# Patient Record
Sex: Female | Born: 1960
Health system: Southern US, Community
[De-identification: ages and names within clinical notes are randomized; demographics above are authoritative.]

## PROBLEM LIST (undated history)

## (undated) DIAGNOSIS — R002 Palpitations: Secondary | ICD-10-CM

## (undated) DIAGNOSIS — R001 Bradycardia, unspecified: Secondary | ICD-10-CM

## (undated) DIAGNOSIS — E785 Hyperlipidemia, unspecified: Secondary | ICD-10-CM

## (undated) DIAGNOSIS — Z9289 Personal history of other medical treatment: Secondary | ICD-10-CM

## (undated) DIAGNOSIS — I10 Essential (primary) hypertension: Secondary | ICD-10-CM

## (undated) HISTORY — DX: Bradycardia, unspecified: R00.1

## (undated) HISTORY — DX: Personal history of other medical treatment: Z92.89

## (undated) HISTORY — DX: Hyperlipidemia, unspecified: E78.5

## (undated) HISTORY — DX: Palpitations: R00.2

## (undated) HISTORY — DX: Essential (primary) hypertension: I10

---

## 1982-05-03 HISTORY — PX: WISDOM TOOTH EXTRACTION: SHX21

## 2011-10-04 ENCOUNTER — Emergency Department: Payer: Self-pay | Admitting: Unknown Physician Specialty

## 2011-10-04 LAB — COMPREHENSIVE METABOLIC PANEL
Albumin: 3.9 g/dL (ref 3.4–5.0)
Alkaline Phosphatase: 90 U/L (ref 50–136)
Anion Gap: 9 (ref 7–16)
Bilirubin,Total: 0.3 mg/dL (ref 0.2–1.0)
Calcium, Total: 9 mg/dL (ref 8.5–10.1)
Chloride: 101 mmol/L (ref 98–107)
Co2: 28 mmol/L (ref 21–32)
EGFR (African American): >60
EGFR (Non-African Amer.): >60
Glucose: 232 mg/dL — ABNORMAL HIGH (ref 65–99)
Potassium: 3.9 mmol/L (ref 3.5–5.1)
SGPT (ALT): 29 U/L
Sodium: 138 mmol/L (ref 136–145)
Total Protein: 7.5 g/dL (ref 6.4–8.2)

## 2011-10-04 LAB — CBC
HCT: 40.6 % (ref 35.0–47.0)
MCH: 30.6 pg (ref 26.0–34.0)
MCHC: 34.6 g/dL (ref 32.0–36.0)
Platelet: 264 10*3/uL (ref 150–440)
RDW: 12.7 % (ref 11.5–14.5)
WBC: 8 10*3/uL (ref 3.6–11.0)

## 2011-10-04 LAB — TROPONIN I: Troponin-I: <0.02 ng/mL

## 2011-10-05 LAB — MAGNESIUM: Magnesium: 1.8 mg/dL

## 2011-10-20 ENCOUNTER — Encounter: Payer: Self-pay | Admitting: Cardiovascular Disease

## 2011-10-20 ENCOUNTER — Encounter: Payer: Self-pay | Admitting: *Deleted

## 2011-10-27 ENCOUNTER — Encounter: Payer: Self-pay | Admitting: Cardiovascular Disease

## 2011-10-27 ENCOUNTER — Ambulatory Visit (INDEPENDENT_AMBULATORY_CARE_PROVIDER_SITE_OTHER): Payer: BC Managed Care – PPO | Admitting: Cardiovascular Disease

## 2011-10-27 VITALS — BP 140/88 | HR 74 | Ht 65.5 in | Wt 178.0 lb

## 2011-10-27 DIAGNOSIS — R002 Palpitations: Secondary | ICD-10-CM

## 2011-10-27 DIAGNOSIS — Z0000 Encounter for general adult medical examination without abnormal findings: Secondary | ICD-10-CM

## 2011-10-27 DIAGNOSIS — E119 Type 2 diabetes mellitus without complications: Secondary | ICD-10-CM | POA: Insufficient documentation

## 2011-10-27 DIAGNOSIS — R079 Chest pain, unspecified: Secondary | ICD-10-CM

## 2011-10-27 DIAGNOSIS — I10 Essential (primary) hypertension: Secondary | ICD-10-CM

## 2011-10-27 MED ORDER — LISINOPRIL 20 MG PO TABS: 20.0000 mg | ORAL_TABLET | Freq: Every day | ORAL | Status: DC

## 2011-10-27 NOTE — Assessment & Plan Note (Signed)
She has several risk factors for coronary artery disease including diabetes, hypertension. Cholesterol unknown but she does have family history. We have discussed the various treatment options with her given her episodes of chest discomfort at the beginning of the month. She has elected to proceed with a routine treadmill study. We will schedule this for her at her convenience.

## 2011-10-27 NOTE — Assessment & Plan Note (Signed)
She denies any recent palpitations. We have asked her to contact our office if she has additional symptoms. Suspect she may have had ectopy.

## 2011-10-27 NOTE — Assessment & Plan Note (Signed)
We have encouraged continued exercise, careful diet management in an effort to lose weight. We have given her a dietary guide to follow.

## 2011-10-27 NOTE — Progress Notes (Signed)
Patient ID: Melanie Navarro, female    DOB: 1960/06/11, 51 y.o.   MRN: [id number]  HPI Comments: Melanie Navarro is a very pleasant 51 year old woman with a history of diabetes that is poorly controlled, hypertension, family history of heart disease with recent symptoms of fatigue, palpitations, hand swelling, episode of chest pain 10/04/2011. She went to the emergency room for evaluation. Blood pressure at that time was in the 200 systolic range.  She has been working closely with Dr. Elease Hashimoto on her blood pressure. She was recently started on lisinopril 10 mg daily metoprolol succinate 25 mg daily. She reports having some fatigue. Blood pressure continues to be mildly elevated by her report though significantly better.  She had symptoms of chest tightness and palpitations in the emergency room at the beginning of the month. She denies any further symptoms since that time. She has been walking on a regular basis around the block several times in succession with no reproducible chest pain symptoms. She reports having trouble with her diabetes. She is trying to watch what she eats.   Details of her family history are not clear but she does report her parents were smokers and have heart problems.  Lab work in the hospital shows normal CBC, normal cardiac enzymes times one, normal renal function, glucose 232, normal LFTs were normal chest x-ray, normal CT scan done for dizziness No lipid panel is available  EKG in the hospital shows normal sinus rhythm with rate 71 beats per minute with no significant ST or T wave changes  EKG today shows normal sinus rhythm with no significant ST or T wave changes     Outpatient Encounter Prescriptions as of 10/27/2011  Medication Sig Dispense Refill  . aspirin 81 MG tablet Take 81 mg by mouth daily.      Marland Kitchen lisinopril (PRINIVIL,ZESTRIL) 10 MG tablet Take 1 tablet (10 mg total) by mouth daily.  30 tablet  11  . metFORMIN (GLUCOPHAGE) 500 MG tablet Take 500 mg by mouth 2  (two) times daily with a meal.      . metoprolol succinate (TOPROL-XL) 25 MG 24 hr tablet Take 25 mg by mouth daily.       Review of Systems  Constitutional: Negative.   HENT: Negative.   Eyes: Negative.   Respiratory: Negative.   Cardiovascular: Positive for chest pain and palpitations.  Gastrointestinal: Negative.   Musculoskeletal: Negative.   Skin: Negative.   Neurological: Negative.   Hematological: Negative.   Psychiatric/Behavioral: Negative.   All other systems reviewed and are negative.    BP 140/88  Pulse 74  Ht 5' 5.5" (1.664 m)  Wt 178 lb (80.74 kg)  BMI 29.17 kg/m2  Physical Exam  Nursing note and vitals reviewed. Constitutional: She is oriented to person, place, and time. She appears well-developed and well-nourished.  HENT:  Head: Normocephalic.  Nose: Nose normal.  Mouth/Throat: Oropharynx is clear and moist.  Eyes: Conjunctivae are normal. Pupils are equal, round, and reactive to light.  Neck: Normal range of motion. Neck supple. No JVD present.  Cardiovascular: Normal rate, regular rhythm, S1 normal, S2 normal, normal heart sounds and intact distal pulses.  Exam reveals no gallop and no friction rub.   No murmur heard. Pulmonary/Chest: Effort normal and breath sounds normal. No respiratory distress. She has no wheezes. She has no rales. She exhibits no tenderness.  Abdominal: Soft. Bowel sounds are normal. She exhibits no distension. There is no tenderness.  Musculoskeletal: Normal range of motion. She exhibits no  edema and no tenderness.  Lymphadenopathy:    She has no cervical adenopathy.  Neurological: She is alert and oriented to person, place, and time. Coordination normal.  Skin: Skin is warm and dry. No rash noted. No erythema.  Psychiatric: She has a normal mood and affect. Her behavior is normal. Judgment and thought content normal.         Assessment and Plan

## 2011-10-27 NOTE — Patient Instructions (Addendum)
You are doing well. Please take lisinopril 20 mg in the AM Take metoprolol in the PM  We will schedule you for a treadmill test for chest tightness, palpitations  Please call us if you have new issues that need to be addressed before your next appt.  Your physician wants you to follow-up in: 12 months.  You will receive a reminder letter in the mail two months in advance. If you don't receive a letter, please call our office to schedule the follow-up appointment.

## 2011-10-27 NOTE — Assessment & Plan Note (Signed)
We discussed weight loss, exercise, strict cholesterol given her diabetes. She is scheduled to see Dr. Elease Hashimoto and we have suggested she have a cholesterol panel on her routine annual visit. Goal LDL less than 70.

## 2011-10-27 NOTE — Assessment & Plan Note (Signed)
Blood pressure is mildly elevated today. We have suggested she increase her lisinopril to 20 mg in the morning, take her metoprolol in the evening given her fatigue which is likely from her metoprolol. She has followup with Dr. Elease Hashimoto in the near future.

## 2011-11-17 ENCOUNTER — Encounter: Payer: BC Managed Care – PPO | Admitting: Cardiovascular Disease

## 2014-07-08 ENCOUNTER — Ambulatory Visit: Payer: Self-pay | Admitting: Nurse Practitioner

## 2014-07-08 ENCOUNTER — Encounter (INDEPENDENT_AMBULATORY_CARE_PROVIDER_SITE_OTHER): Payer: Self-pay

## 2014-07-15 ENCOUNTER — Ambulatory Visit (INDEPENDENT_AMBULATORY_CARE_PROVIDER_SITE_OTHER): Payer: BLUE CROSS/BLUE SHIELD | Admitting: Nurse Practitioner

## 2014-07-15 ENCOUNTER — Encounter: Payer: Self-pay | Admitting: Nurse Practitioner

## 2014-07-15 VITALS — BP 180/90 | HR 83 | Temp 98.1°F | Resp 12 | Ht 65.5 in | Wt 164.8 lb

## 2014-07-15 DIAGNOSIS — I10 Essential (primary) hypertension: Secondary | ICD-10-CM

## 2014-07-15 DIAGNOSIS — Z1239 Encounter for other screening for malignant neoplasm of breast: Secondary | ICD-10-CM

## 2014-07-15 DIAGNOSIS — Z1211 Encounter for screening for malignant neoplasm of colon: Secondary | ICD-10-CM

## 2014-07-15 DIAGNOSIS — E119 Type 2 diabetes mellitus without complications: Secondary | ICD-10-CM

## 2014-07-15 DIAGNOSIS — Z7689 Persons encountering health services in other specified circumstances: Secondary | ICD-10-CM

## 2014-07-15 DIAGNOSIS — Z7189 Other specified counseling: Secondary | ICD-10-CM

## 2014-07-15 LAB — BASIC METABOLIC PANEL
BUN: 15 mg/dL (ref 6–23)
CALCIUM: 9.6 mg/dL (ref 8.4–10.5)
CO2: 30 meq/L (ref 19–32)
CREATININE: 0.55 mg/dL (ref 0.40–1.20)
Chloride: 101 mEq/L (ref 96–112)
GFR: 122.06 mL/min (ref >60.00)
GLUCOSE: 223 mg/dL — AB (ref 70–99)
Potassium: 4.2 mEq/L (ref 3.5–5.1)
Sodium: 137 mEq/L (ref 135–145)

## 2014-07-15 LAB — HEMOGLOBIN A1C: HEMOGLOBIN A1C: 9.2 % — AB (ref 4.6–6.5)

## 2014-07-15 MED ORDER — LOSARTAN POTASSIUM 50 MG PO TABS: 50.0000 mg | ORAL_TABLET | Freq: Every day | ORAL | Status: DC

## 2014-07-15 MED ORDER — CANAGLIFLOZIN 100 MG PO TABS: 100.0000 mg | ORAL_TABLET | Freq: Every day | ORAL | Status: DC

## 2014-07-15 NOTE — Progress Notes (Signed)
Subjective:    Patient ID: Melanie Navarro, female    DOB: 11-Jan-1961, 54 y.o.   MRN: [id number]  HPI  Melanie Navarro is a 54 yo female establishing care today with CC of HTN.  1) Health Maintenance-   Diet- Eats at home, cutting down on salt   Exercise- No formal   Immunizations- UTD  Mammogram- Never  Pap- Unsure, no irregulars in past   Colonoscopy- Never   Eye Exam- 2 years ago, normal   Dental Exam- UTD  2) Chronic Problems-  Chiropractor for back pain- acupuncture, improving, intermittent    2 ibuprofen as needed. Sitting up top of shin and groin area.   Diabetes Type II- Metformin daily,   3) Acute Problems-  HTN- Saw Dr. Venia Minks same medications for awhile and it is not working. Dry cough, machine is broken at home. 2 months ago went to chiropractor and had it checked.    Review of Systems  Constitutional: Negative for fever, chills, diaphoresis, fatigue and unexpected weight change.  HENT: Negative for tinnitus and trouble swallowing.   Eyes: Negative for visual disturbance.  Respiratory: Negative for cough, chest tightness, shortness of breath and wheezing.   Cardiovascular: Negative for chest pain, palpitations and leg swelling.  Gastrointestinal: Negative for nausea, vomiting, abdominal pain, diarrhea, constipation and blood in stool.  Endocrine: Negative for polydipsia, polyphagia and polyuria.  Genitourinary: Negative for dysuria, hematuria, vaginal discharge and vaginal pain.  Musculoskeletal: Positive for back pain. Negative for myalgias, arthralgias and gait problem.  Skin: Negative for color change and rash.  Neurological: Negative for dizziness, weakness, numbness and headaches.  Hematological: Does not bruise/bleed easily.  Psychiatric/Behavioral: Negative for suicidal ideas and sleep disturbance. The patient is not nervous/anxious.    Past Medical History  Diagnosis Date  . Diabetes mellitus   . Hypertension     History   Social History  . Marital  Status: Married    Spouse Name: N/A  . Number of Children: N/A  . Years of Education: N/A   Occupational History  . Not on file.   Social History Main Topics  . Smoking status: Never Smoker   . Smokeless tobacco: Not on file  . Alcohol Use: 0.5 oz/week    1 Standard drinks or equivalent per week     Comment: social  . Drug Use: No  . Sexual Activity: Not on file   Other Topics Concern  . Not on file   Social History Narrative    History reviewed. No pertinent past surgical history.  Family History  Problem Relation Age of Onset  . Heart attack Mother   . Hypertension Mother   . Heart disease Mother   . Diabetes Mother   . Diabetes Father   . Cancer Father     Lung  . Diabetes Brother     Allergies  Allergen Reactions  . Penicillins     Rash   . Sulfur     Rash     Current Outpatient Prescriptions on File Prior to Visit  Medication Sig Dispense Refill  . aspirin 81 MG tablet Take 81 mg by mouth daily.    . metFORMIN (GLUCOPHAGE) 500 MG tablet Take 500 mg by mouth 2 (two) times daily with a meal.    . metoprolol succinate (TOPROL-XL) 25 MG 24 hr tablet Take 25 mg by mouth daily.     No current facility-administered medications on file prior to visit.      Objective:   Physical  Exam  Constitutional: She is oriented to person, place, and time. She appears well-developed and well-nourished. No distress.  BP 180/90 mmHg  Pulse 83  Temp(Src) 98.1 F (36.7 C)  Resp 12  Ht 5' 5.5" (1.664 m)  Wt 164 lb 12.8 oz (74.753 kg)  BMI 27.00 kg/m2  SpO2 97%   HENT:  Head: Normocephalic and atraumatic.  Right Ear: External ear normal.  Left Ear: External ear normal.  Eyes: EOM are normal. Pupils are equal, round, and reactive to light. Right eye exhibits no discharge. Left eye exhibits no discharge. No scleral icterus.  Neck: Normal range of motion. Neck supple. No thyromegaly present.  Cardiovascular: Normal rate, regular rhythm, normal heart sounds and intact  distal pulses.  Exam reveals no gallop and no friction rub.   No murmur heard. Pulmonary/Chest: Effort normal and breath sounds normal. No respiratory distress. She has no wheezes. She has no rales. She exhibits no tenderness.  Musculoskeletal: Normal range of motion. She exhibits no edema or tenderness.  Lymphadenopathy:    She has no cervical adenopathy.  Neurological: She is alert and oriented to person, place, and time. No cranial nerve deficit. She exhibits normal muscle tone. Coordination normal.  Skin: Skin is warm and dry. No rash noted. She is not diaphoretic.  Psychiatric: She has a normal mood and affect. Her behavior is normal. Judgment and thought content normal.      Assessment & Plan:

## 2014-07-15 NOTE — Progress Notes (Signed)
Pre visit review using our clinic review tool, if applicable. No additional management support is needed unless otherwise documented below in the visit note. 

## 2014-07-15 NOTE — Patient Instructions (Addendum)
Take Invokana with first meal of the day (okay to take with Metformin).   Follow up in 3 months on BP and diabetes.  Follow up end of this week beginning of next week for BP (either call it in or come for nurse visit).   Losartan 50 mg at night instead of the lisinopril.   Please visit the lab before leaving today.

## 2014-07-16 DIAGNOSIS — Z7689 Persons encountering health services in other specified circumstances: Secondary | ICD-10-CM | POA: Insufficient documentation

## 2014-07-16 NOTE — Assessment & Plan Note (Signed)
Discussed acute and chronic issues. Reviewed health maintenance measures, PFSHx, and immunizations. Referral to GI for colonoscopy and for mammogram.

## 2014-07-16 NOTE — Assessment & Plan Note (Signed)
Take Invokana with first meal of the day (okay to take with Metformin). Will obtain A1c.

## 2014-07-16 NOTE — Assessment & Plan Note (Signed)
Worsening. Asked pt to start Losartan 50 mg at night instead of lisinopril (losartan lower risk of dry cough). Follow up in 3 months on BP and diabetes. Will obtain BMET today.

## 2014-07-17 ENCOUNTER — Encounter: Payer: Self-pay | Admitting: Nurse Practitioner

## 2014-07-18 ENCOUNTER — Other Ambulatory Visit: Payer: Self-pay | Admitting: Nurse Practitioner

## 2014-07-18 MED ORDER — CYCLOBENZAPRINE HCL 5 MG PO TABS: 5.0000 mg | ORAL_TABLET | Freq: Every day | ORAL | Status: DC

## 2014-08-05 ENCOUNTER — Ambulatory Visit (INDEPENDENT_AMBULATORY_CARE_PROVIDER_SITE_OTHER): Payer: BLUE CROSS/BLUE SHIELD | Admitting: Nurse Practitioner

## 2014-08-05 ENCOUNTER — Encounter: Payer: Self-pay | Admitting: Nurse Practitioner

## 2014-08-05 VITALS — BP 150/90 | HR 90 | Temp 98.0°F | Resp 12 | Ht 65.5 in | Wt 160.4 lb

## 2014-08-05 DIAGNOSIS — M545 Low back pain, unspecified: Secondary | ICD-10-CM | POA: Insufficient documentation

## 2014-08-05 DIAGNOSIS — M5441 Lumbago with sciatica, right side: Secondary | ICD-10-CM | POA: Diagnosis not present

## 2014-08-05 MED ORDER — MELOXICAM 15 MG PO TABS: 15.0000 mg | ORAL_TABLET | Freq: Every day | ORAL | Status: DC

## 2014-08-05 NOTE — Patient Instructions (Signed)
Try the meloxicam daily for 2 weeks. This will help decrease inflammation.   Stop the medication if it is making your stomach upset.   We will contact you with your results of your back x-ray.   [facility] Address:  Address: [address] [place], [place] [address]  [phone] X-rays until 4 pm

## 2014-08-05 NOTE — Progress Notes (Addendum)
Subjective:    Patient ID: Melanie Navarro, female    DOB: 1961-04-11, 54 y.o.   MRN: [id number]  HPI  [name] is a 54 yo female with back pain.   1) Back pain started before Christmas  Groin right side, right buttock, right lower back   Walking often- 3 x a week over a mile   Lying flat was worse, standing   Best position sitting   Dull aching pain   8/10- worst, 3/10   Constant  Flexeril- helpful  Advil- helpful   Review of Systems  Constitutional: Negative for fever, chills, diaphoresis and fatigue.  Musculoskeletal: Positive for back pain. Negative for myalgias, joint swelling, arthralgias, gait problem, neck pain and neck stiffness.   Past Medical History  Diagnosis Date  . Diabetes mellitus   . Hypertension     History   Social History  . Marital Status: Married    Spouse Name: N/A  . Number of Children: N/A  . Years of Education: N/A   Occupational History  . Not on file.   Social History Main Topics  . Smoking status: Never Smoker   . Smokeless tobacco: Not on file  . Alcohol Use: 0.5 oz/week    1 Standard drinks or equivalent per week     Comment: social  . Drug Use: No  . Sexual Activity: Not on file   Other Topics Concern  . Not on file   Social History Narrative    No past surgical history on file.  Family History  Problem Relation Age of Onset  . Heart attack Mother   . Hypertension Mother   . Heart disease Mother   . Diabetes Mother   . Diabetes Father   . Cancer Father     Lung  . Diabetes Brother     Allergies  Allergen Reactions  . Penicillins     Rash   . Sulfur     Rash     Current Outpatient Prescriptions on File Prior to Visit  Medication Sig Dispense Refill  . aspirin 81 MG tablet Take 81 mg by mouth daily.    . canagliflozin (INVOKANA) 100 MG TABS tablet Take 1 tablet (100 mg total) by mouth daily. 30 tablet 2  . cyclobenzaprine (FLEXERIL) 5 MG tablet Take 1 tablet (5 mg total) by mouth at bedtime. 30 tablet 0    . losartan (COZAAR) 50 MG tablet Take 1 tablet (50 mg total) by mouth daily. 30 tablet 2  . metFORMIN (GLUCOPHAGE) 500 MG tablet Take 500 mg by mouth 2 (two) times daily with a meal.    . metoprolol succinate (TOPROL-XL) 25 MG 24 hr tablet Take 25 mg by mouth daily.     No current facility-administered medications on file prior to visit.      Objective:   Physical Exam  Constitutional: She is oriented to person, place, and time. She appears well-developed and well-nourished. No distress.  BP 150/90 mmHg  Pulse 90  Temp(Src) 98 F (36.7 C) (Oral)  Resp 12  Ht 5' 5.5" (1.664 m)  Wt 160 lb 6.4 oz (72.757 kg)  BMI 26.28 kg/m2  SpO2 96%   HENT:  Head: Normocephalic and atraumatic.  Right Ear: External ear normal.  Left Ear: External ear normal.  Cardiovascular: Normal rate, regular rhythm, normal heart sounds and intact distal pulses.  Exam reveals no gallop and no friction rub.   No murmur heard. Pulmonary/Chest: Effort normal and breath sounds normal. No respiratory distress.  She has no wheezes. She has no rales. She exhibits no tenderness.  Musculoskeletal:       Arms: Neurological: She is alert and oriented to person, place, and time. No cranial nerve deficit. She exhibits normal muscle tone. Coordination normal.  Skin: Skin is warm and dry. No rash noted. She is not diaphoretic.  Psychiatric: She has a normal mood and affect. Her behavior is normal. Judgment and thought content normal.      Assessment & Plan:

## 2014-08-05 NOTE — Assessment & Plan Note (Signed)
Right side back pain has moved from outer thigh to groin. Will obtain x-ray. Will try Meloxicam 15 mg daily for 2 weeks. Asked her to stop it if making stomach upset and not to take with other NSAIDs (gave examples). Will follow after x-ray.

## 2014-08-05 NOTE — Progress Notes (Signed)
Pre visit review using our clinic review tool, if applicable. No additional management support is needed unless otherwise documented below in the visit note. 

## 2014-08-12 ENCOUNTER — Ambulatory Visit (INDEPENDENT_AMBULATORY_CARE_PROVIDER_SITE_OTHER): Payer: BLUE CROSS/BLUE SHIELD | Admitting: Internal Medicine

## 2014-08-12 ENCOUNTER — Encounter: Payer: Self-pay | Admitting: Internal Medicine

## 2014-08-12 VITALS — BP 116/72 | HR 100 | Temp 98.6°F | Resp 16 | Ht 65.5 in | Wt 158.0 lb

## 2014-08-12 DIAGNOSIS — J020 Streptococcal pharyngitis: Secondary | ICD-10-CM | POA: Diagnosis not present

## 2014-08-12 DIAGNOSIS — B349 Viral infection, unspecified: Secondary | ICD-10-CM | POA: Diagnosis not present

## 2014-08-12 LAB — POCT RAPID STREP A (OFFICE): RAPID STREP A SCREEN: NEGATIVE

## 2014-08-12 MED ORDER — PREDNISONE 10 MG PO TABS: ORAL_TABLET | ORAL | Status: DC

## 2014-08-12 MED ORDER — LEVOFLOXACIN 500 MG PO TABS: 500.0000 mg | ORAL_TABLET | Freq: Every day | ORAL | Status: DC

## 2014-08-12 NOTE — Progress Notes (Signed)
Patient ID: Melanie Navarro, female   DOB: Apr 24, 1961, 54 y.o.   MRN: [id number]   Patient Active Problem List   Diagnosis Date Noted  . Acute viral syndrome 08/13/2014  . Low back pain 08/05/2014  . Encounter to establish care 07/16/2014  . Chest pain 10/27/2011  . Palpitations 10/27/2011  . Hypertension 10/27/2011  . Encounter for preventive health examination 10/27/2011  . Diabetes mellitus 10/27/2011    Subjective:  CC:   Chief Complaint  Patient presents with  . Cough    X 2 days started on saturday with sorethroat, chills low grade fever patient states.  . Sore Throat  . Sinusitis    sinus drainage causes patient to cough.  . Otalgia    bilateral    HPI:   Melanie Navarro is a 54 y.o. female who presents for Woke up [place], symptoms have goten worse,   COUG H IS NONPRODUCTIVE.  NO SICK CONTACTS EXCEPT 18 month GRANDDAUGHTER HAS HAD A RUNNY NOSE and has been in DAYCARE    Past Medical History  Diagnosis Date  . Diabetes mellitus   . Hypertension     No past surgical history on file.     The following portions of the patient's history were reviewed and updated as appropriate: Allergies, current medications, and problem list.    Review of Systems:   Patient denies headache, fevers, malaise, unintentional weight loss, skin rash, eye pain, sinus congestion and sinus pain, sore throat, dysphagia,  hemoptysis , cough, dyspnea, wheezing, chest pain, palpitations, orthopnea, edema, abdominal pain, nausea, melena, diarrhea, constipation, flank pain, dysuria, hematuria, urinary  Frequency, nocturia, numbness, tingling, seizures,  Focal weakness, Loss of consciousness,  Tremor, insomnia, depression, anxiety, and suicidal ideation.     History   Social History  . Marital Status: Married    Spouse Name: N/A  . Number of Children: N/A  . Years of Education: N/A   Occupational History  . Not on file.   Social History Main Topics  .  Smoking status: Never Smoker   . Smokeless tobacco: Not on file  . Alcohol Use: 0.5 oz/week    1 Standard drinks or equivalent per week     Comment: social  . Drug Use: No  . Sexual Activity: Not on file   Other Topics Concern  . Not on file   Social History Narrative    Objective:  Filed Vitals:   08/12/14 1618  BP: 116/72  Pulse: 100  Temp: 98.6 F (37 C)  Resp: 16     General appearance: alert, cooperative and appears stated age Ears: normal TM's and external ear canals both ears Throat: lips, mucosa, and tongue normal; teeth and gums normal Neck: cervical lymphadenopathy, no carotid bruit, supple, symmetrical, trachea midline and thyroid not enlarged, symmetric, no tenderness/mass/nodules Back: symmetric, no curvature. ROM normal. No CVA tenderness. Lungs: clear to auscultation bilaterally Heart: regular rate and rhythm, S1, S2 normal, no murmur, click, rub or gallop Abdomen: soft, non-tender; bowel sounds normal; no masses,  no organomegaly Pulses: 2+ and symmetric Skin: Skin color, texture, turgor normal. No rashes or lesions Lymph nodes: Cervical lymphadenopathy.  Assessment and Plan:  Acute viral syndrome URI is most likely viral given the mild HEENT  Symptoms , negative strep test,  And normal exam.   I have explained that in viral URIS, an antibiotic will not help the symptoms and will increase the risk of developing diarrhea.,  Continue  oral and nasal decongestants, and tylenol 650 mq 8 hrs for aches and pains,  And will prednisone  taper for inflammation    Updated Medication List Outpatient Encounter Prescriptions as of 08/12/2014  Medication Sig  . aspirin 81 MG tablet Take 81 mg by mouth daily.  . canagliflozin (INVOKANA) 100 MG TABS tablet Take 1 tablet (100 mg total) by mouth daily.  . cyclobenzaprine (FLEXERIL) 5 MG tablet Take 1 tablet (5 mg total) by mouth at bedtime.  Marland Kitchen losartan (COZAAR) 50 MG tablet Take 1 tablet (50 mg total) by mouth daily.   . meloxicam (MOBIC) 15 MG tablet Take 1 tablet (15 mg total) by mouth daily.  . metFORMIN (GLUCOPHAGE) 500 MG tablet Take 500 mg by mouth 2 (two) times daily with a meal.  . metoprolol succinate (TOPROL-XL) 25 MG 24 hr tablet Take 25 mg by mouth daily.  Marland Kitchen levofloxacin (LEVAQUIN) 500 MG tablet Take 1 tablet (500 mg total) by mouth daily.  . predniSONE (DELTASONE) 10 MG tablet 6 tablets on Day 1 , then reduce by 1 tablet daily until gone     Orders Placed This Encounter  Procedures  . POCT rapid strep A    No Follow-up on file.

## 2014-08-12 NOTE — Patient Instructions (Signed)
You have a viral syndrome which is causing your sore throat and  bronchitis.    The post nasal drip may alsoe be contributing  I am prescribing a prednisone taper to manage the inflammation   I also advise use of the following OTC meds to help with your other symptoms.   Take generic OTC benadryl 25 mg  At bedtime for the drainage,,  Delsym for daytime cough.  lush your sinuses once or twice twice daily with Milta Deiters med Sinus rinse   If the coughing has not improved in  48 hours  OR  if you develop T > 100.4,  Green nasal discharge,  Or facial pain, start the antibiotic. (Levaquin)  Please take a probiotic ( Align, Floraque or Culturelle) while you are on the antibiotic to prevent a serious antibiotic associated diarrhea  Called clostridium dificile colitis and a vaginal yeast infection

## 2014-08-12 NOTE — Progress Notes (Signed)
Pre-visit discussion using our clinic review tool. No additional management support is needed unless otherwise documented below in the visit note.  

## 2014-08-13 DIAGNOSIS — B349 Viral infection, unspecified: Secondary | ICD-10-CM | POA: Insufficient documentation

## 2014-08-13 NOTE — Assessment & Plan Note (Addendum)
URI is most likely viral given the mild HEENT  Symptoms , negative strep test,  And normal exam.   I have explained that in viral URIS, an antibiotic will not help the symptoms and will increase the risk of developing diarrhea.,  Continue oral and nasal decongestants, and tylenol 650 mq 8 hrs for aches and pains,  And will prednisone  taper for inflammation

## 2014-08-19 ENCOUNTER — Encounter: Payer: Self-pay | Admitting: Nurse Practitioner

## 2014-08-19 ENCOUNTER — Ambulatory Visit (INDEPENDENT_AMBULATORY_CARE_PROVIDER_SITE_OTHER): Payer: BLUE CROSS/BLUE SHIELD | Admitting: Nurse Practitioner

## 2014-08-19 VITALS — BP 130/78 | HR 88 | Temp 97.7°F | Resp 14 | Ht 65.5 in | Wt 154.4 lb

## 2014-08-19 DIAGNOSIS — J069 Acute upper respiratory infection, unspecified: Secondary | ICD-10-CM

## 2014-08-19 MED ORDER — LEVOFLOXACIN 500 MG PO TABS: 500.0000 mg | ORAL_TABLET | Freq: Every day | ORAL | Status: DC

## 2014-08-19 NOTE — Progress Notes (Signed)
Pre visit review using our clinic review tool, if applicable. No additional management support is needed unless otherwise documented below in the visit note. 

## 2014-08-19 NOTE — Progress Notes (Signed)
   Subjective:    Patient ID: Melanie Navarro, female    DOB: 07-03-1960, 54 y.o.   MRN: [id number]  HPI  Melanie Navarro is a 54 yo female with a CC of recurring cough, SOB, and nasal drainage.   1) Clear rhinorrhea, chest tightness worse at night, prednisone finished yesterday helped with the chest tightness and ears. Improved, but still coughing. Cough started 08/09/14.   Delsym- somewhat helpful  Prednisone- Somewhat helpful   Review of Systems  Constitutional: Negative for fever, chills, diaphoresis and fatigue.  HENT: Positive for rhinorrhea. Negative for congestion, ear pain, postnasal drip and sinus pressure.   Respiratory: Positive for cough. Negative for chest tightness, shortness of breath and wheezing.   Cardiovascular: Negative for chest pain, palpitations and leg swelling.  Gastrointestinal: Negative for nausea, vomiting and diarrhea.  Skin: Negative for rash.  Neurological: Negative for dizziness, weakness, numbness and headaches.  Psychiatric/Behavioral: The patient is not nervous/anxious.       Objective:   Physical Exam  Constitutional: She is oriented to person, place, and time. She appears well-developed and well-nourished. No distress.  BP 130/78 mmHg  Pulse 88  Temp(Src) 97.7 F (36.5 C) (Oral)  Resp 14  Ht 5' 5.5" (1.664 m)  Wt 154 lb 6.4 oz (70.035 kg)  BMI 25.29 kg/m2  SpO2 95%   HENT:  Head: Normocephalic and atraumatic.  Right Ear: External ear normal.  Left Ear: External ear normal.  Eyes: Pupils are equal, round, and reactive to light. Right eye exhibits no discharge. Left eye exhibits no discharge. No scleral icterus.  Neck: Normal range of motion. Neck supple.  Cardiovascular: Normal rate and regular rhythm.   Pulmonary/Chest: Effort normal. No respiratory distress. She has wheezes. She has no rales. She exhibits no tenderness.  Lymphadenopathy:    She has no cervical adenopathy.  Neurological: She is alert and oriented to person, place, and time.  Coordination normal.  Skin: Skin is warm and dry. No rash noted. She is not diaphoretic.  Psychiatric: She has a normal mood and affect. Her behavior is normal. Judgment and thought content normal.       Assessment & Plan:

## 2014-08-19 NOTE — Patient Instructions (Addendum)
Sent in [place] to pharmacy.   Please take a probiotic ( Align, Floraque or Culturelle) while you are on the antibiotic to prevent a serious antibiotic associated diarrhea  Called clostirudium dificile colitis and a vaginal yeast infection. Generic version is fine!

## 2014-08-19 NOTE — Assessment & Plan Note (Signed)
Seen by Dr. Derrel Nip on 08/12/14. Given prednisone taper and levaquin printed script in case she saw green sputum. Pt reports prednisone was somewhat helpful, but the cough persisted. Delsym helps some, but not improving. Asked her to start Levaquin she was given since she had wheezes on exam. She asked if I could call in script and I sent one by e-script. Asked her about inhaler and she would like to try abx first. Encouraged probiotic use and gave her a few names to look for. FU prn worsening/failure to improve.

## 2014-10-11 ENCOUNTER — Other Ambulatory Visit: Payer: Self-pay | Admitting: Nurse Practitioner

## 2014-10-15 ENCOUNTER — Ambulatory Visit: Payer: BLUE CROSS/BLUE SHIELD | Admitting: Nurse Practitioner

## 2014-10-23 ENCOUNTER — Other Ambulatory Visit: Payer: Self-pay | Admitting: Nurse Practitioner

## 2014-12-21 ENCOUNTER — Other Ambulatory Visit: Payer: Self-pay | Admitting: Family Medicine

## 2014-12-21 DIAGNOSIS — E119 Type 2 diabetes mellitus without complications: Secondary | ICD-10-CM

## 2014-12-23 NOTE — Telephone Encounter (Signed)
Last OV 08/2013 

## 2015-01-02 ENCOUNTER — Other Ambulatory Visit: Payer: Self-pay | Admitting: Family Medicine

## 2015-01-02 DIAGNOSIS — E119 Type 2 diabetes mellitus without complications: Secondary | ICD-10-CM

## 2015-01-02 NOTE — Telephone Encounter (Signed)
Pt has not been here since 08/2013;  She will need to make a follow up appointment.    LMCTB 01/02/2015  Thanks,   -Mickel Baas

## 2015-01-15 ENCOUNTER — Other Ambulatory Visit: Payer: Self-pay | Admitting: *Deleted

## 2015-01-15 DIAGNOSIS — E119 Type 2 diabetes mellitus without complications: Secondary | ICD-10-CM

## 2015-01-15 MED ORDER — METFORMIN HCL 1000 MG PO TABS: ORAL_TABLET | ORAL | Status: DC

## 2015-01-23 ENCOUNTER — Ambulatory Visit: Payer: BLUE CROSS/BLUE SHIELD | Admitting: Nurse Practitioner

## 2015-01-27 ENCOUNTER — Other Ambulatory Visit: Payer: Self-pay | Admitting: Nurse Practitioner

## 2015-01-28 ENCOUNTER — Ambulatory Visit (INDEPENDENT_AMBULATORY_CARE_PROVIDER_SITE_OTHER): Payer: BLUE CROSS/BLUE SHIELD | Admitting: Nurse Practitioner

## 2015-01-28 ENCOUNTER — Encounter: Payer: Self-pay | Admitting: Nurse Practitioner

## 2015-01-28 VITALS — BP 160/80 | HR 79 | Temp 97.7°F | Resp 16 | Ht 66.0 in | Wt 164.6 lb

## 2015-01-28 DIAGNOSIS — M5441 Lumbago with sciatica, right side: Secondary | ICD-10-CM | POA: Diagnosis not present

## 2015-01-28 DIAGNOSIS — E119 Type 2 diabetes mellitus without complications: Secondary | ICD-10-CM

## 2015-01-28 DIAGNOSIS — Z23 Encounter for immunization: Secondary | ICD-10-CM | POA: Diagnosis not present

## 2015-01-28 DIAGNOSIS — I10 Essential (primary) hypertension: Secondary | ICD-10-CM | POA: Diagnosis not present

## 2015-01-28 LAB — COMPREHENSIVE METABOLIC PANEL
ALT: 17 U/L (ref 0–35)
AST: 16 U/L (ref 0–37)
Albumin: 4.2 g/dL (ref 3.5–5.2)
Alkaline Phosphatase: 67 U/L (ref 39–117)
BILIRUBIN TOTAL: 0.5 mg/dL (ref 0.2–1.2)
BUN: 17 mg/dL (ref 6–23)
CHLORIDE: 103 meq/L (ref 96–112)
CO2: 26 meq/L (ref 19–32)
Calcium: 9.3 mg/dL (ref 8.4–10.5)
Creatinine, Ser: 0.53 mg/dL (ref 0.40–1.20)
GFR: 127.14 mL/min (ref >60.00)
GLUCOSE: 175 mg/dL — AB (ref 70–99)
Potassium: 4 mEq/L (ref 3.5–5.1)
Sodium: 142 mEq/L (ref 135–145)
Total Protein: 6.6 g/dL (ref 6.0–8.3)

## 2015-01-28 LAB — MICROALBUMIN / CREATININE URINE RATIO
CREATININE, U: 34.5 mg/dL
MICROALB/CREAT RATIO: 2 mg/g (ref 0.0–30.0)
Microalb, Ur: <0.7 mg/dL (ref 0.0–1.9)

## 2015-01-28 LAB — HEMOGLOBIN A1C: Hgb A1c MFr Bld: 8.2 % — ABNORMAL HIGH (ref 4.6–6.5)

## 2015-01-28 MED ORDER — METFORMIN HCL 1000 MG PO TABS: 1000.0000 mg | ORAL_TABLET | Freq: Two times a day (BID) | ORAL | Status: DC

## 2015-01-28 MED ORDER — CYCLOBENZAPRINE HCL 5 MG PO TABS: 5.0000 mg | ORAL_TABLET | Freq: Every day | ORAL | Status: DC

## 2015-01-28 NOTE — Patient Instructions (Addendum)
Let us know if the flexeril is helpful for your back. X-ray order is standing and can be done M-F 8-5 pm at [facility].   Thanks for getting your pneumonia vaccine!

## 2015-01-28 NOTE — Progress Notes (Signed)
Patient ID: Melanie Navarro, female    DOB: 04-16-1961  Age: 54 y.o. MRN: [id number]  CC: Follow-up   HPI Melanie Navarro presents for follow up of back pain.   1) Right side in April. Seen and given meloxicam, x-ray- Not exercising in last week  Right side- without sciatica, thigh feels slightly weak and tight   2) Invokana- no sign of UTIs of yeast infection, lost approx 7 lbs then gained some of it back. Father in law passed within past month.   History Melanie Navarro has a past medical history of Diabetes mellitus and Hypertension.   She has no past surgical history on file.   Her family history includes Cancer in her father; Diabetes in her brother, father, and mother; Heart attack in her mother; Heart disease in her mother; Hypertension in her mother.She reports that she has never smoked. She does not have any smokeless tobacco history on file. She reports that she drinks about 0.5 oz of alcohol per week. She reports that she does not use illicit drugs.  Outpatient Prescriptions Prior to Visit  Medication Sig Dispense Refill  . aspirin 81 MG tablet Take 81 mg by mouth daily.    . INVOKANA 100 MG TABS tablet TAKE 1 TABLET BY MOUTH DAILY 30 tablet 5  . losartan (COZAAR) 50 MG tablet TAKE 1 TABLET BY MOUTH DAILY. 30 tablet 5  . metoprolol succinate (TOPROL-XL) 25 MG 24 hr tablet Take 25 mg by mouth daily.    . cyclobenzaprine (FLEXERIL) 5 MG tablet Take 1 tablet (5 mg total) by mouth at bedtime. 30 tablet 0  . metFORMIN (GLUCOPHAGE) 1000 MG tablet TAKE 1 TABLET BY MOUTH TWICE A DAY 30 tablet 3  . metFORMIN (GLUCOPHAGE) 500 MG tablet Take 500 mg by mouth 2 (two) times daily with a meal.    . levofloxacin (LEVAQUIN) 500 MG tablet Take 1 tablet (500 mg total) by mouth daily. (Patient not taking: Reported on 01/28/2015) 7 tablet 0  . meloxicam (MOBIC) 15 MG tablet Take 1 tablet (15 mg total) by mouth daily. (Patient not taking: Reported on 01/28/2015) 30 tablet 1   No facility-administered  medications prior to visit.    ROS Review of Systems  Constitutional: Negative for fever, chills, diaphoresis and fatigue.  Respiratory: Negative for chest tightness, shortness of breath and wheezing.   Cardiovascular: Negative for chest pain, palpitations and leg swelling.  Gastrointestinal: Negative for nausea, vomiting and diarrhea.  Endocrine: Negative for polydipsia, polyphagia and polyuria.  Genitourinary: Negative for dysuria, vaginal discharge and vaginal pain.  Musculoskeletal: Positive for myalgias and back pain.  Skin: Negative for rash.  Neurological: Negative for dizziness, weakness, numbness and headaches.  Psychiatric/Behavioral: The patient is not nervous/anxious.     Objective:  BP 160/80 mmHg  Pulse 79  Temp(Src) 97.7 F (36.5 C)  Resp 16  Ht 5\' 6"  (1.676 m)  Wt 164 lb 9.6 oz (74.662 kg)  BMI 26.58 kg/m2  SpO2 99%  Physical Exam  Constitutional: She is oriented to person, place, and time. She appears well-developed and well-nourished. No distress.  HENT:  Head: Normocephalic and atraumatic.  Right Ear: External ear normal.  Left Ear: External ear normal.  Cardiovascular: Normal rate, regular rhythm and normal heart sounds.   Pulmonary/Chest: Effort normal and breath sounds normal. No respiratory distress. She has no wheezes. She has no rales. She exhibits no tenderness.  Musculoskeletal: She exhibits tenderness. She exhibits no edema.  Right hip and buttock tenderness  Neurological: She  is alert and oriented to person, place, and time. No cranial nerve deficit. She exhibits normal muscle tone. Coordination normal.  Iliopsoas 5/5 Bilateral, Tib anterior 5/5 bilateral, EHL 5/5 bilateral, no ankle clonus, intact heel/toe/sequential walking, sensation intact upper and lower extremities. Straight leg raise negative bilaterally.   Skin: Skin is warm and dry. No rash noted. She is not diaphoretic.  Psychiatric: She has a normal mood and affect. Her behavior is  normal. Judgment and thought content normal.   Assessment & Plan:   Melanie Navarro was seen today for follow-up.  Diagnoses and all orders for this visit:  Type 2 diabetes mellitus without complication -     Comprehensive metabolic panel -     Hemoglobin A1c -     Urine Microalbumin w/creat. ratio  Essential hypertension -     Comprehensive metabolic panel  Need for [id number] pneumococcal polysaccharide vaccine  Right-sided low back pain with right-sided sciatica  Other orders -     cyclobenzaprine (FLEXERIL) 5 MG tablet; Take 1 tablet (5 mg total) by mouth at bedtime. -     metFORMIN (GLUCOPHAGE) 1000 MG tablet; Take 1 tablet (1,000 mg total) by mouth 2 (two) times daily. -     Pneumococcal polysaccharide vaccine 23-valent greater than or equal to 2yo subcutaneous/IM  I have discontinued [name]'s metFORMIN, meloxicam, and levofloxacin. I have also changed her metFORMIN. Additionally, I am having her maintain her metoprolol succinate, aspirin, losartan, INVOKANA, and cyclobenzaprine.  Meds ordered this encounter  Medications  . cyclobenzaprine (FLEXERIL) 5 MG tablet    Sig: Take 1 tablet (5 mg total) by mouth at bedtime.    Dispense:  30 tablet    Refill:  0    Order Specific Question:  Supervising Provider    Answer:  Deborra Medina L [[id number]]  . metFORMIN (GLUCOPHAGE) 1000 MG tablet    Sig: Take 1 tablet (1,000 mg total) by mouth 2 (two) times daily.    Dispense:  60 tablet    Refill:  2    Order Specific Question:  Supervising Provider    Answer:  Crecencio Mc [[id number]]     Follow-up: Return in about 6 months (around 07/28/2015) for Follow up.

## 2015-01-28 NOTE — Assessment & Plan Note (Addendum)
Pt reports that she has been feeling improved since starting the invokana. Denies urinary or vaginal symptoms. Pt reports she has not been working on diet and exercise like she usually does due to recent death of father in law and being there for her husband.  Pneumovax given today

## 2015-01-28 NOTE — Assessment & Plan Note (Signed)
Right side back pain. Denies sciatica, but there is is feeling of tightness/perceived weakness of right thigh area. Neurologically intact. Will refill Flexeril. Asked her to call if no improvement.

## 2015-02-03 ENCOUNTER — Other Ambulatory Visit: Payer: Self-pay

## 2015-02-03 MED ORDER — LOSARTAN POTASSIUM 50 MG PO TABS: 50.0000 mg | ORAL_TABLET | Freq: Every day | ORAL | Status: DC

## 2015-05-07 ENCOUNTER — Encounter (INDEPENDENT_AMBULATORY_CARE_PROVIDER_SITE_OTHER): Payer: Self-pay

## 2015-05-07 ENCOUNTER — Ambulatory Visit (INDEPENDENT_AMBULATORY_CARE_PROVIDER_SITE_OTHER): Payer: BLUE CROSS/BLUE SHIELD | Admitting: Family Medicine

## 2015-05-07 ENCOUNTER — Encounter: Payer: Self-pay | Admitting: Family Medicine

## 2015-05-07 VITALS — BP 132/80 | HR 88 | Temp 98.0°F | Ht 66.0 in | Wt 162.6 lb

## 2015-05-07 DIAGNOSIS — J0100 Acute maxillary sinusitis, unspecified: Secondary | ICD-10-CM

## 2015-05-07 MED ORDER — BENZONATATE 200 MG PO CAPS: 200.0000 mg | ORAL_CAPSULE | Freq: Two times a day (BID) | ORAL | Status: DC | PRN

## 2015-05-07 MED ORDER — DOXYCYCLINE HYCLATE 100 MG PO TABS: 100.0000 mg | ORAL_TABLET | Freq: Two times a day (BID) | ORAL | Status: DC

## 2015-05-07 NOTE — Patient Instructions (Signed)
Nice to see you.  You have a sinus infection. We will treat this with doxycycline given her penicillin allergy. If you develop fever, chest pain, shortness of breath, cough productive of blood , or any new or change in symptoms please seek medical attention.

## 2015-05-07 NOTE — Progress Notes (Signed)
Patient ID: Melanie Navarro, female   DOB: [birth year], 55 y.o.   MRN: [id number]  Tommi Rumps, MD Phone: [phone]  Melanie Navarro is a 55 y.o. female who presents today for same-day visit.  Patient notes 2 weeks of rhinorrhea, nasal congestion, sinus pressure and teeth throbbing, and chest congestion. Notes she started to cough some as well. She denies fever, shortness of breath, and chest pain. Has tried Benadryl and Zicam with no benefit. No history of sinus infection. Notes the whole house is been sick with similar symptoms, though there symptoms improved after about a week.  PMH: nonsmoker.   ROS see history of present illness  Objective  Physical Exam Filed Vitals:   05/07/15 1434  BP: 132/80  Pulse: 88  Temp: 98 F (36.7 C)    BP Readings from Last 3 Encounters:  05/07/15 132/80  01/28/15 160/80  08/19/14 130/78   Wt Readings from Last 3 Encounters:  05/07/15 162 lb 9.6 oz (73.755 kg)  01/28/15 164 lb 9.6 oz (74.662 kg)  08/19/14 154 lb 6.4 oz (70.035 kg)    Physical Exam  Constitutional: She is well-developed, well-nourished, and in no distress.  HENT:  Head: Normocephalic and atraumatic.  Right Ear: External ear normal.  Left Ear: External ear normal.  Mouth/Throat: Oropharynx is clear and moist. No oropharyngeal exudate.  Normal TMs bilaterally, bilateral maxillary sinuses tender to percussion  Eyes: Conjunctivae are normal. Pupils are equal, round, and reactive to light.  Neck: Neck supple.  Cardiovascular: Normal rate, regular rhythm and normal heart sounds.  Exam reveals no gallop and no friction rub.   No murmur heard. Pulmonary/Chest: Effort normal and breath sounds normal. No respiratory distress. She has no wheezes. She has no rales.  Lymphadenopathy:    She has no cervical adenopathy.  Neurological: She is alert. Gait normal.  Skin: Skin is warm and dry. She is not diaphoretic.     Assessment/Plan: Please see individual problem  list.  Sinusitis, acute maxillary Symptoms, history, and exam most consistent with bacterial sinusitis. Stable vital signs. Benign lung exam. We will treat her with doxycycline and Tessalon. She's given return precautions.    No orders of the defined types were placed in this encounter.    Meds ordered this encounter  Medications  . doxycycline (VIBRA-TABS) 100 MG tablet    Sig: Take 1 tablet (100 mg total) by mouth 2 (two) times daily.    Dispense:  14 tablet    Refill:  0  . benzonatate (TESSALON) 200 MG capsule    Sig: Take 1 capsule (200 mg total) by mouth 2 (two) times daily as needed for cough.    Dispense:  20 capsule    Refill:  0    Dragon voice recognition software was used during the dictation process of this note. If any phrases or words seem inappropriate it is likely secondary to the translation process being inefficient.  Tommi Rumps

## 2015-05-07 NOTE — Progress Notes (Signed)
Pre visit review using our clinic review tool, if applicable. No additional management support is needed unless otherwise documented below in the visit note. 

## 2015-05-07 NOTE — Assessment & Plan Note (Addendum)
Symptoms, history, and exam most consistent with bacterial sinusitis. Stable vital signs. Benign lung exam. We will treat her with doxycycline and Tessalon. She's given return precautions.

## 2015-05-27 ENCOUNTER — Other Ambulatory Visit: Payer: Self-pay | Admitting: Family Medicine

## 2015-05-27 DIAGNOSIS — I10 Essential (primary) hypertension: Secondary | ICD-10-CM

## 2015-05-27 MED ORDER — METOPROLOL SUCCINATE ER 25 MG PO TB24: 25.0000 mg | ORAL_TABLET | Freq: Every day | ORAL | Status: DC

## 2015-05-29 ENCOUNTER — Other Ambulatory Visit: Payer: Self-pay

## 2015-05-29 MED ORDER — CANAGLIFLOZIN 100 MG PO TABS: 100.0000 mg | ORAL_TABLET | Freq: Every day | ORAL | Status: DC

## 2015-05-29 MED ORDER — METFORMIN HCL 1000 MG PO TABS: 1000.0000 mg | ORAL_TABLET | Freq: Two times a day (BID) | ORAL | Status: DC

## 2015-05-29 MED ORDER — METOPROLOL SUCCINATE ER 25 MG PO TB24: 25.0000 mg | ORAL_TABLET | Freq: Every day | ORAL | Status: DC

## 2015-06-09 ENCOUNTER — Other Ambulatory Visit: Payer: Self-pay | Admitting: Nurse Practitioner

## 2015-06-09 ENCOUNTER — Telehealth: Payer: Self-pay

## 2015-06-09 ENCOUNTER — Ambulatory Visit (INDEPENDENT_AMBULATORY_CARE_PROVIDER_SITE_OTHER): Payer: BLUE CROSS/BLUE SHIELD | Admitting: Nurse Practitioner

## 2015-06-09 VITALS — BP 138/74 | HR 74 | Resp 14 | Ht 66.0 in | Wt 158.8 lb

## 2015-06-09 DIAGNOSIS — E119 Type 2 diabetes mellitus without complications: Secondary | ICD-10-CM

## 2015-06-09 LAB — HEMOGLOBIN A1C: Hgb A1c MFr Bld: 8.5 % — ABNORMAL HIGH (ref 4.6–6.5)

## 2015-06-09 MED ORDER — GLUCOSE BLOOD VI STRP: ORAL_STRIP | Status: DC

## 2015-06-09 MED ORDER — ONETOUCH ULTRASOFT LANCETS MISC: Status: AC

## 2015-06-09 NOTE — Progress Notes (Signed)
Patient ID: Melanie Navarro, female    DOB: May 14, 1960  Age: 55 y.o. MRN: [id number]  CC: Follow-up   HPI Melanie Navarro presents for CC of follow-up of diabetes.  1) Invokana- will fax something to contact insurance (PA?)  Has been taking 100 mg   Fatigue started going to gym 2 days a week last week and this has helped.  Not checking BS - Gave 1 touch verio flex from office, pt is aware of how to use Lancets and strips sent to pharmacy to continually check. Asked her to do so once daily  History Melanie Navarro has a past medical history of Diabetes mellitus and Hypertension.   She has no past surgical history on file.   Her family history includes Cancer in her father; Diabetes in her brother, father, and mother; Heart attack in her mother; Heart disease in her mother; Hypertension in her mother.She reports that she has never smoked. She does not have any smokeless tobacco history on file. She reports that she drinks about 0.5 oz of alcohol per week. She reports that she does not use illicit drugs.  Outpatient Prescriptions Prior to Visit  Medication Sig Dispense Refill  . aspirin 81 MG tablet Take 81 mg by mouth daily.    . cyclobenzaprine (FLEXERIL) 5 MG tablet Take 1 tablet (5 mg total) by mouth at bedtime. 30 tablet 0  . losartan (COZAAR) 50 MG tablet Take 1 tablet (50 mg total) by mouth daily. 30 tablet 5  . metFORMIN (GLUCOPHAGE) 1000 MG tablet Take 1 tablet (1,000 mg total) by mouth 2 (two) times daily. 60 tablet 0  . metoprolol succinate (TOPROL-XL) 25 MG 24 hr tablet Take 1 tablet (25 mg total) by mouth daily. 30 tablet 0  . canagliflozin (INVOKANA) 100 MG TABS tablet Take 1 tablet (100 mg total) by mouth daily. 30 tablet 0  . benzonatate (TESSALON) 200 MG capsule Take 1 capsule (200 mg total) by mouth 2 (two) times daily as needed for cough. (Patient not taking: Reported on 06/09/2015) 20 capsule 0  . doxycycline (VIBRA-TABS) 100 MG tablet Take 1 tablet (100 mg total) by mouth 2 (two)  times daily. (Patient not taking: Reported on 06/09/2015) 14 tablet 0  . INVOKANA 100 MG TABS tablet TAKE 1 TABLET BY MOUTH DAILY 30 tablet 5   No facility-administered medications prior to visit.    ROS Review of Systems  Constitutional: Negative for fever, chills, diaphoresis and fatigue.  Respiratory: Negative for chest tightness, shortness of breath and wheezing.   Cardiovascular: Negative for chest pain, palpitations and leg swelling.  Gastrointestinal: Negative for nausea, vomiting and diarrhea.  Endocrine: Negative for polydipsia, polyphagia and polyuria.  Skin: Negative for rash.  Neurological: Negative for dizziness, weakness, numbness and headaches.  Psychiatric/Behavioral: The patient is not nervous/anxious.     Objective:  BP 138/74 mmHg  Pulse 74  Resp 14  Ht 5\' 6"  (1.676 m)  Wt 158 lb 12 oz (72.009 kg)  BMI 25.64 kg/m2  SpO2 98%  Physical Exam  Constitutional: She is oriented to person, place, and time. She appears well-developed and well-nourished. No distress.  HENT:  Head: Normocephalic and atraumatic.  Right Ear: External ear normal.  Left Ear: External ear normal.  Cardiovascular: Normal rate, regular rhythm and normal heart sounds.  Exam reveals no gallop and no friction rub.   No murmur heard. Pulmonary/Chest: Effort normal and breath sounds normal. No respiratory distress. She has no wheezes. She has no rales. She exhibits no  tenderness.  Neurological: She is alert and oriented to person, place, and time. No cranial nerve deficit. She exhibits normal muscle tone. Coordination normal.  Skin: Skin is warm and dry. No rash noted. She is not diaphoretic.  Psychiatric: She has a normal mood and affect. Her behavior is normal. Judgment and thought content normal.      Assessment & Plan:   Melanie Navarro was seen today for follow-up.  Diagnoses and all orders for this visit:  Type 2 diabetes mellitus without complication, without long-term current use of insulin  (HCC) -     HgB A1c  Other orders -     glucose blood test strip; Use as instructed -     Lancets (ONETOUCH ULTRASOFT) lancets; Use as instructed   I have discontinued Melanie Navarro's doxycycline, benzonatate, canagliflozin, and INVOKANA. I am also having her start on glucose blood and onetouch ultrasoft. Additionally, I am having her maintain her aspirin, cyclobenzaprine, losartan, metFORMIN, and metoprolol succinate.  Meds ordered this encounter  Medications  . glucose blood test strip    Sig: Use as instructed    Dispense:  30 each    Refill:  1    One Touch Verio Flex    Order Specific Question:  Supervising Provider    Answer:  Deborra Medina L [[id number]]  . Lancets (ONETOUCH ULTRASOFT) lancets    Sig: Use as instructed    Dispense:  30 each    Refill:  1    One touch verio flex    Order Specific Question:  Supervising Provider    Answer:  Crecencio Mc [[id number]]     Follow-up: Return in about 3 months (around 09/06/2015) for Follow up.

## 2015-06-09 NOTE — Patient Instructions (Addendum)
We will find out what we need to do with your Invokana- may change :)   I sent in lancets and strips for when you need them. Check once daily

## 2015-06-09 NOTE — Telephone Encounter (Signed)
Submitted a PA for Invokana on Cover my meds.

## 2015-06-14 ENCOUNTER — Encounter: Payer: Self-pay | Admitting: Nurse Practitioner

## 2015-06-14 NOTE — Assessment & Plan Note (Signed)
PA denied for Invokana Need to discuss another medication to add to her Metformin  Gave One Touch Verio Flex meter with additional strips and lancets sent in.  Advised her to work on diet and exercise.

## 2015-06-16 ENCOUNTER — Telehealth: Payer: Self-pay | Admitting: Nurse Practitioner

## 2015-06-16 NOTE — Telephone Encounter (Signed)
Spoke with pt. She will contact her Insurance to see which of the SGLT2 inhibitors and or DPP-4's are covered.

## 2015-06-16 NOTE — Telephone Encounter (Signed)
Left message to call back Please give her lab results if she calls back: A1c is slightly up 8.5% this time. Invokana was denied.  We need to discuss options for her to try instead of Invokana- Januvia is a tablet that is well tolerated. We can try this if she is willing.

## 2015-06-19 NOTE — Telephone Encounter (Signed)
If you read my telephone note from 06/16/15 I have spoken to the patient about this and she is talking with her insurance company. I told her about Jardiance. Thanks!

## 2015-06-19 NOTE — Telephone Encounter (Signed)
PA for Melanie Navarro has been denied.  Please advise?  States there is no diagnosis that is provided and proof that alternatives have been tried [place] or [place])

## 2015-06-24 ENCOUNTER — Other Ambulatory Visit: Payer: Self-pay | Admitting: Nurse Practitioner

## 2015-07-01 ENCOUNTER — Other Ambulatory Visit: Payer: Self-pay | Admitting: Nurse Practitioner

## 2015-07-02 ENCOUNTER — Other Ambulatory Visit: Payer: Self-pay | Admitting: Nurse Practitioner

## 2015-09-08 ENCOUNTER — Ambulatory Visit: Payer: BLUE CROSS/BLUE SHIELD | Admitting: Nurse Practitioner

## 2015-09-24 ENCOUNTER — Telehealth: Payer: Self-pay | Admitting: *Deleted

## 2015-09-24 MED ORDER — LOSARTAN POTASSIUM 50 MG PO TABS: 50.0000 mg | ORAL_TABLET | Freq: Every day | ORAL | Status: DC

## 2015-09-24 NOTE — Telephone Encounter (Signed)
[facility] [address], has requested a medication refill for losartan

## 2015-09-25 ENCOUNTER — Ambulatory Visit (INDEPENDENT_AMBULATORY_CARE_PROVIDER_SITE_OTHER): Payer: BLUE CROSS/BLUE SHIELD | Admitting: Primary Care

## 2015-09-25 ENCOUNTER — Other Ambulatory Visit: Payer: Self-pay | Admitting: Primary Care

## 2015-09-25 ENCOUNTER — Encounter: Payer: Self-pay | Admitting: Primary Care

## 2015-09-25 VITALS — HR 86 | Temp 98.0°F | Ht 66.0 in | Wt 170.0 lb

## 2015-09-25 DIAGNOSIS — L0291 Cutaneous abscess, unspecified: Secondary | ICD-10-CM

## 2015-09-25 DIAGNOSIS — I10 Essential (primary) hypertension: Secondary | ICD-10-CM | POA: Diagnosis not present

## 2015-09-25 DIAGNOSIS — E119 Type 2 diabetes mellitus without complications: Secondary | ICD-10-CM | POA: Diagnosis not present

## 2015-09-25 DIAGNOSIS — L0390 Cellulitis, unspecified: Secondary | ICD-10-CM | POA: Diagnosis not present

## 2015-09-25 LAB — HEMOGLOBIN A1C: HEMOGLOBIN A1C: 10 % — AB (ref 4.6–6.5)

## 2015-09-25 MED ORDER — SULFAMETHOXAZOLE-TRIMETHOPRIM 800-160 MG PO TABS: 1.0000 | ORAL_TABLET | Freq: Two times a day (BID) | ORAL | Status: DC

## 2015-09-25 MED ORDER — GLIPIZIDE 10 MG PO TABS: 10.0000 mg | ORAL_TABLET | Freq: Two times a day (BID) | ORAL | Status: DC

## 2015-09-25 NOTE — Patient Instructions (Signed)
Start Bactrim DS (sulfamethoxazole/trimethoprim) tablets for urinary tract infection. Take 1 tablet by mouth twice daily for 7 days.  Complete lab work prior to leaving today. I will notify you of your results once received.   Please notify me if you develop fevers, increased pain, increased redness to your wound.  Do not get into the lake or pool as that could introduce bacteria into your wound.  It was a pleasure to meet you today! Please don't hesitate to call me with any questions. Welcome to [facility]!  Diabetes Mellitus and Food It is important for you to manage your blood sugar (glucose) level. Your blood glucose level can be greatly affected by what you eat. Eating healthier foods in the appropriate amounts throughout the day at about the same time each day will help you control your blood glucose level. It can also help slow or prevent worsening of your diabetes mellitus. Healthy eating may even help you improve the level of your blood pressure and reach or maintain a healthy weight.  General recommendations for healthful eating and cooking habits include:  Eating meals and snacks regularly. Avoid going long periods of time without eating to lose weight.  Eating a diet that consists mainly of plant-based foods, such as fruits, vegetables, nuts, legumes, and whole grains.  Using low-heat cooking methods, such as baking, instead of high-heat cooking methods, such as deep frying. Work with your dietitian to make sure you understand how to use the Nutrition Facts information on food labels. HOW CAN FOOD AFFECT ME? Carbohydrates Carbohydrates affect your blood glucose level more than any other type of food. Your dietitian will help you determine how many carbohydrates to eat at each meal and teach you how to count carbohydrates. Counting carbohydrates is important to keep your blood glucose at a healthy level, especially if you are using insulin or taking certain medicines for diabetes  mellitus. Alcohol Alcohol can cause sudden decreases in blood glucose (hypoglycemia), especially if you use insulin or take certain medicines for diabetes mellitus. Hypoglycemia can be a life-threatening condition. Symptoms of hypoglycemia (sleepiness, dizziness, and disorientation) are similar to symptoms of having too much alcohol.  If your health care provider has given you approval to drink alcohol, do so in moderation and use the following guidelines:  Women should not have more than one drink per day, and men should not have more than two drinks per day. One drink is equal to:  12 oz of beer.  5 oz of wine.  1 oz of hard liquor.  Do not drink on an empty stomach.  Keep yourself hydrated. Have water, diet soda, or unsweetened iced tea.  Regular soda, juice, and other mixers might contain a lot of carbohydrates and should be counted. WHAT FOODS ARE NOT RECOMMENDED? As you make food choices, it is important to remember that all foods are not the same. Some foods have fewer nutrients per serving than other foods, even though they might have the same number of calories or carbohydrates. It is difficult to get your body what it needs when you eat foods with fewer nutrients. Examples of foods that you should avoid that are high in calories and carbohydrates but low in nutrients include:  Trans fats (most processed foods list trans fats on the Nutrition Facts label).  Regular soda.  Juice.  Candy.  Sweets, such as cake, pie, doughnuts, and cookies.  Fried foods. WHAT FOODS CAN I EAT? Eat nutrient-rich foods, which will nourish your body and keep you  healthy. The food you should eat also will depend on several factors, including:  The calories you need.  The medicines you take.  Your weight.  Your blood glucose level.  Your blood pressure level.  Your cholesterol level. You should eat a variety of foods, including:  Protein.  Lean cuts of meat.  Proteins low in  saturated fats, such as fish, egg whites, and beans. Avoid processed meats.  Fruits and vegetables.  Fruits and vegetables that may help control blood glucose levels, such as apples, mangoes, and yams.  Dairy products.  Choose fat-free or low-fat dairy products, such as milk, yogurt, and cheese.  Grains, bread, pasta, and rice.  Choose whole grain products, such as multigrain bread, whole oats, and brown rice. These foods may help control blood pressure.  Fats.  Foods containing healthful fats, such as nuts, avocado, olive oil, canola oil, and fish. DOES EVERYONE WITH DIABETES MELLITUS HAVE THE SAME MEAL PLAN? Because every person with diabetes mellitus is different, there is not one meal plan that works for everyone. It is very important that you meet with a dietitian who will help you create a meal plan that is just right for you.   This information is not intended to replace advice given to you by your health care provider. Make sure you discuss any questions you have with your health care provider.   Document Released: 01/14/2005 Document Revised: 05/10/2014 Document Reviewed: 03/16/2013 Elsevier Interactive Patient Education [employer].

## 2015-09-25 NOTE — Progress Notes (Signed)
Subjective:    Patient ID: Melanie Navarro, female    DOB: 03/01/1961, 55 y.o.   MRN: [id number]  HPI  Melanie Navarro is a 55 year old female who presents today to transfer care from [facility] and also for a chief complaint of abscess.  1) Type 2 Diabetes: Diagnosed several years ago. Currently managed on Metformin 1000 mg BID. Her last A1C was 8.5 in February 2017. She was once managed on Invokana but her new insurance no longer covers so she's not had this medication since the beginning of the new year. She is due for a repeat A1C today. She is managed on an ARB.  2) Essential Hypertension: Diagnosed years ago. Currently managed on losartan 50 mg and Toprol XL 25 mg. She does not check her blood pressure at home. Denies chest pain, dizziness, shortness of breath. Blood pressure is above goal in the clinic today, however, she does have a rather painful abscess.  3) Abscess: Located to her right inner buttocks that had been present for the past 2-3 months. Over the past 2-3 days she's noticed an increase in size and pain. Denies fevers. She's taken ibuprofen and applied Saav for her discomfort without improvement. She sits during the day at work but cannot sit for a prolonged amount of time as this causes her pain.  Review of Systems  Constitutional: Negative for fever and chills.  Respiratory: Negative for shortness of breath.   Cardiovascular: Negative for chest pain.  Skin: Positive for wound.  Neurological: Negative for numbness.       Past Medical History  Diagnosis Date  . Diabetes mellitus   . Hypertension      Social History   Social History  . Marital Status: Married    Spouse Name: N/A  . Number of Children: N/A  . Years of Education: N/A   Occupational History  . Not on file.   Social History Main Topics  . Smoking status: Never Smoker   . Smokeless tobacco: Not on file  . Alcohol Use: 0.6 oz/week    1 Standard drinks or equivalent per week     Comment:  social  . Drug Use: No  . Sexual Activity: Not on file   Other Topics Concern  . Not on file   Social History Narrative   Married.   2 children. 1 grandchild.   Works in [employer].   Enjoys gardening, flowers, spending time with family.    No past surgical history on file.  Family History  Problem Relation Age of Onset  . Stroke Mother   . Hypertension Mother   . Heart disease Mother   . Diabetes Mother   . Diabetes Father   . Cancer Father     Lung  . Diabetes Brother     Allergies  Allergen Reactions  . Penicillins     Rash   . Sulfur     Rash     Current Outpatient Prescriptions on File Prior to Visit  Medication Sig Dispense Refill  . aspirin 81 MG tablet Take 81 mg by mouth daily.    . cyclobenzaprine (FLEXERIL) 5 MG tablet Take 1 tablet (5 mg total) by mouth at bedtime. 30 tablet 0  . glucose blood test strip Use as instructed 30 each 1  . Lancets (ONETOUCH ULTRASOFT) lancets Use as instructed 30 each 1  . losartan (COZAAR) 50 MG tablet Take 1 tablet (50 mg total) by mouth daily. 30 tablet 5  . metFORMIN (  GLUCOPHAGE) 1000 MG tablet TAKE 1 TABLET (1,000 MG TOTAL) BY MOUTH 2 (TWO) TIMES DAILY. 90 tablet 1  . metoprolol succinate (TOPROL-XL) 25 MG 24 hr tablet TAKE 1 TABLET (25 MG TOTAL) BY MOUTH DAILY. 30 tablet 11  . benzonatate (TESSALON) 200 MG capsule Take 200 mg by mouth 2 (two) times daily. Reported on 09/25/2015  0  . INVOKANA 100 MG TABS tablet TAKE 1 TABLET (100 MG TOTAL) BY MOUTH DAILY. (Patient not taking: Reported on 09/25/2015) 30 tablet 11   No current facility-administered medications on file prior to visit.    Pulse 86  Temp(Src) 98 F (36.7 C)  Ht 5\' 6"  (1.676 m)  Wt 170 lb (77.111 kg)  BMI 27.45 kg/m2  SpO2 98%    Objective:   Physical Exam  Constitutional: She appears well-nourished.  Cardiovascular: Normal rate and regular rhythm.   Pulmonary/Chest: Effort normal and breath sounds normal.  Skin: Skin is warm and dry.  3 cm  superficial abscess with 1 cm tip to right inner buttocks. Moderate erythema. Moderate reduction in size post I&D today. See procedure note.          Assessment & Plan:  Abscess:  Located to right inner buttocks for months, worse over past 2-3 days. Superficial, but also felt more deeply. Fluctuant.  Consent for procedure was signed and witnessed. Site cleaned with betadine soap. Local anesthetic injected to site (lidocaine 1%). Small incision made to center of abscess. Moderate amount of whitish puss expelled. Wound cleansed and dressing applied. Assisted by Anselm Pancoast, CMA.  Rx for Bactrim DS tablets provided today. [place]. Cleansing instructions provided. Return precautions provided.

## 2015-09-25 NOTE — Assessment & Plan Note (Signed)
Above goal today, however, is also here for a painful abscess to inner buttocks. Will re-access at upcoming physical. Continue Losartan and Toprol XL.

## 2015-09-25 NOTE — Progress Notes (Signed)
Pre visit review using our clinic review tool, if applicable. No additional management support is needed unless otherwise documented below in the visit note. 

## 2015-09-25 NOTE — Assessment & Plan Note (Signed)
A1C above goal in February 2017. Has not had invokana in months due to lack of insurance coverage. Managed on Metformin 1000 mg BID. A1C due today. Managed on ARM, urine microalbumin on file and negative. Pneumonia vaccination UTD.  If A1C above goal, will add Glipizide XL.

## 2015-09-27 LAB — CULTURE, ROUTINE-ABSCESS: GRAM STAIN: NONE SEEN

## 2015-10-06 ENCOUNTER — Other Ambulatory Visit: Payer: Self-pay

## 2015-10-06 MED ORDER — METFORMIN HCL 1000 MG PO TABS: ORAL_TABLET | ORAL | Status: DC

## 2015-10-09 ENCOUNTER — Telehealth: Payer: Self-pay | Admitting: Primary Care

## 2015-10-09 ENCOUNTER — Other Ambulatory Visit: Payer: Self-pay | Admitting: Primary Care

## 2015-10-09 DIAGNOSIS — E1165 Type 2 diabetes mellitus with hyperglycemia: Secondary | ICD-10-CM

## 2015-10-09 MED ORDER — EMPAGLIFLOZIN 10 MG PO TABS: 10.0000 mg | ORAL_TABLET | Freq: Every day | ORAL | Status: DC

## 2015-10-09 NOTE — Telephone Encounter (Signed)
Pt called back; pt does not know what blood sugar is.pt has not checked BS recently. Pt has been taking glipizide; glipizide causes dizziness.pt spoke with ins co and jardiance can be substituted for Invokana.pt wants to know if will send Jardiance to [facility]. Pt request cb.

## 2015-10-09 NOTE — Telephone Encounter (Signed)
Patient Name: Melanie Navarro DOB: Apr 12, 1961 Initial Comment Caller states c/o elevated blood sugar. She needs to know if she can take Jardiance. Nurse Assessment Guidelines Guideline Title Affirmed Question Affirmed Notes Final Disposition User FINAL ATTEMPT MADE - no message left Ronnald Ramp, Therapist, sports, [employer]

## 2015-10-09 NOTE — Telephone Encounter (Signed)
Spoken and notified patient of Kate's comments. Patient verbalized understanding. 

## 2015-10-09 NOTE — Telephone Encounter (Signed)
Noted. Will have her stop glipizide and add Jardiance as insurance will cover. Please notify patient that I sent this prescription to her pharmacy. Please also notify patient that I sent a 30 day supply for her to try. Please have her notify us if this works well for her as we will send additional refills.

## 2015-10-09 NOTE — Telephone Encounter (Signed)
Unable to reach pt by phone for more info. 

## 2015-12-09 ENCOUNTER — Telehealth: Payer: Self-pay | Admitting: Primary Care

## 2015-12-09 DIAGNOSIS — E1165 Type 2 diabetes mellitus with hyperglycemia: Secondary | ICD-10-CM

## 2015-12-09 NOTE — Telephone Encounter (Signed)
Ok to refill? Electronically refill request for empagliflozin (JARDIANCE) 10 MG TABS tablet  Last prescribed on 10/09/2015. Last seen on 09/25/2015.  FYI..On last telephone encounter patient was supposed to call to let us know if this medication works for her.

## 2015-12-10 NOTE — Telephone Encounter (Signed)
Patient returned Chan's call.  Please call patient back on her cell phone.

## 2015-12-10 NOTE — Telephone Encounter (Signed)
Follow up schedule appt on 12/31/2015.

## 2015-12-10 NOTE — Telephone Encounter (Signed)
Message left for patient to return my call.  

## 2015-12-31 ENCOUNTER — Encounter: Payer: Self-pay | Admitting: Internal Medicine

## 2015-12-31 ENCOUNTER — Ambulatory Visit (INDEPENDENT_AMBULATORY_CARE_PROVIDER_SITE_OTHER): Payer: BLUE CROSS/BLUE SHIELD | Admitting: Primary Care

## 2015-12-31 VITALS — BP 144/82 | HR 71 | Temp 97.9°F | Ht 66.0 in | Wt 169.4 lb

## 2015-12-31 DIAGNOSIS — I10 Essential (primary) hypertension: Secondary | ICD-10-CM

## 2015-12-31 DIAGNOSIS — Z1211 Encounter for screening for malignant neoplasm of colon: Secondary | ICD-10-CM

## 2015-12-31 DIAGNOSIS — G4700 Insomnia, unspecified: Secondary | ICD-10-CM | POA: Diagnosis not present

## 2015-12-31 DIAGNOSIS — E119 Type 2 diabetes mellitus without complications: Secondary | ICD-10-CM

## 2015-12-31 LAB — MICROALBUMIN / CREATININE URINE RATIO
CREATININE, U: 40.9 mg/dL
Microalb Creat Ratio: 1.7 mg/g (ref 0.0–30.0)
Microalb, Ur: <0.7 mg/dL (ref 0.0–1.9)

## 2015-12-31 LAB — LIPID PANEL
CHOLESTEROL: 233 mg/dL — AB (ref 0–200)
HDL: 64.6 mg/dL (ref >39.00)
LDL CALC: 133 mg/dL — AB (ref 0–99)
NonHDL: 168.32
TRIGLYCERIDES: 175 mg/dL — AB (ref 0.0–149.0)
Total CHOL/HDL Ratio: 4
VLDL: 35 mg/dL (ref 0.0–40.0)

## 2015-12-31 LAB — COMPREHENSIVE METABOLIC PANEL
ALT: 22 U/L (ref 0–35)
AST: 20 U/L (ref 0–37)
Albumin: 4.4 g/dL (ref 3.5–5.2)
Alkaline Phosphatase: 64 U/L (ref 39–117)
BILIRUBIN TOTAL: 0.5 mg/dL (ref 0.2–1.2)
BUN: 14 mg/dL (ref 6–23)
CALCIUM: 9.3 mg/dL (ref 8.4–10.5)
CHLORIDE: 102 meq/L (ref 96–112)
CO2: 30 meq/L (ref 19–32)
Creatinine, Ser: 0.66 mg/dL (ref 0.40–1.20)
GFR: 98.37 mL/min (ref >60.00)
Glucose, Bld: 195 mg/dL — ABNORMAL HIGH (ref 70–99)
Potassium: 4.1 mEq/L (ref 3.5–5.1)
Sodium: 139 mEq/L (ref 135–145)
Total Protein: 7.3 g/dL (ref 6.0–8.3)

## 2015-12-31 LAB — HEMOGLOBIN A1C: Hgb A1c MFr Bld: 8.6 % — ABNORMAL HIGH (ref 4.6–6.5)

## 2015-12-31 NOTE — Assessment & Plan Note (Signed)
Due for repeat A1c. Previous A1c is 10. Could not tolerate glipizide therefore Jardiance was supplemented. Continue metformin. Foot exam completed today. Pneumonia vaccination up-to-date. Managed on ARB.

## 2015-12-31 NOTE — Patient Instructions (Signed)
Complete lab work prior to leaving today. I will notify you of your results once received.   Try Melatonin tablets for difficulty sleeping. Take 3-5 mg by mouth 1 hour prior to bedtime.  You will be contacted regarding your referral to GI regarding your colonoscopy.  Please let us know if you have not heard back within one week.   We will be in contact soon regarding follow up.  It was a pleasure to see you today!  Diabetes Mellitus and Food It is important for you to manage your blood sugar (glucose) level. Your blood glucose level can be greatly affected by what you eat. Eating healthier foods in the appropriate amounts throughout the day at about the same time each day will help you control your blood glucose level. It can also help slow or prevent worsening of your diabetes mellitus. Healthy eating may even help you improve the level of your blood pressure and reach or maintain a healthy weight.  General recommendations for healthful eating and cooking habits include:  Eating meals and snacks regularly. Avoid going long periods of time without eating to lose weight.  Eating a diet that consists mainly of plant-based foods, such as fruits, vegetables, nuts, legumes, and whole grains.  Using low-heat cooking methods, such as baking, instead of high-heat cooking methods, such as deep frying. Work with your dietitian to make sure you understand how to use the Nutrition Facts information on food labels. HOW CAN FOOD AFFECT ME? Carbohydrates Carbohydrates affect your blood glucose level more than any other type of food. Your dietitian will help you determine how many carbohydrates to eat at each meal and teach you how to count carbohydrates. Counting carbohydrates is important to keep your blood glucose at a healthy level, especially if you are using insulin or taking certain medicines for diabetes mellitus. Alcohol Alcohol can cause sudden decreases in blood glucose (hypoglycemia), especially  if you use insulin or take certain medicines for diabetes mellitus. Hypoglycemia can be a life-threatening condition. Symptoms of hypoglycemia (sleepiness, dizziness, and disorientation) are similar to symptoms of having too much alcohol.  If your health care provider has given you approval to drink alcohol, do so in moderation and use the following guidelines:  Women should not have more than one drink per day, and men should not have more than two drinks per day. One drink is equal to:  12 oz of beer.  5 oz of wine.  1 oz of hard liquor.  Do not drink on an empty stomach.  Keep yourself hydrated. Have water, diet soda, or unsweetened iced tea.  Regular soda, juice, and other mixers might contain a lot of carbohydrates and should be counted. WHAT FOODS ARE NOT RECOMMENDED? As you make food choices, it is important to remember that all foods are not the same. Some foods have fewer nutrients per serving than other foods, even though they might have the same number of calories or carbohydrates. It is difficult to get your body what it needs when you eat foods with fewer nutrients. Examples of foods that you should avoid that are high in calories and carbohydrates but low in nutrients include:  Trans fats (most processed foods list trans fats on the Nutrition Facts label).  Regular soda.  Juice.  Candy.  Sweets, such as cake, pie, doughnuts, and cookies.  Fried foods. WHAT FOODS CAN I EAT? Eat nutrient-rich foods, which will nourish your body and keep you healthy. The food you should eat also will depend  on several factors, including:  The calories you need.  The medicines you take.  Your weight.  Your blood glucose level.  Your blood pressure level.  Your cholesterol level. You should eat a variety of foods, including:  Protein.  Lean cuts of meat.  Proteins low in saturated fats, such as fish, egg whites, and beans. Avoid processed meats.  Fruits and  vegetables.  Fruits and vegetables that may help control blood glucose levels, such as apples, mangoes, and yams.  Dairy products.  Choose fat-free or low-fat dairy products, such as milk, yogurt, and cheese.  Grains, bread, pasta, and rice.  Choose whole grain products, such as multigrain bread, whole oats, and brown rice. These foods may help control blood pressure.  Fats.  Foods containing healthful fats, such as nuts, avocado, olive oil, canola oil, and fish. DOES EVERYONE WITH DIABETES MELLITUS HAVE THE SAME MEAL PLAN? Because every person with diabetes mellitus is different, there is not one meal plan that works for everyone. It is very important that you meet with a dietitian who will help you create a meal plan that is just right for you.   This information is not intended to replace advice given to you by your health care provider. Make sure you discuss any questions you have with your health care provider.   Document Released: 01/14/2005 Document Revised: 05/10/2014 Document Reviewed: 03/16/2013 Elsevier Interactive Patient Education [employer].

## 2015-12-31 NOTE — Assessment & Plan Note (Signed)
Difficulty falling asleep intermittently over the past 1 year. Discussed proper bedtime routine including refraining from watching TV and no caffeine consumption after 2 PM. Discussed over-the-counter melatonin as needed. Does not seem to have daily symptoms of anxiety. We'll continue to monitor.

## 2015-12-31 NOTE — Assessment & Plan Note (Signed)
Slightly above goal in office today. We'll have her monitor blood pressure and report readings at or above 140/90. Continue losartan 50 mg and Toprol XL 25 mg.

## 2015-12-31 NOTE — Progress Notes (Signed)
Pre visit review using our clinic review tool, if applicable. No additional management support is needed unless otherwise documented below in the visit note. 

## 2015-12-31 NOTE — Progress Notes (Signed)
Subjective:    Patient ID: PETTY GRAYDON, female    DOB: March 16, 1961, 55 y.o.   MRN: [id number]  HPI  Ms. Jablonowski is a 55 year old female who presents today for follow up, and multiple complaints.  1) Type 2 Diabetes: A1C of 10.0 in May 2017. SHe is currently managed on Glipizide 10 mg, Jardiance 10 mg, and Metformin 1000 mg BID. She stopped taking Glipizide shortly after prescribed given dizziness. She is checking her blood sugars once to twice weekly fasting and are running close to 200. She is walking twice weekly. Denies numbness/tingling to her hands/feet, dizziness, palpitations.   2) Essential Hypertension: Above goal in the clinic today at 144/82. She is currently managed on Losartan 50 mg and Toprol XL 25 mg. She does not check her BP at home. Denies chest pain, lower extremity edema, shortness of breath.   BP Readings from Last 3 Encounters:  12/31/15 (!) 144/82  06/09/15 138/74  05/07/15 132/80   3) Abdominal Discomfort: Generalized discomfort and diarrhea after eating heavy meals. She's never completed a colonoscopy. Denies rectal bleeding, unexplained weight loss, constipation.   4) Insomnia: Difficulty falling asleep intermittently for the past 1 year. She does not typically wake during the night, but her mind will race visiting her from falling asleep. She's worked on curring back on caffeine consumption. She's not tried anything over-the-counter for her symptoms. Denies daily anxiety, worry, irritability.  Review of Systems  Respiratory: Negative for shortness of breath.   Cardiovascular: Negative for chest pain.  Gastrointestinal:       Abdominal discomfort after large meals.  Neurological: Negative for numbness.  Psychiatric/Behavioral: Positive for sleep disturbance. The patient is not nervous/anxious.        Past Medical History:  Diagnosis Date  . Diabetes mellitus   . Hypertension      Social History   Social History  . Marital status: Married    Spouse  name: N/A  . Number of children: N/A  . Years of education: N/A   Occupational History  . Not on file.   Social History Main Topics  . Smoking status: Never Smoker  . Smokeless tobacco: Not on file  . Alcohol use 0.6 oz/week    1 Standard drinks or equivalent per week     Comment: social  . Drug use: No  . Sexual activity: Not on file   Other Topics Concern  . Not on file   Social History Narrative   Married.   2 children. 1 grandchild.   Works in Science [employer].   Enjoys gardening, flowers, spending time with family.    No past surgical history on file.  Family History  Problem Relation Age of Onset  . Stroke Mother   . Hypertension Mother   . Heart disease Mother   . Diabetes Mother   . Diabetes Father   . Cancer Father     Lung  . Diabetes Brother     Allergies  Allergen Reactions  . Glipizide     dizziness  . Penicillins     Rash   . Sulfur     Rash     Current Outpatient Prescriptions on File Prior to Visit  Medication Sig Dispense Refill  . aspirin 81 MG tablet Take 81 mg by mouth daily.    Marland Kitchen glucose blood test strip Use as instructed 30 each 1  . JARDIANCE 10 MG TABS tablet TAKE 1 TABLET BY MOUTH EVERY DAY 30 tablet 0  .  Lancets (ONETOUCH ULTRASOFT) lancets Use as instructed 30 each 1  . losartan (COZAAR) 50 MG tablet Take 1 tablet (50 mg total) by mouth daily. 30 tablet 5  . metFORMIN (GLUCOPHAGE) 1000 MG tablet TAKE 1 TABLET (1,000 MG TOTAL) BY MOUTH 2 (TWO) TIMES DAILY. 90 tablet 1  . metoprolol succinate (TOPROL-XL) 25 MG 24 hr tablet TAKE 1 TABLET (25 MG TOTAL) BY MOUTH DAILY. 30 tablet 11  . cyclobenzaprine (FLEXERIL) 5 MG tablet Take 1 tablet (5 mg total) by mouth at bedtime. (Patient not taking: Reported on 12/31/2015) 30 tablet 0   No current facility-administered medications on file prior to visit.     BP (!) 144/82   Pulse 71   Temp 97.9 F (36.6 C) (Oral)   Ht 5\' 6"  (1.676 m)   Wt 169 lb 6.4 oz (76.8 kg)   SpO2 98%   BMI 27.34 kg/m      Objective:   Physical Exam  Constitutional: She appears well-nourished.  Neck: Neck supple.  Cardiovascular: Normal rate and regular rhythm.   Pulmonary/Chest: Effort normal and breath sounds normal.  Skin: Skin is warm and dry.  Psychiatric: She has a normal mood and affect.          Assessment & Plan:  Abdominal discomfort:  Only after large meals, located generalized abdomen. Could be related to slow gastric emptying. No alarm signs. Patient due for screening colonoscopy, ordered today.  Sheral Flow, NP

## 2016-01-01 ENCOUNTER — Other Ambulatory Visit: Payer: Self-pay | Admitting: Primary Care

## 2016-01-02 ENCOUNTER — Telehealth: Payer: Self-pay | Admitting: Primary Care

## 2016-01-02 NOTE — Telephone Encounter (Signed)
Pt returned your call.  

## 2016-01-06 NOTE — Telephone Encounter (Signed)
Patient is returning call from lab results.  Left message on patient's voicemail to return call.

## 2016-01-09 ENCOUNTER — Other Ambulatory Visit: Payer: Self-pay | Admitting: Primary Care

## 2016-01-12 ENCOUNTER — Other Ambulatory Visit: Payer: Self-pay | Admitting: Primary Care

## 2016-01-12 DIAGNOSIS — E119 Type 2 diabetes mellitus without complications: Secondary | ICD-10-CM

## 2016-01-12 MED ORDER — EMPAGLIFLOZIN 25 MG PO TABS: ORAL_TABLET | ORAL | 1 refills | Status: DC

## 2016-02-18 ENCOUNTER — Encounter (INDEPENDENT_AMBULATORY_CARE_PROVIDER_SITE_OTHER): Payer: Self-pay

## 2016-02-18 ENCOUNTER — Ambulatory Visit (AMBULATORY_SURGERY_CENTER): Payer: Self-pay | Admitting: *Deleted

## 2016-02-18 VITALS — Ht 65.5 in | Wt 170.2 lb

## 2016-02-18 DIAGNOSIS — Z1211 Encounter for screening for malignant neoplasm of colon: Secondary | ICD-10-CM

## 2016-02-18 MED ORDER — NA SULFATE-K SULFATE-MG SULF 17.5-3.13-1.6 GM/177ML PO SOLN
1.0000 | Freq: Once | ORAL | 0 refills | Status: AC
Start: 2016-02-18 — End: 2016-02-18

## 2016-02-18 NOTE — Progress Notes (Signed)
No allergies to eggs or soy. No problems with anesthesia.  Pt given Emmi instructions for colonoscopy  No oxygen use  No diet drug use  

## 2016-02-19 ENCOUNTER — Encounter: Payer: Self-pay | Admitting: Internal Medicine

## 2016-03-03 ENCOUNTER — Encounter: Payer: Self-pay | Admitting: Internal Medicine

## 2016-03-03 ENCOUNTER — Ambulatory Visit (AMBULATORY_SURGERY_CENTER): Payer: BLUE CROSS/BLUE SHIELD | Admitting: Internal Medicine

## 2016-03-03 VITALS — BP 108/54 | HR 67 | Temp 97.3°F | Resp 11 | Ht 65.0 in | Wt 170.0 lb

## 2016-03-03 DIAGNOSIS — Z1211 Encounter for screening for malignant neoplasm of colon: Secondary | ICD-10-CM | POA: Diagnosis not present

## 2016-03-03 DIAGNOSIS — D120 Benign neoplasm of cecum: Secondary | ICD-10-CM | POA: Diagnosis not present

## 2016-03-03 DIAGNOSIS — Z1212 Encounter for screening for malignant neoplasm of rectum: Secondary | ICD-10-CM

## 2016-03-03 DIAGNOSIS — D123 Benign neoplasm of transverse colon: Secondary | ICD-10-CM | POA: Diagnosis not present

## 2016-03-03 MED ORDER — SODIUM CHLORIDE 0.9 % IV SOLN: 500.0000 mL | INTRAVENOUS | Status: DC

## 2016-03-03 NOTE — Patient Instructions (Signed)
YOU HAD AN ENDOSCOPIC PROCEDURE TODAY AT [facility]:   Refer to the procedure report that was given to you for any specific questions about what was found during the examination.  If the procedure report does not answer your questions, please call your gastroenterologist to clarify.  If you requested that your care partner not be given the details of your procedure findings, then the procedure report has been included in a sealed envelope for you to review at your convenience later.  YOU SHOULD EXPECT: Some feelings of bloating in the abdomen. Passage of more gas than usual.  Walking can help get rid of the air that was put into your GI tract during the procedure and reduce the bloating. If you had a lower endoscopy (such as a colonoscopy or flexible sigmoidoscopy) you may notice spotting of blood in your stool or on the toilet paper. If you underwent a bowel prep for your procedure, you may not have a normal bowel movement for a few days.  Please Note:  You might notice some irritation and congestion in your nose or some drainage.  This is from the oxygen used during your procedure.  There is no need for concern and it should clear up in a day or so.  SYMPTOMS TO REPORT IMMEDIATELY:   Following lower endoscopy (colonoscopy or flexible sigmoidoscopy):  Excessive amounts of blood in the stool  Significant tenderness or worsening of abdominal pains  Swelling of the abdomen that is new, acute  Fever of 100F or higher  For urgent or emergent issues, a gastroenterologist can be reached at any hour by calling [phone].  DIET:  We do recommend a small meal at first, but then you may proceed to your regular diet.  Drink plenty of fluids but you should avoid alcoholic beverages for 24 hours.  ACTIVITY:  You should plan to take it easy for the rest of today and you should NOT DRIVE or use heavy machinery until tomorrow (because of the sedation medicines used during the test).     FOLLOW UP: Our staff will call the number listed on your records the next business day following your procedure to check on you and address any questions or concerns that you may have regarding the information given to you following your procedure. If we do not reach you, we will leave a message.  However, if you are feeling well and you are not experiencing any problems, there is no need to return our call.  We will assume that you have returned to your regular daily activities without incident.  If any biopsies were taken you will be contacted by phone or by letter within the next 1-3 weeks.  Please call us at [phone] if you have not heard about the biopsies in 3 weeks.    SIGNATURES/CONFIDENTIALITY: You and/or your care partner have signed paperwork which will be entered into your electronic medical record.  These signatures attest to the fact that that the information above on your After Visit Summary has been reviewed and is understood.  Full responsibility of the confidentiality of this discharge information lies with you and/or your care-partner.  Please read over handout about polyps  Please continue your normal medications

## 2016-03-03 NOTE — Op Note (Signed)
[facility] Patient Name: Melanie Navarro Procedure Date: 03/03/2016 9:39 AM MRN: [id number] Endoscopist: Jerene Bears , MD Age: 55 Referring MD:  Date of Birth: 1960/12/06 Gender: Female Account #: [id number] Procedure:                Colonoscopy Indications:              Screening for colorectal malignant neoplasm, This                            is the patient's first colonoscopy Medicines:                Monitored Anesthesia Care Procedure:                Pre-Anesthesia Assessment:                           - Prior to the procedure, a History and Physical                            was performed, and patient medications and                            allergies were reviewed. The patient's tolerance of                            previous anesthesia was also reviewed. The risks                            and benefits of the procedure and the sedation                            options and risks were discussed with the patient.                            All questions were answered, and informed consent                            was obtained. Prior Anticoagulants: The patient has                            taken no previous anticoagulant or antiplatelet                            agents. ASA Grade Assessment: II - A patient with                            mild systemic disease. After reviewing the risks                            and benefits, the patient was deemed in                            satisfactory condition to undergo the procedure.  After obtaining informed consent, the colonoscope                            was passed under direct vision. Throughout the                            procedure, the patient's blood pressure, pulse, and                            oxygen saturations were monitored continuously. The                            Model PCF-[id number]) scope was introduced                            through the anus and  advanced to the the cecum,                            identified by appendiceal orifice and ileocecal                            valve. The colonoscopy was performed without                            difficulty. The patient tolerated the procedure                            well. The quality of the bowel preparation was                            good. The ileocecal valve, appendiceal orifice, and                            rectum were photographed. Scope In: 9:52:34 AM Scope Out: 10:12:22 AM Scope Withdrawal Time: 0 hours 13 minutes 38 seconds  Total Procedure Duration: 0 hours 19 minutes 48 seconds  Findings:                 The perianal and digital rectal examinations were                            normal.                           A 2 mm polyp was found in the cecum. The polyp was                            sessile. The polyp was removed with a cold biopsy                            forceps. Resection and retrieval were complete.                           Two sessile polyps were found in the transverse  colon and hepatic flexure. The polyps were 4 to 5                            mm in size. These polyps were removed with a cold                            snare. Resection and retrieval were complete.                           The exam was otherwise without abnormality on                            direct and retroflexion views. Complications:            No immediate complications. Estimated Blood Loss:     Estimated blood loss was minimal. Impression:               - One 2 mm polyp in the cecum, removed with a cold                            biopsy forceps. Resected and retrieved.                           - Two 4 to 5 mm polyps in the transverse colon and                            at the hepatic flexure, removed with a cold snare.                            Resected and retrieved.                           - The examination was otherwise normal on direct                             and retroflexion views. Recommendation:           - Patient has a contact number available for                            emergencies. The signs and symptoms of potential                            delayed complications were discussed with the                            patient. Return to normal activities tomorrow.                            Written discharge instructions were provided to the                            patient.                           -  Resume previous diet.                           - Continue present medications.                           - Await pathology results.                           - Repeat colonoscopy is recommended. The                            colonoscopy date will be determined after pathology                            results from today's exam become available for                            review. Jerene Bears, MD 03/03/2016 10:15:12 AM This report has been signed electronically.

## 2016-03-03 NOTE — Progress Notes (Signed)
Called to room to assist during endoscopic procedure.  Patient ID and intended procedure confirmed with present staff. Received instructions for my participation in the procedure from the performing physician.  

## 2016-03-03 NOTE — Progress Notes (Signed)
To PACU Pt awake and alert. Report to RN 

## 2016-03-04 ENCOUNTER — Telehealth: Payer: Self-pay

## 2016-03-04 NOTE — Telephone Encounter (Signed)
Attempted to reach pt. Following procedure yesterday.   LM with our phone no., to call if they have any questions or concerns.   Will try to reach pt. Again later today.

## 2016-03-08 ENCOUNTER — Encounter: Payer: Self-pay | Admitting: Internal Medicine

## 2016-03-27 ENCOUNTER — Other Ambulatory Visit: Payer: Self-pay | Admitting: Internal Medicine

## 2016-04-13 ENCOUNTER — Other Ambulatory Visit: Payer: Self-pay | Admitting: Internal Medicine

## 2016-04-13 DIAGNOSIS — I10 Essential (primary) hypertension: Secondary | ICD-10-CM

## 2016-04-17 ENCOUNTER — Other Ambulatory Visit: Payer: Self-pay | Admitting: Primary Care

## 2016-06-29 ENCOUNTER — Encounter: Payer: Self-pay | Admitting: Primary Care

## 2016-06-30 ENCOUNTER — Other Ambulatory Visit: Payer: Self-pay | Admitting: Nurse Practitioner

## 2016-06-30 DIAGNOSIS — I10 Essential (primary) hypertension: Secondary | ICD-10-CM

## 2016-07-31 ENCOUNTER — Other Ambulatory Visit: Payer: Self-pay | Admitting: Primary Care

## 2016-07-31 DIAGNOSIS — E119 Type 2 diabetes mellitus without complications: Secondary | ICD-10-CM

## 2016-08-02 NOTE — Telephone Encounter (Signed)
She is overdue for follow up. Please schedule an office follow up visit. Have her come fasting for labs.

## 2016-08-02 NOTE — Telephone Encounter (Signed)
Ok to refill? Electronically refill request for empagliflozin (JARDIANCE) 25 MG TABS tablet. Last prescribed on 01/12/2016. Last seen on 12/31/2015

## 2016-08-04 NOTE — Telephone Encounter (Signed)
Lm on pts vm requesting a call back to schedule f/u. visit

## 2016-08-09 ENCOUNTER — Encounter: Payer: Self-pay | Admitting: *Deleted

## 2016-08-09 NOTE — Telephone Encounter (Signed)
Lm on pts vm requesting a call back to schedule appt. Letter mailed.

## 2016-09-30 ENCOUNTER — Other Ambulatory Visit: Payer: Self-pay | Admitting: Primary Care

## 2016-09-30 DIAGNOSIS — E119 Type 2 diabetes mellitus without complications: Secondary | ICD-10-CM

## 2016-11-19 ENCOUNTER — Other Ambulatory Visit: Payer: Self-pay

## 2016-11-19 DIAGNOSIS — E119 Type 2 diabetes mellitus without complications: Secondary | ICD-10-CM

## 2016-11-19 MED ORDER — EMPAGLIFLOZIN 25 MG PO TABS: 25.0000 mg | ORAL_TABLET | Freq: Every day | ORAL | 0 refills | Status: DC

## 2016-11-19 NOTE — Telephone Encounter (Signed)
Pt left v/m requesting jardiance to [facility] [address]. Pt has appt to see Allie Bossier NP on 11/26/16. Refilled per protocol. Left v/m for pt to ck with pharmacy.

## 2016-11-26 ENCOUNTER — Ambulatory Visit: Payer: BLUE CROSS/BLUE SHIELD | Admitting: Primary Care

## 2016-12-09 ENCOUNTER — Encounter: Payer: Self-pay | Admitting: Primary Care

## 2016-12-09 ENCOUNTER — Ambulatory Visit (INDEPENDENT_AMBULATORY_CARE_PROVIDER_SITE_OTHER): Payer: BLUE CROSS/BLUE SHIELD | Admitting: Primary Care

## 2016-12-09 VITALS — BP 140/78 | HR 74 | Temp 98.1°F | Ht 65.0 in | Wt 169.4 lb

## 2016-12-09 DIAGNOSIS — E119 Type 2 diabetes mellitus without complications: Secondary | ICD-10-CM

## 2016-12-09 DIAGNOSIS — E785 Hyperlipidemia, unspecified: Secondary | ICD-10-CM | POA: Diagnosis not present

## 2016-12-09 DIAGNOSIS — I10 Essential (primary) hypertension: Secondary | ICD-10-CM | POA: Diagnosis not present

## 2016-12-09 LAB — COMPREHENSIVE METABOLIC PANEL
ALBUMIN: 4.4 g/dL (ref 3.5–5.2)
ALT: 19 U/L (ref 0–35)
AST: 15 U/L (ref 0–37)
Alkaline Phosphatase: 87 U/L (ref 39–117)
BUN: 17 mg/dL (ref 6–23)
CALCIUM: 9.4 mg/dL (ref 8.4–10.5)
CHLORIDE: 102 meq/L (ref 96–112)
CO2: 26 mEq/L (ref 19–32)
Creatinine, Ser: 0.7 mg/dL (ref 0.40–1.20)
GFR: 91.61 mL/min (ref >60.00)
GLUCOSE: 248 mg/dL — AB (ref 70–99)
POTASSIUM: 4.1 meq/L (ref 3.5–5.1)
SODIUM: 138 meq/L (ref 135–145)
Total Bilirubin: 0.5 mg/dL (ref 0.2–1.2)
Total Protein: 7.2 g/dL (ref 6.0–8.3)

## 2016-12-09 LAB — LIPID PANEL
Cholesterol: 234 mg/dL — ABNORMAL HIGH (ref 0–200)
HDL: 55.7 mg/dL (ref >39.00)
LDL CALC: 154 mg/dL — AB (ref 0–99)
NONHDL: 178.58
Total CHOL/HDL Ratio: 4
Triglycerides: 121 mg/dL (ref 0.0–149.0)
VLDL: 24.2 mg/dL (ref 0.0–40.0)

## 2016-12-09 LAB — HEMOGLOBIN A1C: Hgb A1c MFr Bld: 12.8 % — ABNORMAL HIGH (ref 4.6–6.5)

## 2016-12-09 MED ORDER — METFORMIN HCL ER 750 MG PO TB24: ORAL_TABLET | ORAL | 1 refills | Status: DC

## 2016-12-09 NOTE — Progress Notes (Signed)
Subjective:    Patient ID: Melanie Navarro, female    DOB: 10-17-1960, 55 y.o.   MRN: [id number]  HPI  Melanie Navarro is a 56 year old female who presents today for follow up.  1) Type 2 Diabetes: Currently managed on Metformin 1000 mg BID and empagliflozine 25 mg once daily. Her last A1C was 8.6 in August 2017. She's not followed up in our clinic since. Metformin causes diarrhea and cramping everyday.   Diet currently consists of:  Breakfast: Boiled egg, fast food, toast, bacon/sausage Lunch: Tomato sandwiches, potato salad, left overs Dinner: Breakfast food sometimes.Grilled burgers, chicken. Chicken pie. Potatoes, bread. Snacks: Sweets Desserts: Candy three times weekly Beverages: Water, diet soda, decaf un-sweet tea  Exercise: Walking three days weekly for 20 minutes.   2) Essential Hypertension: Currently managed on losartan 50 mg and metoprolol succinate 25 mg. Her BP in the office today is 140/78. She's not checking her BP at home, but [facility]'s-[facility]'s.   3) Hyperlipidemia: Lipid panel in August 2017 above goal. TC of 233, LDL of 133. It was recommended she start atorvastatin given history of diabetes and hypertension, she kindly declined.   The 10-year ASCVD risk score Mikey Bussing DC Brooke Bonito., et al., 2013) is: 7.5%   Values used to calculate the score:     Age: 54 years     Sex: Female     Is Non-Hispanic African American: No     Diabetic: Yes     Tobacco smoker: No     Systolic Blood Pressure: 809 mmHg     Is BP treated: Yes     HDL Cholesterol: 64.6 mg/dL     Total Cholesterol: 233 mg/dL   Review of Systems  Constitutional: Negative for fatigue.  Eyes: Negative for visual disturbance.  Respiratory: Negative for shortness of breath.   Cardiovascular: Negative for chest pain.  Neurological: Negative for dizziness and numbness.       Past Medical History:  Diagnosis Date  . Diabetes mellitus   . Hyperlipidemia   . Hypertension      Social History    Social History  . Marital status: Married    Spouse name: N/A  . Number of children: N/A  . Years of education: N/A   Occupational History  . Not on file.   Social History Main Topics  . Smoking status: Never Smoker  . Smokeless tobacco: Never Used  . Alcohol use 0.6 oz/week    1 Standard drinks or equivalent per week  . Drug use: No  . Sexual activity: Not on file   Other Topics Concern  . Not on file   Social History Narrative   Married.   2 children. 1 grandchild.   Works in Science [employer].   Enjoys gardening, flowers, spending time with family.    Past Surgical History:  Procedure Laterality Date  . WISDOM TOOTH EXTRACTION  1984    Family History  Problem Relation Age of Onset  . Stroke Mother   . Hypertension Mother   . Heart disease Mother   . Diabetes Mother   . Diabetes Father   . Cancer Father        Lung  . Diabetes Brother   . Colon cancer Neg Hx     Allergies  Allergen Reactions  . Glipizide     dizziness  . Penicillins     Rash   . Sulfur     Rash   . Codeine Swelling and Rash  Current Outpatient Prescriptions on File Prior to Visit  Medication Sig Dispense Refill  . aspirin 81 MG tablet Take 81 mg by mouth daily.    . empagliflozin (JARDIANCE) 25 MG TABS tablet Take 25 mg by mouth daily. NEED OFFICE VISIT FOR FURTHER REFILLS 30 tablet 0  . glucose blood test strip Use as instructed 30 each 1  . Lancets (ONETOUCH ULTRASOFT) lancets Use as instructed 30 each 1  . losartan (COZAAR) 50 MG tablet TAKE 1 TABLET BY MOUTH EVERY DAY 90 tablet 2  . metFORMIN (GLUCOPHAGE) 1000 MG tablet TAKE 1 TABLET (1,000 MG TOTAL) BY MOUTH 2 (TWO) TIMES DAILY. 60 tablet 2  . metoprolol succinate (TOPROL-XL) 25 MG 24 hr tablet TAKE 1 TABLET (25 MG TOTAL) BY MOUTH DAILY. 90 tablet 2  . cyclobenzaprine (FLEXERIL) 5 MG tablet Take 1 tablet (5 mg total) by mouth at bedtime. (Patient not taking: Reported on 03/03/2016) 30 tablet 0   Current Facility-Administered  Medications on File Prior to Visit  Medication Dose Route Frequency Provider Last Rate Last Dose  . 0.9 %  sodium chloride infusion  500 mL Intravenous Continuous Pyrtle, Lajuan Lines, MD        BP 140/78   Pulse 74   Temp 98.1 F (36.7 C) (Oral)   Ht 5\' 5"  (1.651 m)   Wt 169 lb 6.4 oz (76.8 kg)   SpO2 99%   BMI 28.19 kg/m    Objective:   Physical Exam  Constitutional: She appears well-nourished.  Neck: Neck supple.  Cardiovascular: Normal rate and regular rhythm.   Pulmonary/Chest: Effort normal and breath sounds normal.  Skin: Skin is warm and dry.          Assessment & Plan:

## 2016-12-09 NOTE — Assessment & Plan Note (Signed)
Above goal today, will have her start monitoring home readings and report them in 2 weeks. Consider increasing losartan to 100 mg if needed. Long discussion regarding importance of weight loss through diet and exercise.

## 2016-12-09 NOTE — Assessment & Plan Note (Addendum)
Above goal 1 year ago, never came in for follow up as recommended. Check A1C today. Change Metformin to XR version given side effects of diarrhea. Continue Jardiance. Check BMP today. If A1C above goal, will add in another medication.

## 2016-12-09 NOTE — Patient Instructions (Addendum)
Complete lab work prior to leaving today. I will notify you of your results once received.   We've switched Metformin to the extended release form to limit side effects of diarrhea. Start metformin 750 mg. Take 2 tablets once daily with food.   Continue Jardiance for now.  Check your blood pressure daily, around the same time of day, for the next 2 weeks.  Ensure that you have rested for 30 minutes prior to checking your blood pressure. Record your readings and I'll call you for those readings.  It is important that you improve your diet. Please limit carbohydrates in the form of white bread, rice, pasta, sweets, fast food, fried food, sugary drinks, etc. Increase your consumption of fresh fruits and vegetables, whole grains, lean protein.  Ensure you are consuming 64 ounces of water daily.  Continue exercising. You should be getting 150 minutes of moderate intensity exercise weekly.  Follow up in 6 months for follow up of diabetes.   It was a pleasure to see you today!  Diabetes Mellitus and Food It is important for you to manage your blood sugar (glucose) level. Your blood glucose level can be greatly affected by what you eat. Eating healthier foods in the appropriate amounts throughout the day at about the same time each day will help you control your blood glucose level. It can also help slow or prevent worsening of your diabetes mellitus. Healthy eating may even help you improve the level of your blood pressure and reach or maintain a healthy weight. General recommendations for healthful eating and cooking habits include:  Eating meals and snacks regularly. Avoid going long periods of time without eating to lose weight.  Eating a diet that consists mainly of plant-based foods, such as fruits, vegetables, nuts, legumes, and whole grains.  Using low-heat cooking methods, such as baking, instead of high-heat cooking methods, such as deep frying.  Work with your dietitian to make sure  you understand how to use the Nutrition Facts information on food labels. How can food affect me? Carbohydrates Carbohydrates affect your blood glucose level more than any other type of food. Your dietitian will help you determine how many carbohydrates to eat at each meal and teach you how to count carbohydrates. Counting carbohydrates is important to keep your blood glucose at a healthy level, especially if you are using insulin or taking certain medicines for diabetes mellitus. Alcohol Alcohol can cause sudden decreases in blood glucose (hypoglycemia), especially if you use insulin or take certain medicines for diabetes mellitus. Hypoglycemia can be a life-threatening condition. Symptoms of hypoglycemia (sleepiness, dizziness, and disorientation) are similar to symptoms of having too much alcohol. If your health care provider has given you approval to drink alcohol, do so in moderation and use the following guidelines:  Women should not have more than one drink per day, and men should not have more than two drinks per day. One drink is equal to: ? 12 oz of beer. ? 5 oz of wine. ? 1 oz of hard liquor.  Do not drink on an empty stomach.  Keep yourself hydrated. Have water, diet soda, or unsweetened iced tea.  Regular soda, juice, and other mixers might contain a lot of carbohydrates and should be counted.  What foods are not recommended? As you make food choices, it is important to remember that all foods are not the same. Some foods have fewer nutrients per serving than other foods, even though they might have the same number of calories or  carbohydrates. It is difficult to get your body what it needs when you eat foods with fewer nutrients. Examples of foods that you should avoid that are high in calories and carbohydrates but low in nutrients include:  Trans fats (most processed foods list trans fats on the Nutrition Facts label).  Regular soda.  Juice.  Candy.  Sweets, such as  cake, pie, doughnuts, and cookies.  Fried foods.  What foods can I eat? Eat nutrient-rich foods, which will nourish your body and keep you healthy. The food you should eat also will depend on several factors, including:  The calories you need.  The medicines you take.  Your weight.  Your blood glucose level.  Your blood pressure level.  Your cholesterol level.  You should eat a variety of foods, including:  Protein. ? Lean cuts of meat. ? Proteins low in saturated fats, such as fish, egg whites, and beans. Avoid processed meats.  Fruits and vegetables. ? Fruits and vegetables that may help control blood glucose levels, such as apples, mangoes, and yams.  Dairy products. ? Choose fat-free or low-fat dairy products, such as milk, yogurt, and cheese.  Grains, bread, pasta, and rice. ? Choose whole grain products, such as multigrain bread, whole oats, and brown rice. These foods may help control blood pressure.  Fats. ? Foods containing healthful fats, such as nuts, avocado, olive oil, canola oil, and fish.  Does everyone with diabetes mellitus have the same meal plan? Because every person with diabetes mellitus is different, there is not one meal plan that works for everyone. It is very important that you meet with a dietitian who will help you create a meal plan that is just right for you. This information is not intended to replace advice given to you by your health care provider. Make sure you discuss any questions you have with your health care provider. Document Released: 01/14/2005 Document Revised: 09/25/2015 Document Reviewed: 03/16/2013 Elsevier Interactive Patient Education  2017 [employer].

## 2016-12-09 NOTE — Assessment & Plan Note (Addendum)
Above goal last visit. Current ASCVD risk score of 7.5, before lipid draw. Discussed the importance of a healthy diet and regular exercise in order for weight loss, and to reduce the risk of other medical problems. Lipid panel pending today. Consider low dose statin.

## 2016-12-10 ENCOUNTER — Telehealth: Payer: Self-pay | Admitting: Primary Care

## 2016-12-10 NOTE — Telephone Encounter (Signed)
Pt returned your call. She said it is OK to leave msg on vm, she will be in meetings on and off today.

## 2016-12-13 ENCOUNTER — Other Ambulatory Visit: Payer: Self-pay | Admitting: Primary Care

## 2016-12-13 DIAGNOSIS — E119 Type 2 diabetes mellitus without complications: Secondary | ICD-10-CM

## 2016-12-13 DIAGNOSIS — Z794 Long term (current) use of insulin: Principal | ICD-10-CM

## 2016-12-13 MED ORDER — INSULIN GLARGINE 100 UNIT/ML SOLOSTAR PEN: 10.0000 [IU] | PEN_INJECTOR | Freq: Every day | SUBCUTANEOUS | 3 refills | Status: DC

## 2016-12-13 MED ORDER — INSULIN PEN NEEDLE 31G X 6 MM MISC: 3 refills | Status: DC

## 2016-12-13 NOTE — Telephone Encounter (Signed)
Spoken and notified patient of Kate's comments in result note. Patient verbalized understanding.

## 2016-12-13 NOTE — Telephone Encounter (Signed)
Pt returned your call - please call work number [phone] Thanks

## 2016-12-14 ENCOUNTER — Other Ambulatory Visit: Payer: Self-pay | Admitting: *Deleted

## 2016-12-14 DIAGNOSIS — E119 Type 2 diabetes mellitus without complications: Secondary | ICD-10-CM

## 2016-12-14 MED ORDER — EMPAGLIFLOZIN 25 MG PO TABS: 25.0000 mg | ORAL_TABLET | Freq: Every day | ORAL | 2 refills | Status: DC

## 2017-01-27 ENCOUNTER — Other Ambulatory Visit: Payer: Self-pay | Admitting: Primary Care

## 2017-01-27 DIAGNOSIS — I10 Essential (primary) hypertension: Secondary | ICD-10-CM

## 2017-02-03 ENCOUNTER — Encounter: Payer: Self-pay | Admitting: Primary Care

## 2017-02-04 ENCOUNTER — Ambulatory Visit (INDEPENDENT_AMBULATORY_CARE_PROVIDER_SITE_OTHER): Payer: BLUE CROSS/BLUE SHIELD | Admitting: Primary Care

## 2017-02-04 ENCOUNTER — Encounter: Payer: Self-pay | Admitting: Primary Care

## 2017-02-04 VITALS — BP 136/74 | HR 73 | Temp 97.8°F | Wt 169.0 lb

## 2017-02-04 DIAGNOSIS — W57XXXA Bitten or stung by nonvenomous insect and other nonvenomous arthropods, initial encounter: Secondary | ICD-10-CM | POA: Diagnosis not present

## 2017-02-04 DIAGNOSIS — S80862A Insect bite (nonvenomous), left lower leg, initial encounter: Secondary | ICD-10-CM

## 2017-02-04 MED ORDER — SULFAMETHOXAZOLE-TRIMETHOPRIM 800-160 MG PO TABS: 1.0000 | ORAL_TABLET | Freq: Two times a day (BID) | ORAL | 0 refills | Status: DC

## 2017-02-04 NOTE — Progress Notes (Signed)
Subjective:    Patient ID: Melanie Navarro, female    DOB: October 17, 1960, 56 y.o.   MRN: [id number]  HPI  Melanie Navarro is a 56 year old female who presents today with a chief complaint of insect bite. She was bitten by fire ants to the left lower extremity 5 days ago. Since then she's noticed erythema and pustules to her left foot and left lateral calf. Overall she's not noticed any improvement. She's applied neosporin without improvement. She denies fevers, swelling, pain.   Review of Systems  Constitutional: Negative for fever.  Skin: Positive for color change and wound.       Past Medical History:  Diagnosis Date  . Diabetes mellitus   . Hyperlipidemia   . Hypertension      Social History   Social History  . Marital status: Married    Spouse name: N/A  . Number of children: N/A  . Years of education: N/A   Occupational History  . Not on file.   Social History Main Topics  . Smoking status: Never Smoker  . Smokeless tobacco: Never Used  . Alcohol use 0.6 oz/week    1 Standard drinks or equivalent per week  . Drug use: No  . Sexual activity: Not on file   Other Topics Concern  . Not on file   Social History Narrative   Married.   2 children. 1 grandchild.   Works in Science writer.   Enjoys gardening, flowers, spending time with family.    Past Surgical History:  Procedure Laterality Date  . WISDOM TOOTH EXTRACTION  1984    Family History  Problem Relation Age of Onset  . Stroke Mother   . Hypertension Mother   . Heart disease Mother   . Diabetes Mother   . Diabetes Father   . Cancer Father        Lung  . Diabetes Brother   . Colon cancer Neg Hx     Allergies  Allergen Reactions  . Glipizide     dizziness  . Penicillins     Rash   . Sulfur     Rash   . Codeine Swelling and Rash    Current Outpatient Prescriptions on File Prior to Visit  Medication Sig Dispense Refill  . aspirin 81 MG tablet Take 81 mg by mouth daily.    . empagliflozin  (JARDIANCE) 25 MG TABS tablet Take 25 mg by mouth daily. 30 tablet 2  . glucose blood test strip Use as instructed 30 each 1  . Insulin Glargine (LANTUS SOLOSTAR) 100 UNIT/ML Solostar Pen Inject 10 Units into the skin at bedtime. 15 mL 3  . Insulin Pen Needle (COMFORT EZ PEN NEEDLES) 31G X 6 MM MISC Use with insulin every night at bedtime as directed. 100 each 3  . Lancets (ONETOUCH ULTRASOFT) lancets Use as instructed 30 each 1  . losartan (COZAAR) 50 MG tablet TAKE 1 TABLET BY MOUTH EVERY DAY 90 tablet 2  . metFORMIN (GLUCOPHAGE-XR) 750 MG 24 hr tablet Take 2 tablets by mouth daily with breakfast. 180 tablet 1  . metoprolol succinate (TOPROL-XL) 25 MG 24 hr tablet TAKE 1 TABLET (25 MG TOTAL) BY MOUTH DAILY. 90 tablet 2   Current Facility-Administered Medications on File Prior to Visit  Medication Dose Route Frequency Provider Last Rate Last Dose  . 0.9 %  sodium chloride infusion  500 mL Intravenous Continuous Pyrtle, Lajuan Lines, MD        BP 136/74  Pulse 73   Temp 97.8 F (36.6 C) (Oral)   Wt 169 lb (76.7 kg)   SpO2 99%   BMI 28.12 kg/m    Objective:   Physical Exam  Constitutional: She appears well-nourished.  Skin: Skin is warm and dry.  0.5 cm raised, puss filled mass to left dorsal foot. Flatter erythematous bite to left lateral calf at about 0.5 cm. Non tender. Well healing bites to hands.          Assessment & Plan:  Insect Bites:  Bitten by fire ants 5 days ago. Nearly healed to her hands, no improvement to feet and left lower extremity. Exam today suspicious for early cellulitis/infection. Rx for bactrim course x 7 days provided. Allergy to PCN. Discussed warm compresses. Follow up PRN.  Sheral Flow, NP

## 2017-02-04 NOTE — Patient Instructions (Signed)
Start Bactrim DS (sulfamethoxazole/trimethoprim) tablets for skin infection. Take 1 tablet by mouth twice daily for 7 days.  It was a pleasure to see you today!

## 2017-03-22 ENCOUNTER — Other Ambulatory Visit: Payer: Self-pay | Admitting: Primary Care

## 2017-03-22 DIAGNOSIS — E119 Type 2 diabetes mellitus without complications: Secondary | ICD-10-CM

## 2017-04-11 ENCOUNTER — Other Ambulatory Visit: Payer: Self-pay | Admitting: Primary Care

## 2017-04-11 DIAGNOSIS — I10 Essential (primary) hypertension: Secondary | ICD-10-CM

## 2017-06-14 ENCOUNTER — Ambulatory Visit: Payer: BLUE CROSS/BLUE SHIELD | Admitting: Primary Care

## 2017-06-17 ENCOUNTER — Ambulatory Visit (INDEPENDENT_AMBULATORY_CARE_PROVIDER_SITE_OTHER): Payer: BLUE CROSS/BLUE SHIELD | Admitting: Primary Care

## 2017-06-17 ENCOUNTER — Encounter: Payer: Self-pay | Admitting: Primary Care

## 2017-06-17 VITALS — BP 150/82 | HR 78 | Temp 98.4°F | Ht 65.0 in | Wt 173.5 lb

## 2017-06-17 DIAGNOSIS — E119 Type 2 diabetes mellitus without complications: Secondary | ICD-10-CM

## 2017-06-17 DIAGNOSIS — Z23 Encounter for immunization: Secondary | ICD-10-CM | POA: Diagnosis not present

## 2017-06-17 DIAGNOSIS — E785 Hyperlipidemia, unspecified: Secondary | ICD-10-CM | POA: Diagnosis not present

## 2017-06-17 DIAGNOSIS — I10 Essential (primary) hypertension: Secondary | ICD-10-CM | POA: Diagnosis not present

## 2017-06-17 LAB — LIPID PANEL
CHOL/HDL RATIO: 3
Cholesterol: 201 mg/dL — ABNORMAL HIGH (ref 0–200)
HDL: 64.7 mg/dL (ref >39.00)
LDL Cholesterol: 120 mg/dL — ABNORMAL HIGH (ref 0–99)
NONHDL: 136.7
Triglycerides: 82 mg/dL (ref 0.0–149.0)
VLDL: 16.4 mg/dL (ref 0.0–40.0)

## 2017-06-17 LAB — COMPREHENSIVE METABOLIC PANEL
ALT: 19 U/L (ref 0–35)
AST: 18 U/L (ref 0–37)
Albumin: 4.2 g/dL (ref 3.5–5.2)
Alkaline Phosphatase: 67 U/L (ref 39–117)
BILIRUBIN TOTAL: 0.6 mg/dL (ref 0.2–1.2)
BUN: 17 mg/dL (ref 6–23)
CO2: 29 meq/L (ref 19–32)
CREATININE: 0.71 mg/dL (ref 0.40–1.20)
Calcium: 9.3 mg/dL (ref 8.4–10.5)
Chloride: 105 mEq/L (ref 96–112)
GFR: 89.95 mL/min (ref >60.00)
GLUCOSE: 151 mg/dL — AB (ref 70–99)
Potassium: 4.2 mEq/L (ref 3.5–5.1)
Sodium: 141 mEq/L (ref 135–145)
Total Protein: 7 g/dL (ref 6.0–8.3)

## 2017-06-17 LAB — HEMOGLOBIN A1C: Hgb A1c MFr Bld: 10.4 % — ABNORMAL HIGH (ref 4.6–6.5)

## 2017-06-17 MED ORDER — LOSARTAN POTASSIUM 100 MG PO TABS: 100.0000 mg | ORAL_TABLET | Freq: Every day | ORAL | 0 refills | Status: DC

## 2017-06-17 NOTE — Assessment & Plan Note (Addendum)
Overdue for diabetes follow up. Long discussion today about checking glucose levels BID. Continue Lantus 10 units, metformin once daily, and jardiance.   Referral placed to diabetes nutrition. Repeat A1C pending.  Managed on ARB, will likely introduce statin. Follow up in 3 months.

## 2017-06-17 NOTE — Patient Instructions (Addendum)
We've increased your losartan from 50 mg to 100 mg. You may take two of the 50 mg tablets until your current bottle is empty.  Start checking your blood sugars twice daily, in the morning before breakfast and at bedtime. Continue your diabetes medications for now.  Stop by the lab prior to leaving today. I will notify you of your results once received.   Elevate your legs when sitting for prolonged periods of time. Wear the compression hose.  Start exercising. You should be getting 150 minutes of moderate intensity exercise weekly.  You will be contacted regarding your referral to diabetes nutrition.  Please let us know if you have not been contacted within one week.   Please schedule a follow up appointment in 2 weeks for blood pressure check and 3 months for diabetes check.  It was a pleasure to see you today!   Diabetes Mellitus and Nutrition When you have diabetes (diabetes mellitus), it is very important to have healthy eating habits because your blood sugar (glucose) levels are greatly affected by what you eat and drink. Eating healthy foods in the appropriate amounts, at about the same times every day, can help you:  Control your blood glucose.  Lower your risk of heart disease.  Improve your blood pressure.  Reach or maintain a healthy weight.  Every person with diabetes is different, and each person has different needs for a meal plan. Your health care provider may recommend that you work with a diet and nutrition specialist (dietitian) to make a meal plan that is best for you. Your meal plan may vary depending on factors such as:  The calories you need.  The medicines you take.  Your weight.  Your blood glucose, blood pressure, and cholesterol levels.  Your activity level.  Other health conditions you have, such as heart or kidney disease.  How do carbohydrates affect me? Carbohydrates affect your blood glucose level more than any other type of food. Eating  carbohydrates naturally increases the amount of glucose in your blood. Carbohydrate counting is a method for keeping track of how many carbohydrates you eat. Counting carbohydrates is important to keep your blood glucose at a healthy level, especially if you use insulin or take certain oral diabetes medicines. It is important to know how many carbohydrates you can safely have in each meal. This is different for every person. Your dietitian can help you calculate how many carbohydrates you should have at each meal and for snack. Foods that contain carbohydrates include:  Bread, cereal, rice, pasta, and crackers.  Potatoes and corn.  Peas, beans, and lentils.  Milk and yogurt.  Fruit and juice.  Desserts, such as cakes, cookies, ice cream, and candy.  How does alcohol affect me? Alcohol can cause a sudden decrease in blood glucose (hypoglycemia), especially if you use insulin or take certain oral diabetes medicines. Hypoglycemia can be a life-threatening condition. Symptoms of hypoglycemia (sleepiness, dizziness, and confusion) are similar to symptoms of having too much alcohol. If your health care provider says that alcohol is safe for you, follow these guidelines:  Limit alcohol intake to no more than 1 drink per day for nonpregnant women and 2 drinks per day for men. One drink equals 12 oz of beer, 5 oz of wine, or 1 oz of hard liquor.  Do not drink on an empty stomach.  Keep yourself hydrated with water, diet soda, or unsweetened iced tea.  Keep in mind that regular soda, juice, and other mixers may  contain a lot of sugar and must be counted as carbohydrates.  What are tips for following this plan? Reading food labels  Start by checking the serving size on the label. The amount of calories, carbohydrates, fats, and other nutrients listed on the label are based on one serving of the food. Many foods contain more than one serving per package.  Check the total grams (g) of  carbohydrates in one serving. You can calculate the number of servings of carbohydrates in one serving by dividing the total carbohydrates by 15. For example, if a food has 30 g of total carbohydrates, it would be equal to 2 servings of carbohydrates.  Check the number of grams (g) of saturated and trans fats in one serving. Choose foods that have low or no amount of these fats.  Check the number of milligrams (mg) of sodium in one serving. Most people should limit total sodium intake to less than 2,300 mg per day.  Always check the nutrition information of foods labeled as "low-fat" or "nonfat". These foods may be higher in added sugar or refined carbohydrates and should be avoided.  Talk to your dietitian to identify your daily goals for nutrients listed on the label. Shopping  Avoid buying canned, premade, or processed foods. These foods tend to be high in fat, sodium, and added sugar.  Shop around the outside edge of the grocery store. This includes fresh fruits and vegetables, bulk grains, fresh meats, and fresh dairy. Cooking  Use low-heat cooking methods, such as baking, instead of high-heat cooking methods like deep frying.  Cook using healthy oils, such as olive, canola, or sunflower oil.  Avoid cooking with butter, cream, or high-fat meats. Meal planning  Eat meals and snacks regularly, preferably at the same times every day. Avoid going long periods of time without eating.  Eat foods high in fiber, such as fresh fruits, vegetables, beans, and whole grains. Talk to your dietitian about how many servings of carbohydrates you can eat at each meal.  Eat 4-6 ounces of lean protein each day, such as lean meat, chicken, fish, eggs, or tofu. 1 ounce is equal to 1 ounce of meat, chicken, or fish, 1 egg, or 1/4 cup of tofu.  Eat some foods each day that contain healthy fats, such as avocado, nuts, seeds, and fish. Lifestyle   Check your blood glucose regularly.  Exercise at least  30 minutes 5 or more days each week, or as told by your health care provider.  Take medicines as told by your health care provider.  Do not use any products that contain nicotine or tobacco, such as cigarettes and e-cigarettes. If you need help quitting, ask your health care provider.  Work with a Social worker or diabetes educator to identify strategies to manage stress and any emotional and social challenges. What are some questions to ask my health care provider?  Do I need to meet with a diabetes educator?  Do I need to meet with a dietitian?  What number can I call if I have questions?  When are the best times to check my blood glucose? Where to find more information:  American Diabetes Association: diabetes.org/food-and-fitness/food  Academy of Nutrition and Dietetics: PokerClues.dk  [employer] of Diabetes and Digestive and Kidney Diseases (NIH): ContactWire.be Summary  A healthy meal plan will help you control your blood glucose and maintain a healthy lifestyle.  Working with a diet and nutrition specialist (dietitian) can help you make a meal plan that is best  for you.  Keep in mind that carbohydrates and alcohol have immediate effects on your blood glucose levels. It is important to count carbohydrates and to use alcohol carefully. This information is not intended to replace advice given to you by your health care provider. Make sure you discuss any questions you have with your health care provider. Document Released: 01/14/2005 Document Revised: 05/24/2016 Document Reviewed: 05/24/2016 Elsevier Interactive Patient Education  [employer].

## 2017-06-17 NOTE — Progress Notes (Signed)
Subjective:    Patient ID: Melanie Navarro, female    DOB: [birth year], 57 y.o.   MRN: [id number]  HPI  Melanie Navarro is a 57 year old female with a history of hypertension, type 2 diabetes who presents today with a chief complaint of foot swelling, also overdue for follow up.   Her swelling is located to her bilateral feet which is intermittently chronic but worse over the past one week. Her swelling is improved in the morning when she wakes. 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  She's not been checking her blood pressure regularly, but she did check her BP at [facility] earlier this week which was 160/84. She is compliant to her losartan 50 mg and metoprolol succinate 25 mg.  BP Readings from Last 3 Encounters:  06/17/17 (!) 150/82  02/04/17 136/74  12/09/16 140/78     2) Type 2 Diabetes:  Current medications include: Metformin XR 750 mg once daily, Jardiance 25 mg. Lantus 10 units HS   She is checking her blood glucose 1-2 times weekly  and is getting readings of: AM fasting: 140  Last A1C: 12.8 Last Eye Exam: Overdue, she will schedule Last Foot Exam: Completed in August 2018 Pneumonia Vaccination: Completed in 2016 ACE/ARB: Losartan Statin: None, LDL of 152 in August 2018, she declined   Diet currently consists of:  Breakfast: Toast, grilled cheese, boiled egg/bacon Lunch: Vegetables, chicken salad, sandwich Dinner: Pasta, chicken, hamburger, potatoes, toast/bread Snacks: Nuts, cheese, popcorn Desserts: 3-4 times weekly (cookies, fruit, snack cakes) Beverages: Water, un-sweet tea  Exercise: She is not currently exercising.        Review of Systems  Eyes: Negative for visual disturbance.  Respiratory: Negative for shortness of breath.   Cardiovascular: Negative for chest pain.       Bilateral foot swelling  Neurological: Negative for dizziness.       Numbness to left great toe, intermittent       Past Medical History:    Diagnosis Date  . Diabetes mellitus   . Hyperlipidemia   . Hypertension      Social History   Socioeconomic History  . Marital status: Married    Spouse name: Not on file  . Number of children: Not on file  . Years of education: Not on file  . Highest education level: Not on file  Social Needs  . Financial resource strain: Not on file  . Food insecurity - worry: Not on file  . Food insecurity - inability: Not on file  . Transportation needs - medical: Not on file  . Transportation needs - non-medical: Not on file  Occupational History  . Not on file  Tobacco Use  . Smoking status: Never Smoker  . Smokeless tobacco: Never Used  Substance and Sexual Activity  . Alcohol use: Yes    Alcohol/week: 0.6 oz    Types: 1 Standard drinks or equivalent per week  . Drug use: No  . Sexual activity: Not on file  Other Topics Concern  . Not on file  Social History Narrative   Married.   2 children. 1 grandchild.   Works in Science writer.   Enjoys gardening, flowers, spending time with family.    Past Surgical History:  Procedure Laterality Date  . WISDOM TOOTH EXTRACTION  1984    Family History  Problem Relation Age of Onset  . Stroke Mother   . Hypertension Mother   .  Heart disease Mother   . Diabetes Mother   . Diabetes Father   . Cancer Father        Lung  . Diabetes Brother   . Colon cancer Neg Hx     Allergies  Allergen Reactions  . Glipizide     dizziness  . Penicillins     Rash   . Sulfur     Rash   . Codeine Swelling and Rash    Current Outpatient Medications on File Prior to Visit  Medication Sig Dispense Refill  . aspirin 81 MG tablet Take 81 mg by mouth daily.    Marland Kitchen glucose blood test strip Use as instructed 30 each 1  . Insulin Glargine (LANTUS SOLOSTAR) 100 UNIT/ML Solostar Pen Inject 10 Units into the skin at bedtime. 15 mL 3  . Insulin Pen Needle (COMFORT EZ PEN NEEDLES) 31G X 6 MM MISC Use with insulin every night at bedtime as directed. 100 each  3  . JARDIANCE 25 MG TABS tablet TAKE 1 TABLET BY MOUTH EVERY DAY 90 tablet 1  . Lancets (ONETOUCH ULTRASOFT) lancets Use as instructed 30 each 1  . metFORMIN (GLUCOPHAGE-XR) 750 MG 24 hr tablet Take 2 tablets by mouth daily with breakfast. 180 tablet 1  . metoprolol succinate (TOPROL-XL) 25 MG 24 hr tablet TAKE 1 TABLET (25 MG TOTAL) BY MOUTH DAILY. 90 tablet 1   Current Facility-Administered Medications on File Prior to Visit  Medication Dose Route Frequency Provider Last Rate Last Dose  . 0.9 %  sodium chloride infusion  500 mL Intravenous Continuous Pyrtle, Lajuan Lines, MD        BP (!) 150/82   Pulse 78   Temp 98.4 F (36.9 C) (Oral)   Ht 5\' 5"  (1.651 m)   Wt 173 lb 8 oz (78.7 kg)   SpO2 97%   BMI 28.87 kg/m    Objective:   Physical Exam  Constitutional: She appears well-nourished.  Neck: Neck supple.  Cardiovascular: Normal rate and regular rhythm.  Pulses:      Dorsalis pedis pulses are 2+ on the right side, and 2+ on the left side.       Posterior tibial pulses are 2+ on the right side, and 2+ on the left side.  Pulmonary/Chest: Effort normal and breath sounds normal.  Skin: Skin is warm and dry. No erythema.          Assessment & Plan:

## 2017-06-17 NOTE — Assessment & Plan Note (Signed)
Last LDL above goal, repeat lipids today. Will likely need to introduce statin if necessary. Discussed the importance of a healthy diet and regular exercise in order for weight loss, and to reduce the risk of any potential medical problems.

## 2017-06-17 NOTE — Assessment & Plan Note (Signed)
Above goal today, also on prior visits. Increase losartan to 100 mg, continue Toprol XL. Suspect uncontrolled hypertension as cause of foot swelling, good pedal pulses today. Follow up in 2 weeks for BP check.

## 2017-06-17 NOTE — Addendum Note (Signed)
Addended by: Jacqualin Combes on: 06/17/2017 04:06 PM   Modules accepted: Orders

## 2017-06-21 ENCOUNTER — Encounter: Payer: Self-pay | Admitting: Primary Care

## 2017-06-21 DIAGNOSIS — E785 Hyperlipidemia, unspecified: Secondary | ICD-10-CM

## 2017-06-21 DIAGNOSIS — E119 Type 2 diabetes mellitus without complications: Secondary | ICD-10-CM

## 2017-06-21 DIAGNOSIS — Z794 Long term (current) use of insulin: Secondary | ICD-10-CM

## 2017-06-21 MED ORDER — INSULIN GLARGINE 100 UNIT/ML SOLOSTAR PEN: 15.0000 [IU] | PEN_INJECTOR | Freq: Every day | SUBCUTANEOUS | 3 refills | Status: DC

## 2017-06-21 MED ORDER — ATORVASTATIN CALCIUM 20 MG PO TABS: 20.0000 mg | ORAL_TABLET | Freq: Every evening | ORAL | 1 refills | Status: DC

## 2017-06-29 ENCOUNTER — Encounter: Payer: Self-pay | Admitting: Primary Care

## 2017-07-01 ENCOUNTER — Ambulatory Visit (INDEPENDENT_AMBULATORY_CARE_PROVIDER_SITE_OTHER): Payer: BLUE CROSS/BLUE SHIELD | Admitting: Primary Care

## 2017-07-01 ENCOUNTER — Encounter: Payer: Self-pay | Admitting: Primary Care

## 2017-07-01 ENCOUNTER — Other Ambulatory Visit (INDEPENDENT_AMBULATORY_CARE_PROVIDER_SITE_OTHER): Payer: BLUE CROSS/BLUE SHIELD

## 2017-07-01 VITALS — BP 146/82 | HR 73 | Temp 98.1°F | Ht 65.0 in | Wt 171.5 lb

## 2017-07-01 DIAGNOSIS — E119 Type 2 diabetes mellitus without complications: Secondary | ICD-10-CM | POA: Diagnosis not present

## 2017-07-01 DIAGNOSIS — R6889 Other general symptoms and signs: Secondary | ICD-10-CM

## 2017-07-01 DIAGNOSIS — I10 Essential (primary) hypertension: Secondary | ICD-10-CM | POA: Diagnosis not present

## 2017-07-01 LAB — BASIC METABOLIC PANEL
BUN: 22 mg/dL (ref 6–23)
CHLORIDE: 102 meq/L (ref 96–112)
CO2: 28 meq/L (ref 19–32)
CREATININE: 0.76 mg/dL (ref 0.40–1.20)
Calcium: 9.9 mg/dL (ref 8.4–10.5)
GFR: 83.15 mL/min (ref >60.00)
Glucose, Bld: 166 mg/dL — ABNORMAL HIGH (ref 70–99)
Potassium: 4.5 mEq/L (ref 3.5–5.1)
Sodium: 138 mEq/L (ref 135–145)

## 2017-07-01 LAB — TSH: TSH: 4.94 u[IU]/mL — ABNORMAL HIGH (ref 0.35–4.50)

## 2017-07-01 MED ORDER — LOSARTAN POTASSIUM-HCTZ 100-12.5 MG PO TABS: 1.0000 | ORAL_TABLET | Freq: Every day | ORAL | 0 refills | Status: DC

## 2017-07-01 MED ORDER — METFORMIN HCL ER 750 MG PO TB24: ORAL_TABLET | ORAL | 1 refills | Status: DC

## 2017-07-01 NOTE — Patient Instructions (Addendum)
Start losartan-hydrochlorothiazide 100-12.5 mg tablets for high blood pressure. Continue metoprolol succinate 25 mg.  Start monitoring your blood pressure daily, around the same time of day, for the next 2 weeks.  Ensure that you have rested for 30 minutes prior to checking your blood pressure. Record your readings and send them to me on My Chart.  Stop by the lab prior to leaving today. I will notify you of your results once received.   We will see you in May as scheduled. It was a pleasure to see you today!

## 2017-07-01 NOTE — Assessment & Plan Note (Addendum)
Above goal with increase in Losartan to 100 mg. Will change to losartan-HCTZ 100/12.5 mg tablets, continue Toprol XL 25 mg.   BMP pending today. She'll update in 2-3 weeks with BP readings. Check TSH for fatigue and cold intolerance.

## 2017-07-01 NOTE — Progress Notes (Signed)
Subjective:    Patient ID: Melanie Navarro, female    DOB: December 22, 1960, 57 y.o.   MRN: [id number]  HPI  Melanie Navarro is a 57 year old female who presents today for follow up and a chief complaint of cold intolerance.   She last presented on 06/17/17 for follow up with noted BP readings above goal. Her Losartan was increased to 100 mg and her Toprol XL was continued.   BP Readings from Last 3 Encounters:  07/01/17 (!) 146/82  06/17/17 (!) 150/82  02/04/17 136/74    Since her last visit she's compliant to her losartan 100 mg and Toprol XL and has noticed an overall decrease in foot swelling. She's not "felt well", thinks this is secondary to being sedentary as she is not exercising. She's not been checking her blood pressure at home as she does not have a machine. She denies calf/lower extremity edema. She experiences "waves" of chills that occur several times weekly, mostly during the cold weather.   She denies chest pain, dizziness, shortness of breath.   Review of Systems  Constitutional: Positive for fatigue.  Respiratory: Negative for shortness of breath.   Cardiovascular: Negative for chest pain.  Endocrine: Positive for cold intolerance.  Neurological: Negative for dizziness and headaches.       Past Medical History:  Diagnosis Date  . Diabetes mellitus   . Hyperlipidemia   . Hypertension      Social History   Socioeconomic History  . Marital status: Married    Spouse name: Not on file  . Number of children: Not on file  . Years of education: Not on file  . Highest education level: Not on file  Social Needs  . Financial resource strain: Not on file  . Food insecurity - worry: Not on file  . Food insecurity - inability: Not on file  . Transportation needs - medical: Not on file  . Transportation needs - non-medical: Not on file  Occupational History  . Not on file  Tobacco Use  . Smoking status: Never Smoker  . Smokeless tobacco: Never Used  Substance and Sexual  Activity  . Alcohol use: Yes    Alcohol/week: 0.6 oz    Types: 1 Standard drinks or equivalent per week  . Drug use: No  . Sexual activity: Not on file  Other Topics Concern  . Not on file  Social History Narrative   Married.   2 children. 1 grandchild.   Works in Science writer.   Enjoys gardening, flowers, spending time with family.    Past Surgical History:  Procedure Laterality Date  . WISDOM TOOTH EXTRACTION  1984    Family History  Problem Relation Age of Onset  . Stroke Mother   . Hypertension Mother   . Heart disease Mother   . Diabetes Mother   . Diabetes Father   . Cancer Father        Lung  . Diabetes Brother   . Colon cancer Neg Hx     Allergies  Allergen Reactions  . Glipizide     dizziness  . Penicillins     Rash   . Sulfur     Rash   . Codeine Swelling and Rash    Current Outpatient Medications on File Prior to Visit  Medication Sig Dispense Refill  . aspirin 81 MG tablet Take 81 mg by mouth daily.    Marland Kitchen atorvastatin (LIPITOR) 20 MG tablet Take 1 tablet (20 mg total) by mouth every  evening. 90 tablet 1  . glucose blood test strip Use as instructed 30 each 1  . Insulin Glargine (LANTUS SOLOSTAR) 100 UNIT/ML Solostar Pen Inject 15 Units into the skin at bedtime. 15 mL 3  . Insulin Pen Needle (COMFORT EZ PEN NEEDLES) 31G X 6 MM MISC Use with insulin every night at bedtime as directed. 100 each 3  . JARDIANCE 25 MG TABS tablet TAKE 1 TABLET BY MOUTH EVERY DAY 90 tablet 1  . Lancets (ONETOUCH ULTRASOFT) lancets Use as instructed 30 each 1  . metoprolol succinate (TOPROL-XL) 25 MG 24 hr tablet TAKE 1 TABLET (25 MG TOTAL) BY MOUTH DAILY. 90 tablet 1   Current Facility-Administered Medications on File Prior to Visit  Medication Dose Route Frequency Provider Last Rate Last Dose  . 0.9 %  sodium chloride infusion  500 mL Intravenous Continuous Pyrtle, Lajuan Lines, MD        BP (!) 146/82   Pulse 73   Temp 98.1 F (36.7 C) (Oral)   Ht 5\' 5"  (1.651 m)   Wt 171  lb 8 oz (77.8 kg)   SpO2 98%   BMI 28.54 kg/m    Objective:   Physical Exam  Constitutional: She appears well-nourished.  Neck: Neck supple.  Cardiovascular: Normal rate and regular rhythm.  Improved bilateral foot edema. No calf swelling or tenderness.  Pulmonary/Chest: Effort normal and breath sounds normal.  Skin: Skin is warm and dry.  Psychiatric: She has a normal mood and affect.          Assessment & Plan:

## 2017-07-04 LAB — T4, FREE: FREE T4: 0.67 ng/dL (ref 0.60–1.60)

## 2017-07-12 ENCOUNTER — Encounter: Payer: Self-pay | Admitting: Primary Care

## 2017-07-17 ENCOUNTER — Encounter: Payer: Self-pay | Admitting: Primary Care

## 2017-08-01 ENCOUNTER — Encounter: Payer: Self-pay | Admitting: Primary Care

## 2017-08-01 DIAGNOSIS — I10 Essential (primary) hypertension: Secondary | ICD-10-CM

## 2017-08-01 MED ORDER — LOSARTAN POTASSIUM-HCTZ 100-12.5 MG PO TABS: 1.0000 | ORAL_TABLET | Freq: Every day | ORAL | 2 refills | Status: DC

## 2017-09-13 ENCOUNTER — Telehealth: Payer: Self-pay

## 2017-09-13 NOTE — Telephone Encounter (Signed)
appt was diabetic ck; appt cancelled as requested for 09/14/17.

## 2017-09-13 NOTE — Telephone Encounter (Signed)
PLEASE NOTE: All timestamps contained within this report are represented as [place] Standard Time. CONFIDENTIALTY NOTICE: This fax transmission is intended only for the addressee. It contains information that is legally privileged, confidential or otherwise protected from use or disclosure. If you are not the intended recipient, you are strictly prohibited from reviewing, disclosing, copying using or disseminating any of this information or taking any action in reliance on or regarding this information. If you have received this fax in error, please notify us immediately by telephone so that we can arrange for its return to [place]. Phone: [phone], Toll-Free: [phone], Fax: [phone] Page: 1 of 1 Call Id: [id number] [place] [facility] - Client Nonclinical Telephone Record [place] [facility] - Client Client Site [place] Physician Alma Friendly - NP Contact Type Call Who Is Calling Patient / Member / Family / Caregiver Caller Name Sofi Bryars Caller Phone Number [phone] Patient Name Lynnleigh Soden Patient DOB 12-17-1960 Call Type Message Only Information Provided Reason for Call Request to [facility] Appointment Initial Comment Caller states they are needing to cancel an appointment for 09/14/17. Additional Comment Call Closed By: Fanny Bien Transaction Date/Time: 09/12/2017 9:45:49 PM (ET)

## 2017-09-14 ENCOUNTER — Ambulatory Visit: Payer: BLUE CROSS/BLUE SHIELD | Admitting: Primary Care

## 2017-10-04 ENCOUNTER — Other Ambulatory Visit: Payer: Self-pay | Admitting: Primary Care

## 2017-10-04 DIAGNOSIS — I10 Essential (primary) hypertension: Secondary | ICD-10-CM

## 2017-10-26 ENCOUNTER — Other Ambulatory Visit: Payer: Self-pay | Admitting: Primary Care

## 2017-10-26 DIAGNOSIS — E119 Type 2 diabetes mellitus without complications: Secondary | ICD-10-CM

## 2017-12-21 ENCOUNTER — Other Ambulatory Visit: Payer: Self-pay | Admitting: Primary Care

## 2017-12-21 DIAGNOSIS — E119 Type 2 diabetes mellitus without complications: Secondary | ICD-10-CM

## 2017-12-21 DIAGNOSIS — E785 Hyperlipidemia, unspecified: Secondary | ICD-10-CM

## 2018-01-26 LAB — HM DIABETES EYE EXAM

## 2018-01-27 ENCOUNTER — Encounter: Payer: Self-pay | Admitting: Primary Care

## 2018-02-06 ENCOUNTER — Other Ambulatory Visit: Payer: Self-pay | Admitting: Primary Care

## 2018-02-06 DIAGNOSIS — E119 Type 2 diabetes mellitus without complications: Secondary | ICD-10-CM

## 2018-02-06 NOTE — Telephone Encounter (Signed)
She needs a follow up, recheck A1C. Her last A1C was 10

## 2018-02-06 NOTE — Telephone Encounter (Signed)
Please call and schedule appointment as instructed. 

## 2018-02-06 NOTE — Telephone Encounter (Signed)
Last prescribed on 10/27/2017 Last office visit on 07/01/2017

## 2018-02-07 NOTE — Telephone Encounter (Signed)
Pt is scheduled for 02/21/18 with Alma Friendly and would like to see if her Vania Rea can be sent in to last her until that appt.

## 2018-02-08 MED ORDER — EMPAGLIFLOZIN 25 MG PO TABS: ORAL_TABLET | ORAL | 0 refills | Status: DC

## 2018-02-08 NOTE — Telephone Encounter (Signed)
Noted, refill sent to pharmacy for 30 days supply until seen.

## 2018-02-08 NOTE — Addendum Note (Signed)
Addended by: Jacqualin Combes on: 02/08/2018 04:37 PM   Modules accepted: Orders

## 2018-02-08 NOTE — Addendum Note (Signed)
Addended by: Pleas Koch on: 02/08/2018 04:51 PM   Modules accepted: Orders

## 2018-02-08 NOTE — Telephone Encounter (Signed)
Spoken and notified patient of Melanie Navarro's comments. Patient verbalized understanding.  

## 2018-02-21 ENCOUNTER — Ambulatory Visit: Payer: BLUE CROSS/BLUE SHIELD | Admitting: Primary Care

## 2018-03-26 ENCOUNTER — Other Ambulatory Visit: Payer: Self-pay | Admitting: Primary Care

## 2018-03-26 DIAGNOSIS — E119 Type 2 diabetes mellitus without complications: Secondary | ICD-10-CM

## 2018-03-28 NOTE — Telephone Encounter (Signed)
Patient needs office visit for diabetes follow-up.  Please schedule.

## 2018-03-28 NOTE — Telephone Encounter (Signed)
Last prescribed on 02/08/2018 Last office visit on 07/01/2017. Patient cancel the appointment on follow up 02/21/2018

## 2018-03-29 NOTE — Telephone Encounter (Signed)
Lvm asking pt to call office 

## 2018-04-06 ENCOUNTER — Encounter: Payer: Self-pay | Admitting: Primary Care

## 2018-04-06 NOTE — Telephone Encounter (Signed)
Unable to reach pt. Mailed letter. 

## 2018-04-11 ENCOUNTER — Other Ambulatory Visit: Payer: Self-pay | Admitting: Primary Care

## 2018-05-15 ENCOUNTER — Telehealth: Payer: Self-pay | Admitting: Primary Care

## 2018-05-15 ENCOUNTER — Other Ambulatory Visit: Payer: Self-pay | Admitting: Primary Care

## 2018-05-15 DIAGNOSIS — E119 Type 2 diabetes mellitus without complications: Secondary | ICD-10-CM

## 2018-05-15 DIAGNOSIS — I10 Essential (primary) hypertension: Secondary | ICD-10-CM

## 2018-05-15 MED ORDER — LOSARTAN POTASSIUM-HCTZ 100-12.5 MG PO TABS: 1.0000 | ORAL_TABLET | Freq: Every day | ORAL | 0 refills | Status: DC

## 2018-05-15 MED ORDER — EMPAGLIFLOZIN 25 MG PO TABS: ORAL_TABLET | ORAL | 0 refills | Status: DC

## 2018-05-15 NOTE — Telephone Encounter (Signed)
meds refilled once

## 2018-05-15 NOTE — Telephone Encounter (Signed)
Pt scheduled an appointment for 1/16 to refill meds. She states she is out of her Jardiance and Losartan. She's wondering if we can go ahead and refill these. Please advise.

## 2018-05-18 ENCOUNTER — Encounter: Payer: Self-pay | Admitting: Primary Care

## 2018-05-18 ENCOUNTER — Ambulatory Visit: Payer: BLUE CROSS/BLUE SHIELD | Admitting: Primary Care

## 2018-05-18 ENCOUNTER — Telehealth: Payer: Self-pay | Admitting: *Deleted

## 2018-05-18 ENCOUNTER — Ambulatory Visit (INDEPENDENT_AMBULATORY_CARE_PROVIDER_SITE_OTHER): Payer: BLUE CROSS/BLUE SHIELD | Admitting: Primary Care

## 2018-05-18 VITALS — BP 142/80 | HR 70 | Temp 97.6°F | Ht 65.0 in | Wt 178.0 lb

## 2018-05-18 DIAGNOSIS — E119 Type 2 diabetes mellitus without complications: Secondary | ICD-10-CM | POA: Diagnosis not present

## 2018-05-18 DIAGNOSIS — I10 Essential (primary) hypertension: Secondary | ICD-10-CM | POA: Diagnosis not present

## 2018-05-18 DIAGNOSIS — R7989 Other specified abnormal findings of blood chemistry: Secondary | ICD-10-CM | POA: Diagnosis not present

## 2018-05-18 DIAGNOSIS — E785 Hyperlipidemia, unspecified: Secondary | ICD-10-CM

## 2018-05-18 DIAGNOSIS — Z794 Long term (current) use of insulin: Secondary | ICD-10-CM

## 2018-05-18 DIAGNOSIS — Z23 Encounter for immunization: Secondary | ICD-10-CM | POA: Diagnosis not present

## 2018-05-18 DIAGNOSIS — E039 Hypothyroidism, unspecified: Secondary | ICD-10-CM | POA: Insufficient documentation

## 2018-05-18 LAB — COMPREHENSIVE METABOLIC PANEL
ALT: 23 U/L (ref 0–35)
AST: 21 U/L (ref 0–37)
Albumin: 4.3 g/dL (ref 3.5–5.2)
Alkaline Phosphatase: 75 U/L (ref 39–117)
BILIRUBIN TOTAL: 0.6 mg/dL (ref 0.2–1.2)
BUN: 19 mg/dL (ref 6–23)
CO2: 29 mEq/L (ref 19–32)
Calcium: 9.5 mg/dL (ref 8.4–10.5)
Chloride: 102 mEq/L (ref 96–112)
Creatinine, Ser: 0.78 mg/dL (ref 0.40–1.20)
GFR: 80.45 mL/min (ref >60.00)
Glucose, Bld: 197 mg/dL — ABNORMAL HIGH (ref 70–99)
Potassium: 4.3 mEq/L (ref 3.5–5.1)
Sodium: 138 mEq/L (ref 135–145)
Total Protein: 7.3 g/dL (ref 6.0–8.3)

## 2018-05-18 LAB — POCT GLYCOSYLATED HEMOGLOBIN (HGB A1C): HEMOGLOBIN A1C: 11.2 % — AB (ref 4.0–5.6)

## 2018-05-18 LAB — TSH: TSH: 6.92 u[IU]/mL — AB (ref 0.35–4.50)

## 2018-05-18 LAB — LIPID PANEL
Cholesterol: 232 mg/dL — ABNORMAL HIGH (ref 0–200)
HDL: 72.1 mg/dL (ref >39.00)
LDL Cholesterol: 138 mg/dL — ABNORMAL HIGH (ref 0–99)
NonHDL: 159.95
Total CHOL/HDL Ratio: 3
Triglycerides: 110 mg/dL (ref 0.0–149.0)
VLDL: 22 mg/dL (ref 0.0–40.0)

## 2018-05-18 LAB — T4, FREE: Free T4: 0.77 ng/dL (ref 0.60–1.60)

## 2018-05-18 MED ORDER — LOSARTAN POTASSIUM-HCTZ 100-25 MG PO TABS: 1.0000 | ORAL_TABLET | Freq: Every day | ORAL | 3 refills | Status: DC

## 2018-05-18 MED ORDER — METFORMIN HCL ER 750 MG PO TB24: 1500.0000 mg | ORAL_TABLET | Freq: Every day | ORAL | 3 refills | Status: DC

## 2018-05-18 MED ORDER — INSULIN GLARGINE 100 UNIT/ML SOLOSTAR PEN: 20.0000 [IU] | PEN_INJECTOR | Freq: Every day | SUBCUTANEOUS | 3 refills | Status: DC

## 2018-05-18 NOTE — Assessment & Plan Note (Signed)
Above goal in the office today, also on prior visits. Did resume losartan-HCTZ two days ago. Given history of uncontrolled diabetes and hyperlipidemia, will get more aggressive with BP control.  Increase losartan-HCTZ to 100-25 mg daily.  Follow up in 2 weeks for re-evaluation.  BMP pending.

## 2018-05-18 NOTE — Assessment & Plan Note (Signed)
Uncontrolled with A1C today of 11.2. Long discussion today regarding the importance of following up as recommended. She has not been seen in nearly one year which is unacceptable for uncontrolled diabetes.   Increase Metformin to 1500 mg daily. Melanie Navarro is currently under PA. Increase Lantus to 20 units. Stressed the importance of checking glucose 2 times daily, rotating checks. Pneumonia vaccination UTD. Managed on ARB. Cannot tolerate Lipitor, will trial Crestor but will need to wait for lipids to return.  Follow up in 2-3 weeks with glucose logs.

## 2018-05-18 NOTE — Addendum Note (Signed)
Addended by: Tammi Sou on: 05/18/2018 09:42 AM   Modules accepted: Orders

## 2018-05-18 NOTE — Assessment & Plan Note (Signed)
Noted on prior labs, repeat TSH and Free T4 pending.

## 2018-05-18 NOTE — Patient Instructions (Signed)
Stop by the lab prior to leaving today. I will notify you of your results once received.   You will be contacted regarding your referral to the nutritionist.  Please let us know if you have not been contacted within one week.   We've increased the dose of your Lantus to 20 units.   We've increased the dose of your metformin to 1500 mg daily. Take two of the 750 mg tablets.  We've increased the dose of your losartan-HCTZ to 100-25 mg. Take once daily. Continue your metoprolol succinate.  You must start checking your glucose levels. Rotate times of checks. Check before any meal, 2 hours after any meal, bedtime.  Schedule a follow up visit for 2-3 weeks for blood pressure check and blood sugar check.  It was a pleasure to see you today!   Diabetes Mellitus and Nutrition, Adult When you have diabetes (diabetes mellitus), it is very important to have healthy eating habits because your blood sugar (glucose) levels are greatly affected by what you eat and drink. Eating healthy foods in the appropriate amounts, at about the same times every day, can help you:  Control your blood glucose.  Lower your risk of heart disease.  Improve your blood pressure.  Reach or maintain a healthy weight. Every person with diabetes is different, and each person has different needs for a meal plan. Your health care provider may recommend that you work with a diet and nutrition specialist (dietitian) to make a meal plan that is best for you. Your meal plan may vary depending on factors such as:  The calories you need.  The medicines you take.  Your weight.  Your blood glucose, blood pressure, and cholesterol levels.  Your activity level.  Other health conditions you have, such as heart or kidney disease. How do carbohydrates affect me? Carbohydrates, also called carbs, affect your blood glucose level more than any other type of food. Eating carbs naturally raises the amount of glucose in your blood.  Carb counting is a method for keeping track of how many carbs you eat. Counting carbs is important to keep your blood glucose at a healthy level, especially if you use insulin or take certain oral diabetes medicines. It is important to know how many carbs you can safely have in each meal. This is different for every person. Your dietitian can help you calculate how many carbs you should have at each meal and for each snack. Foods that contain carbs include:  Bread, cereal, rice, pasta, and crackers.  Potatoes and corn.  Peas, beans, and lentils.  Milk and yogurt.  Fruit and juice.  Desserts, such as cakes, cookies, ice cream, and candy. How does alcohol affect me? Alcohol can cause a sudden decrease in blood glucose (hypoglycemia), especially if you use insulin or take certain oral diabetes medicines. Hypoglycemia can be a life-threatening condition. Symptoms of hypoglycemia (sleepiness, dizziness, and confusion) are similar to symptoms of having too much alcohol. If your health care provider says that alcohol is safe for you, follow these guidelines:  Limit alcohol intake to no more than 1 drink per day for nonpregnant women and 2 drinks per day for men. One drink equals 12 oz of beer, 5 oz of wine, or 1 oz of hard liquor.  Do not drink on an empty stomach.  Keep yourself hydrated with water, diet soda, or unsweetened iced tea.  Keep in mind that regular soda, juice, and other mixers may contain a lot of sugar and must  be counted as carbs. What are tips for following this plan?  Reading food labels  Start by checking the serving size on the "Nutrition Facts" label of packaged foods and drinks. The amount of calories, carbs, fats, and other nutrients listed on the label is based on one serving of the item. Many items contain more than one serving per package.  Check the total grams (g) of carbs in one serving. You can calculate the number of servings of carbs in one serving by  dividing the total carbs by 15. For example, if a food has 30 g of total carbs, it would be equal to 2 servings of carbs.  Check the number of grams (g) of saturated and trans fats in one serving. Choose foods that have low or no amount of these fats.  Check the number of milligrams (mg) of salt (sodium) in one serving. Most people should limit total sodium intake to less than 2,300 mg per day.  Always check the nutrition information of foods labeled as "low-fat" or "nonfat". These foods may be higher in added sugar or refined carbs and should be avoided.  Talk to your dietitian to identify your daily goals for nutrients listed on the label. Shopping  Avoid buying canned, premade, or processed foods. These foods tend to be high in fat, sodium, and added sugar.  Shop around the outside edge of the grocery store. This includes fresh fruits and vegetables, bulk grains, fresh meats, and fresh dairy. Cooking  Use low-heat cooking methods, such as baking, instead of high-heat cooking methods like deep frying.  Cook using healthy oils, such as olive, canola, or sunflower oil.  Avoid cooking with butter, cream, or high-fat meats. Meal planning  Eat meals and snacks regularly, preferably at the same times every day. Avoid going long periods of time without eating.  Eat foods high in fiber, such as fresh fruits, vegetables, beans, and whole grains. Talk to your dietitian about how many servings of carbs you can eat at each meal.  Eat 4-6 ounces (oz) of lean protein each day, such as lean meat, chicken, fish, eggs, or tofu. One oz of lean protein is equal to: ? 1 oz of meat, chicken, or fish. ? 1 egg. ?  cup of tofu.  Eat some foods each day that contain healthy fats, such as avocado, nuts, seeds, and fish. Lifestyle  Check your blood glucose regularly.  Exercise regularly as told by your health care provider. This may include: ? 150 minutes of moderate-intensity or vigorous-intensity  exercise each week. This could be brisk walking, biking, or water aerobics. ? Stretching and doing strength exercises, such as yoga or weightlifting, at least 2 times a week.  Take medicines as told by your health care provider.  Do not use any products that contain nicotine or tobacco, such as cigarettes and e-cigarettes. If you need help quitting, ask your health care provider.  Work with a Social worker or diabetes educator to identify strategies to manage stress and any emotional and social challenges. Questions to ask a health care provider  Do I need to meet with a diabetes educator?  Do I need to meet with a dietitian?  What number can I call if I have questions?  When are the best times to check my blood glucose? Where to find more information:  American Diabetes Association: diabetes.org  Academy of Nutrition and Dietetics: www.eatright.[employer] of Diabetes and Digestive and Kidney Diseases (NIH): [employer] Summary  A healthy meal  plan will help you control your blood glucose and maintain a healthy lifestyle.  Working with a diet and nutrition specialist (dietitian) can help you make a meal plan that is best for you.  Keep in mind that carbohydrates (carbs) and alcohol have immediate effects on your blood glucose levels. It is important to count carbs and to use alcohol carefully. This information is not intended to replace advice given to you by your health care provider. Make sure you discuss any questions you have with your health care provider. Document Released: 01/14/2005 Document Revised: 11/17/2016 Document Reviewed: 05/24/2016 Elsevier Interactive Patient Education  2019 [employer].

## 2018-05-18 NOTE — Progress Notes (Signed)
Subjective:    Patient ID: Melanie Navarro, female    DOB: [birth year], 58 y.o.   MRN: [id number]  HPI  [name] is a 58 year old female who presents today for follow up.  1) Type 2 Diabetes:   Current prescribed medications include: Metformin ER 750 mg daily, Jardiance 25 mg daly, Lantus 15 units HS.  She ran out of her Vania Rea four days ago.   She is checking her blood glucose 0 times daily.   Last A1C: 10.4 in February 2019 Last Eye Exam: Due in September 2020 Last Foot Exam: Due today Pneumonia Vaccination: Due in 2021 ACE/ARB: Losartan Statin: atorvastatin   Diet currently consists of:  Breakfast: Boiled egg, toast, biscuit Lunch: [place] sandwich, salad, left overs Dinner: Meat, some vegetable, starch  Snacks: Nuts, chips Desserts: 3-4 days weekly  Beverages: Diet soda, unsweet tea, coffee, little water  Exercise: She is not exercising, was previously walking at the gym three days weekly   2) Essential Hypertension: Currently managed on losartan-HCTZ 100-12.5 mg, metoprolol succinate 25 mg. She does not check her BP at home but has felt that her BP has been higher. She resumed her losartan-HCTZ two days ago as she ran out of refills.   BP Readings from Last 3 Encounters:  07/01/17 (!) 146/82  06/17/17 (!) 150/82  02/04/17 136/74     3) Hyperlipidemia: Currently managed on atorvastatin 20 mg. Her last lipid panel was in February 2019 with LDL of 120. She stopped taking her atorvastatin several months ago. She cannot tolerate the atorvastatin due to body aches/myalgias.   4) Abnormal TSH: Noted in March 2019 with TSH of 4.94, free T4 of 0.67. It was recommended to her that we repeat thyroid levels in May 2019 but she did not return.   Review of Systems  Constitutional: Positive for fatigue.  Respiratory: Negative for shortness of breath.   Cardiovascular: Positive for palpitations. Negative for chest pain.  Neurological: Negative for dizziness, numbness and  headaches.       Past Medical History:  Diagnosis Date  . Diabetes mellitus   . Hyperlipidemia   . Hypertension      Social History   Socioeconomic History  . Marital status: Married    Spouse name: Not on file  . Number of children: Not on file  . Years of education: Not on file  . Highest education level: Not on file  Occupational History  . Not on file  Social Needs  . Financial resource strain: Not on file  . Food insecurity:    Worry: Not on file    Inability: Not on file  . Transportation needs:    Medical: Not on file    Non-medical: Not on file  Tobacco Use  . Smoking status: Never Smoker  . Smokeless tobacco: Never Used  Substance and Sexual Activity  . Alcohol use: Yes    Alcohol/week: 1.0 standard drinks    Types: 1 Standard drinks or equivalent per week  . Drug use: No  . Sexual activity: Not on file  Lifestyle  . Physical activity:    Days per week: Not on file    Minutes per session: Not on file  . Stress: Not on file  Relationships  . Social connections:    Talks on phone: Not on file    Gets together: Not on file    Attends religious service: Not on file    Active member of club or organization: Not on file  Attends meetings of clubs or organizations: Not on file    Relationship status: Not on file  . Intimate partner violence:    Fear of current or ex partner: Not on file    Emotionally abused: Not on file    Physically abused: Not on file    Forced sexual activity: Not on file  Other Topics Concern  . Not on file  Social History Narrative   Married.   2 children. 1 grandchild.   Works in Science [employer].   Enjoys gardening, flowers, spending time with family.    Past Surgical History:  Procedure Laterality Date  . WISDOM TOOTH EXTRACTION  1984    Family History  Problem Relation Age of Onset  . Stroke Mother   . Hypertension Mother   . Heart disease Mother   . Diabetes Mother   . Diabetes Father   . Cancer Father        Lung    . Diabetes Brother   . Colon cancer Neg Hx     Allergies  Allergen Reactions  . Glipizide     dizziness  . Penicillins     Rash   . Sulfur     Rash   . Codeine Swelling and Rash    Current Outpatient Medications on File Prior to Visit  Medication Sig Dispense Refill  . aspirin 81 MG tablet Take 81 mg by mouth daily.    Marland Kitchen atorvastatin (LIPITOR) 20 MG tablet TAKE 1 TABLET BY MOUTH EVERY DAY IN THE EVENING 90 tablet 1  . BD PEN NEEDLE MICRO U/F 32G X 6 MM MISC USE WITH INSULIN EVERY NIGHT AT BEDTIME AS DIRECTED 100 each 2  . glucose blood test strip Use as instructed 30 each 1  . Insulin Glargine (LANTUS SOLOSTAR) 100 UNIT/ML Solostar Pen Inject 15 Units into the skin at bedtime. 15 mL 3  . Lancets (ONETOUCH ULTRASOFT) lancets Use as instructed 30 each 1  . metoprolol succinate (TOPROL-XL) 25 MG 24 hr tablet TAKE 1 TABLET (25 MG TOTAL) BY MOUTH DAILY. 90 tablet 1  . JARDIANCE 25 MG TABS tablet TAKE 1 TABLET BY MOUTH ONCE DAILY FOR DIABETES. (Patient not taking: Reported on 05/18/2018) 30 tablet 1   No current facility-administered medications on file prior to visit.     BP (!) 142/80 (BP Location: Left Arm, Patient Position: Sitting, Cuff Size: Normal)   Pulse 70   Temp 97.6 F (36.4 C) (Oral)   Ht 5\' 5"  (1.651 m)   Wt 178 lb (80.7 kg)   SpO2 98%   BMI 29.62 kg/m    Objective:   Physical Exam  Constitutional: She appears well-nourished.  Neck: Neck supple.  Cardiovascular: Normal rate and regular rhythm.  Respiratory: Effort normal and breath sounds normal.  Skin: Skin is warm and dry.  Psychiatric: She has a normal mood and affect.           Assessment & Plan:

## 2018-05-18 NOTE — Telephone Encounter (Signed)
Received fax from [facility] requesting PA for Jardiance 25 mg.  Tried completing PA on CoverMyMeds.  Per [facility] drug is covered by current benefit plan.  No further PA activity needed.  [facility] notified of this via fax.

## 2018-05-18 NOTE — Assessment & Plan Note (Signed)
Uncontrolled on prior labs, cannot tolerate atorvastatin. Repeat lipids pending. Will trial Crestor and dose based off of labs.  The 10-year ASCVD risk score Mikey Bussing DC Brooke Bonito., et al., 2013) is: 7.5%   Values used to calculate the score:     Age: 58 years     Sex: Female     Is Non-Hispanic African American: No     Diabetic: Yes     Tobacco smoker: No     Systolic Blood Pressure: 158 mmHg     Is BP treated: Yes     HDL Cholesterol: 64.7 mg/dL     Total Cholesterol: 201 mg/dL

## 2018-05-21 ENCOUNTER — Other Ambulatory Visit: Payer: Self-pay | Admitting: Primary Care

## 2018-05-21 DIAGNOSIS — E785 Hyperlipidemia, unspecified: Secondary | ICD-10-CM

## 2018-05-21 MED ORDER — ROSUVASTATIN CALCIUM 5 MG PO TABS: 5.0000 mg | ORAL_TABLET | Freq: Every day | ORAL | 3 refills | Status: DC

## 2018-05-23 DIAGNOSIS — E039 Hypothyroidism, unspecified: Secondary | ICD-10-CM

## 2018-05-23 MED ORDER — LEVOTHYROXINE SODIUM 25 MCG PO TABS: ORAL_TABLET | ORAL | 1 refills | Status: DC

## 2018-06-06 ENCOUNTER — Other Ambulatory Visit: Payer: Self-pay | Admitting: Primary Care

## 2018-06-06 DIAGNOSIS — I10 Essential (primary) hypertension: Secondary | ICD-10-CM

## 2018-06-08 ENCOUNTER — Ambulatory Visit (INDEPENDENT_AMBULATORY_CARE_PROVIDER_SITE_OTHER): Payer: BLUE CROSS/BLUE SHIELD | Admitting: Primary Care

## 2018-06-08 ENCOUNTER — Encounter: Payer: Self-pay | Admitting: Primary Care

## 2018-06-08 VITALS — BP 140/82 | HR 71 | Temp 97.7°F | Ht 65.0 in | Wt 173.5 lb

## 2018-06-08 DIAGNOSIS — I10 Essential (primary) hypertension: Secondary | ICD-10-CM | POA: Diagnosis not present

## 2018-06-08 DIAGNOSIS — E785 Hyperlipidemia, unspecified: Secondary | ICD-10-CM

## 2018-06-08 DIAGNOSIS — E039 Hypothyroidism, unspecified: Secondary | ICD-10-CM

## 2018-06-08 DIAGNOSIS — E119 Type 2 diabetes mellitus without complications: Secondary | ICD-10-CM | POA: Diagnosis not present

## 2018-06-08 MED ORDER — METOPROLOL SUCCINATE ER 50 MG PO TB24: 50.0000 mg | ORAL_TABLET | Freq: Every day | ORAL | 0 refills | Status: DC

## 2018-06-08 NOTE — Progress Notes (Signed)
Subjective:    Patient ID: Melanie Navarro, female    DOB: [birth year], 58 y.o.   MRN: [id number]  HPI  [name] is a 58 year old female who presents today for follow up of diabetes, hypothyrodism, and hypertension.  She was last evaluated on 05/18/18. Was found to be hypertensive and to have uncontrolled diabetes.  1) Essential Hypertension: Noted from last visit. She endorsed resuming losartan-HCTZ two days prior. Given BP readings and medical history we increased her dose of Losartan-HCTZ to 100-25 mg and her metoprolol succinate 25 mg.   Since her last visit she's not checked her BP at home. She has noticed some palpitations at night, also with some esophageal reflux that occurs at the same time.  BP Readings from Last 3 Encounters:  06/08/18 140/82  05/18/18 (!) 142/80  07/01/17 (!) 146/82   2) Type 2 Diabetes:  Current medications include: Metformin ER 1500 mg daily, Lantus 20 units HS, Jardiance 25 mg.  She is checking her blood glucose 2-3 times daily and is getting readings of:  AM fasting: 130's-150's Before Dinner: 150's-160's  Last A1C: 11.2 in January 2020 Last Eye Exam: Completed in September 2019 Last Foot Exam: Due today Pneumonia Vaccination: UTD ACE/ARB: Losartan Statin: Crestor  3) Hypothyroidism: Prior labs with elevation in TSH. Last visit with an increase in TSH to 6.92 from 4.94 over the last one year. She endorsed chronic fatigue. Given these readings she was initiated on levothyroxine 25 mcg daily.  Since her last visit she is taking her levothyroxine every morning with water only. She waiting to take other medications or eating for one hour. She is not taking vitamins or PPI's     Review of Systems  Constitutional: Negative for fatigue.  Respiratory: Negative for shortness of breath.   Cardiovascular: Negative for chest pain and leg swelling.  Gastrointestinal:       GERD  Neurological: Negative for dizziness and headaches.       Past  Medical History:  Diagnosis Date  . Diabetes mellitus   . Hyperlipidemia   . Hypertension      Social History   Socioeconomic History  . Marital status: Married    Spouse name: Not on file  . Number of children: Not on file  . Years of education: Not on file  . Highest education level: Not on file  Occupational History  . Not on file  Social Needs  . Financial resource strain: Not on file  . Food insecurity:    Worry: Not on file    Inability: Not on file  . Transportation needs:    Medical: Not on file    Non-medical: Not on file  Tobacco Use  . Smoking status: Never Smoker  . Smokeless tobacco: Never Used  Substance and Sexual Activity  . Alcohol use: Yes    Alcohol/week: 1.0 standard drinks    Types: 1 Standard drinks or equivalent per week  . Drug use: No  . Sexual activity: Not on file  Lifestyle  . Physical activity:    Days per week: Not on file    Minutes per session: Not on file  . Stress: Not on file  Relationships  . Social connections:    Talks on phone: Not on file    Gets together: Not on file    Attends religious service: Not on file    Active member of club or organization: Not on file    Attends meetings of clubs or organizations:  Not on file    Relationship status: Not on file  . Intimate partner violence:    Fear of current or ex partner: Not on file    Emotionally abused: Not on file    Physically abused: Not on file    Forced sexual activity: Not on file  Other Topics Concern  . Not on file  Social History Narrative   Married.   2 children. 1 grandchild.   Works in Science writer.   Enjoys gardening, flowers, spending time with family.    Past Surgical History:  Procedure Laterality Date  . WISDOM TOOTH EXTRACTION  1984    Family History  Problem Relation Age of Onset  . Stroke Mother   . Hypertension Mother   . Heart disease Mother   . Diabetes Mother   . Diabetes Father   . Cancer Father        Lung  . Diabetes Brother   .  Colon cancer Neg Hx     Allergies  Allergen Reactions  . Glipizide     dizziness  . Penicillins     Rash   . Sulfur     Rash   . Codeine Swelling and Rash    Current Outpatient Medications on File Prior to Visit  Medication Sig Dispense Refill  . aspirin 81 MG tablet Take 81 mg by mouth daily.    Marland Kitchen atorvastatin (LIPITOR) 20 MG tablet TAKE 1 TABLET BY MOUTH EVERY DAY IN THE EVENING 90 tablet 1  . BD PEN NEEDLE MICRO U/F 32G X 6 MM MISC USE WITH INSULIN EVERY NIGHT AT BEDTIME AS DIRECTED 100 each 2  . glucose blood test strip Use as instructed 30 each 1  . Insulin Glargine (LANTUS SOLOSTAR) 100 UNIT/ML Solostar Pen Inject 20 Units into the skin at bedtime. 15 mL 3  . JARDIANCE 25 MG TABS tablet TAKE 1 TABLET BY MOUTH ONCE DAILY FOR DIABETES. 30 tablet 1  . Lancets (ONETOUCH ULTRASOFT) lancets Use as instructed 30 each 1  . levothyroxine (SYNTHROID, LEVOTHROID) 25 MCG tablet Take 1 tablet by mouth every morning on empty stomach with water only.  No food or other medications for 30 minutes. 30 tablet 1  . losartan-hydrochlorothiazide (HYZAAR) 100-25 MG tablet Take 1 tablet by mouth daily. For blood pressure. 90 tablet 3  . metFORMIN (GLUCOPHAGE-XR) 750 MG 24 hr tablet Take 2 tablets (1,500 mg total) by mouth daily with breakfast. For diabetes. 180 tablet 3  . metoprolol succinate (TOPROL-XL) 25 MG 24 hr tablet TAKE 1 TABLET (25 MG TOTAL) BY MOUTH DAILY. 90 tablet 1  . rosuvastatin (CRESTOR) 5 MG tablet Take 1 tablet (5 mg total) by mouth daily. For cholesterol. 90 tablet 3   No current facility-administered medications on file prior to visit.     BP 140/82   Pulse 71   Temp 97.7 F (36.5 C) (Oral)   Ht 5\' 5"  (1.651 m)   Wt 173 lb 8 oz (78.7 kg)   SpO2 99%   BMI 28.87 kg/m    Objective:   Physical Exam  Constitutional: She appears well-nourished.  Neck: Neck supple.  Cardiovascular: Normal rate and regular rhythm.  Respiratory: Effort normal and breath sounds normal.    Skin: Skin is warm and dry.           Assessment & Plan:

## 2018-06-08 NOTE — Patient Instructions (Signed)
We've increased your metoprolol succinate to 50 mg. I sent a new prescription to your pharmacy.  Continue Jardiance and Metformin as prescribed. Continue Lantus 20 units at bedtime.  Continue to check your blood sugars as discussed. Notify me if you see readings at or above 200 consistently.  Schedule a lab only appointment for 4 weeks to recheck thyroid function and cholesterol. Make sure to fast for 4 hours prior.  Schedule a follow up visit with me for on or after April 16th.  It was a pleasure to see you today!

## 2018-06-08 NOTE — Assessment & Plan Note (Signed)
Compliant to Crestor 5 mg, no side effects. Repeat lipids in 4 weeks.

## 2018-06-08 NOTE — Assessment & Plan Note (Signed)
Compliant to prescribed regimen. Glucose readings improved. Continue current regimen, no changes made today.  Foot exam today. Compliant to statin. Managed on ARB.  Follow up in 3 months.

## 2018-06-08 NOTE — Assessment & Plan Note (Signed)
Improved but above goal. Continue losartan-HCTZ 100-25, increase metoprolol succinate to 50 mg.  She will update with BP and HR readings.

## 2018-06-08 NOTE — Assessment & Plan Note (Signed)
Feeling improved overall, less fatigue. Compliant to levothyroxine and is taking appropriately. Continue same. Repeat TSH in 4 weeks.

## 2018-06-14 ENCOUNTER — Other Ambulatory Visit: Payer: Self-pay | Admitting: Primary Care

## 2018-06-14 DIAGNOSIS — E039 Hypothyroidism, unspecified: Secondary | ICD-10-CM

## 2018-06-14 NOTE — Telephone Encounter (Signed)
Last prescribed on 05/23/2018. Last office visit on 06/08/2018. Next future appointment on 08/17/2018

## 2018-06-14 NOTE — Telephone Encounter (Signed)
I sent a 60 day supply on 05/23/18, she shouldn't need a refill yet.

## 2018-06-19 ENCOUNTER — Other Ambulatory Visit: Payer: Self-pay | Admitting: Primary Care

## 2018-06-19 DIAGNOSIS — I10 Essential (primary) hypertension: Secondary | ICD-10-CM

## 2018-06-26 ENCOUNTER — Other Ambulatory Visit: Payer: Self-pay | Admitting: Primary Care

## 2018-06-26 DIAGNOSIS — E039 Hypothyroidism, unspecified: Secondary | ICD-10-CM

## 2018-06-26 DIAGNOSIS — E785 Hyperlipidemia, unspecified: Secondary | ICD-10-CM

## 2018-07-06 ENCOUNTER — Other Ambulatory Visit: Payer: BLUE CROSS/BLUE SHIELD

## 2018-07-11 ENCOUNTER — Other Ambulatory Visit (INDEPENDENT_AMBULATORY_CARE_PROVIDER_SITE_OTHER): Payer: BLUE CROSS/BLUE SHIELD

## 2018-07-11 ENCOUNTER — Other Ambulatory Visit: Payer: Self-pay | Admitting: Primary Care

## 2018-07-11 DIAGNOSIS — E039 Hypothyroidism, unspecified: Secondary | ICD-10-CM | POA: Diagnosis not present

## 2018-07-11 DIAGNOSIS — E785 Hyperlipidemia, unspecified: Secondary | ICD-10-CM | POA: Diagnosis not present

## 2018-07-11 LAB — LIPID PANEL
Cholesterol: 146 mg/dL (ref 0–200)
HDL: 58.2 mg/dL (ref >39.00)
LDL Cholesterol: 64 mg/dL (ref 0–99)
NonHDL: 87.48
Total CHOL/HDL Ratio: 3
Triglycerides: 115 mg/dL (ref 0.0–149.0)
VLDL: 23 mg/dL (ref 0.0–40.0)

## 2018-07-11 LAB — HEPATIC FUNCTION PANEL
ALT: 16 U/L (ref 0–35)
AST: 17 U/L (ref 0–37)
Albumin: 4.4 g/dL (ref 3.5–5.2)
Alkaline Phosphatase: 58 U/L (ref 39–117)
Bilirubin, Direct: 0.1 mg/dL (ref 0.0–0.3)
Total Bilirubin: 0.5 mg/dL (ref 0.2–1.2)
Total Protein: 7.1 g/dL (ref 6.0–8.3)

## 2018-07-11 LAB — TSH: TSH: 5.87 u[IU]/mL — ABNORMAL HIGH (ref 0.35–4.50)

## 2018-07-11 MED ORDER — LEVOTHYROXINE SODIUM 50 MCG PO TABS: ORAL_TABLET | ORAL | 1 refills | Status: DC

## 2018-07-14 ENCOUNTER — Other Ambulatory Visit: Payer: Self-pay | Admitting: Primary Care

## 2018-07-14 DIAGNOSIS — E039 Hypothyroidism, unspecified: Secondary | ICD-10-CM

## 2018-08-09 ENCOUNTER — Other Ambulatory Visit: Payer: Self-pay | Admitting: Primary Care

## 2018-08-09 DIAGNOSIS — E119 Type 2 diabetes mellitus without complications: Secondary | ICD-10-CM

## 2018-08-17 ENCOUNTER — Ambulatory Visit: Payer: BLUE CROSS/BLUE SHIELD | Admitting: Primary Care

## 2018-09-07 ENCOUNTER — Other Ambulatory Visit: Payer: Self-pay | Admitting: Primary Care

## 2018-09-07 DIAGNOSIS — E039 Hypothyroidism, unspecified: Secondary | ICD-10-CM

## 2018-09-08 ENCOUNTER — Other Ambulatory Visit: Payer: Self-pay | Admitting: Primary Care

## 2018-09-08 DIAGNOSIS — I10 Essential (primary) hypertension: Secondary | ICD-10-CM

## 2018-09-30 ENCOUNTER — Other Ambulatory Visit: Payer: Self-pay | Admitting: Primary Care

## 2018-09-30 DIAGNOSIS — E119 Type 2 diabetes mellitus without complications: Secondary | ICD-10-CM

## 2018-10-06 ENCOUNTER — Other Ambulatory Visit: Payer: Self-pay | Admitting: Primary Care

## 2018-10-06 DIAGNOSIS — E039 Hypothyroidism, unspecified: Secondary | ICD-10-CM

## 2018-10-19 NOTE — Telephone Encounter (Signed)
Left message asking pt to call office 6/18/rbh

## 2018-11-04 ENCOUNTER — Other Ambulatory Visit: Payer: Self-pay | Admitting: Primary Care

## 2018-11-04 DIAGNOSIS — E119 Type 2 diabetes mellitus without complications: Secondary | ICD-10-CM

## 2018-11-17 ENCOUNTER — Other Ambulatory Visit: Payer: Self-pay | Admitting: Primary Care

## 2018-11-17 DIAGNOSIS — E119 Type 2 diabetes mellitus without complications: Secondary | ICD-10-CM

## 2018-11-18 ENCOUNTER — Other Ambulatory Visit: Payer: Self-pay | Admitting: Primary Care

## 2018-11-18 DIAGNOSIS — Z794 Long term (current) use of insulin: Secondary | ICD-10-CM

## 2018-11-18 DIAGNOSIS — E119 Type 2 diabetes mellitus without complications: Secondary | ICD-10-CM

## 2018-11-22 ENCOUNTER — Encounter: Payer: Self-pay | Admitting: Primary Care

## 2018-11-22 ENCOUNTER — Other Ambulatory Visit: Payer: Self-pay

## 2018-11-22 ENCOUNTER — Ambulatory Visit (INDEPENDENT_AMBULATORY_CARE_PROVIDER_SITE_OTHER): Payer: BC Managed Care – PPO | Admitting: Primary Care

## 2018-11-22 VITALS — BP 128/82 | HR 76 | Temp 98.8°F | Ht 65.0 in | Wt 175.5 lb

## 2018-11-22 DIAGNOSIS — G8929 Other chronic pain: Secondary | ICD-10-CM

## 2018-11-22 DIAGNOSIS — E785 Hyperlipidemia, unspecified: Secondary | ICD-10-CM

## 2018-11-22 DIAGNOSIS — I10 Essential (primary) hypertension: Secondary | ICD-10-CM | POA: Diagnosis not present

## 2018-11-22 DIAGNOSIS — M545 Low back pain, unspecified: Secondary | ICD-10-CM

## 2018-11-22 DIAGNOSIS — E039 Hypothyroidism, unspecified: Secondary | ICD-10-CM | POA: Diagnosis not present

## 2018-11-22 DIAGNOSIS — E119 Type 2 diabetes mellitus without complications: Secondary | ICD-10-CM

## 2018-11-22 DIAGNOSIS — M549 Dorsalgia, unspecified: Secondary | ICD-10-CM | POA: Insufficient documentation

## 2018-11-22 DIAGNOSIS — Z794 Long term (current) use of insulin: Secondary | ICD-10-CM

## 2018-11-22 LAB — POCT GLYCOSYLATED HEMOGLOBIN (HGB A1C): Hemoglobin A1C: 9.7 % — AB (ref 4.0–5.6)

## 2018-11-22 LAB — TSH: TSH: 5.71 u[IU]/mL — ABNORMAL HIGH (ref 0.35–4.50)

## 2018-11-22 MED ORDER — METFORMIN HCL ER 750 MG PO TB24: 750.0000 mg | ORAL_TABLET | Freq: Every day | ORAL | 3 refills | Status: DC

## 2018-11-22 MED ORDER — LANTUS SOLOSTAR 100 UNIT/ML ~~LOC~~ SOPN: 25.0000 [IU] | PEN_INJECTOR | Freq: Every day | SUBCUTANEOUS | 3 refills | Status: DC

## 2018-11-22 MED ORDER — CYCLOBENZAPRINE HCL 5 MG PO TABS: ORAL_TABLET | ORAL | 0 refills | Status: DC

## 2018-11-22 NOTE — Progress Notes (Signed)
Subjective:    Patient ID: Melanie Navarro, female    DOB: 1960-10-28, 58 y.o.   MRN: [id number]  HPI  Ms. Trenkamp is a 58 year old female who presents today for follow up.  1) Type 2 Diabetes: Current medications include: Metformin ER 750 mg daily, Jardiance 25 mg daily, Lantus 20 units HS.  She is checking her blood glucose 0 times daily as her glucometer is broken. She has not been exercising, thinks that she's been eating healthier.   Last A1C: 11.2 in January 2020 Last Eye Exam: UTD Last Foot Exam:  UTD Pneumonia Vaccination: Completed in 2016 ACE/ARB: Losartan  Statin: Crestor  2) Hypothyroidism: Currently managed on levothyroxine Last TSH of 5.87 in March 2020, levothyroxine increased to 50 mcg at that time. She did not return as requested for repeat TSH. She is taking her levothyroxine every morning with water, no food or other medications for at least 30 minutes.   BP Readings from Last 3 Encounters:  11/22/18 128/82  06/08/18 140/82  05/18/18 (!) 142/80   3) Chronic Back Pain: Intermittent for years, worse with sitting. She was once managed on cyclobenzaprine for which she took very sparingly for increased symptoms. She would like a refill of this today.   Review of Systems  Respiratory: Negative for shortness of breath.   Cardiovascular: Negative for chest pain.  Gastrointestinal: Negative for abdominal pain and diarrhea.  Neurological: Negative for dizziness.       Past Medical History:  Diagnosis Date  . Diabetes mellitus   . Hyperlipidemia   . Hypertension      Social History   Socioeconomic History  . Marital status: Married    Spouse name: Not on file  . Number of children: Not on file  . Years of education: Not on file  . Highest education level: Not on file  Occupational History  . Not on file  Social Needs  . Financial resource strain: Not on file  . Food insecurity    Worry: Not on file    Inability: Not on file  . Transportation needs   Medical: Not on file    Non-medical: Not on file  Tobacco Use  . Smoking status: Never Smoker  . Smokeless tobacco: Never Used  Substance and Sexual Activity  . Alcohol use: Yes    Alcohol/week: 1.0 standard drinks    Types: 1 Standard drinks or equivalent per week  . Drug use: No  . Sexual activity: Not on file  Lifestyle  . Physical activity    Days per week: Not on file    Minutes per session: Not on file  . Stress: Not on file  Relationships  . Social Herbalist on phone: Not on file    Gets together: Not on file    Attends religious service: Not on file    Active member of club or organization: Not on file    Attends meetings of clubs or organizations: Not on file    Relationship status: Not on file  . Intimate partner violence    Fear of current or ex partner: Not on file    Emotionally abused: Not on file    Physically abused: Not on file    Forced sexual activity: Not on file  Other Topics Concern  . Not on file  Social History Narrative   Married.   2 children. 1 grandchild.   Works in [employer].   Enjoys gardening, flowers, spending time with  family.    Past Surgical History:  Procedure Laterality Date  . WISDOM TOOTH EXTRACTION  1984    Family History  Problem Relation Age of Onset  . Stroke Mother   . Hypertension Mother   . Heart disease Mother   . Diabetes Mother   . Diabetes Father   . Cancer Father        Lung  . Diabetes Brother   . Colon cancer Neg Hx     Allergies  Allergen Reactions  . Glipizide     dizziness  . Penicillins     Rash   . Sulfur     Rash   . Codeine Swelling and Rash    Current Outpatient Medications on File Prior to Visit  Medication Sig Dispense Refill  . aspirin 81 MG tablet Take 81 mg by mouth daily.    . BD PEN NEEDLE MICRO U/F 32G X 6 MM MISC USE WITH INSULIN EVERY NIGHT AT BEDTIME AS DIRECTED 100 each 2  . glucose blood test strip Use as instructed 30 each 1  . Insulin Glargine (LANTUS  SOLOSTAR) 100 UNIT/ML Solostar Pen Inject 20 Units into the skin at bedtime. NEED FOLLOW UP APPOINTMENT 15 mL 0  . JARDIANCE 25 MG TABS tablet TAKE 1 TABLET BY MOUTH ONCE DAILY FOR DIABETES. 30 tablet 0  . Lancets (ONETOUCH ULTRASOFT) lancets Use as instructed 30 each 1  . levothyroxine (SYNTHROID) 50 MCG tablet TAKE 1TAB EVERY MORNING ON EMPTY STOMACH WITH WATER ONLY. NO FOOD OR OTHER MEDICINES FOR 30 MINUTES. 30 tablet 0  . losartan-hydrochlorothiazide (HYZAAR) 100-25 MG tablet Take 1 tablet by mouth daily. For blood pressure. 90 tablet 3  . metFORMIN (GLUCOPHAGE-XR) 750 MG 24 hr tablet Take 2 tablets (1,500 mg total) by mouth daily with breakfast. For diabetes. (Patient taking differently: Take 750 mg by mouth daily with breakfast. For diabetes.) 180 tablet 3  . metoprolol succinate (TOPROL-XL) 50 MG 24 hr tablet For blood pressure. Due for a follow up appointment 90 tablet 0  . rosuvastatin (CRESTOR) 5 MG tablet Take 1 tablet (5 mg total) by mouth daily. For cholesterol. 90 tablet 3   No current facility-administered medications on file prior to visit.     BP 128/82   Pulse 76   Temp 98.8 F (37.1 C) (Temporal)   Ht 5\' 5"  (1.651 m)   Wt 175 lb 8 oz (79.6 kg)   SpO2 100%   BMI 29.20 kg/m    Objective:   Physical Exam  Constitutional: She appears well-nourished.  Neck: Neck supple.  Cardiovascular: Normal rate and regular rhythm.  Respiratory: Effort normal and breath sounds normal.  Skin: Skin is warm and dry.  Psychiatric: She has a normal mood and affect.           Assessment & Plan:

## 2018-11-22 NOTE — Assessment & Plan Note (Signed)
Chronic to bilateral lower back for years, does well on infrequent doses of cyclobenzaprine, continue same.

## 2018-11-22 NOTE — Assessment & Plan Note (Signed)
Stable in the office today, continue current regimen. 

## 2018-11-22 NOTE — Assessment & Plan Note (Signed)
Slight improvement in A1C at 9.7 but still above goal. Discussed that it is imperative that she start checking glucose readings when injecting insulin, she will get a new machine.  Increase Lantus to 25 units for now. I am reticent to get too aggressive given that we have no information on glucose readings. Continue other oral medications.  Will have her send me glucose readings in 2-3 weeks.  Repeat A1C in 3 months.

## 2018-11-22 NOTE — Patient Instructions (Signed)
We've increased your Lantus to 25 units once nightly.   Continue taking Jardiance and Metformin as prescribed.  It's very important to check your blood sugars at least twice daily as discussed. Please purchase a new meter.  Please send me your blood sugar readings in 2-3 weeks.  Please schedule a follow up appointment in 3 months for diabetes check.  It was a pleasure to see you today!

## 2018-11-22 NOTE — Assessment & Plan Note (Signed)
Taking levothyroxine 50 mcg appropriately.  Repeat TSH pending. She will need refills once we receive lab results.

## 2018-11-23 ENCOUNTER — Other Ambulatory Visit: Payer: Self-pay | Admitting: Primary Care

## 2018-11-23 DIAGNOSIS — E039 Hypothyroidism, unspecified: Secondary | ICD-10-CM

## 2018-11-23 MED ORDER — LEVOTHYROXINE SODIUM 75 MCG PO TABS: ORAL_TABLET | ORAL | 1 refills | Status: DC

## 2018-12-05 ENCOUNTER — Other Ambulatory Visit: Payer: Self-pay | Admitting: Primary Care

## 2018-12-05 DIAGNOSIS — I10 Essential (primary) hypertension: Secondary | ICD-10-CM

## 2018-12-13 ENCOUNTER — Other Ambulatory Visit: Payer: Self-pay | Admitting: Primary Care

## 2018-12-13 DIAGNOSIS — E119 Type 2 diabetes mellitus without complications: Secondary | ICD-10-CM

## 2018-12-23 ENCOUNTER — Other Ambulatory Visit: Payer: Self-pay | Admitting: Primary Care

## 2018-12-23 DIAGNOSIS — I10 Essential (primary) hypertension: Secondary | ICD-10-CM

## 2018-12-26 ENCOUNTER — Other Ambulatory Visit: Payer: Self-pay | Admitting: Primary Care

## 2018-12-26 DIAGNOSIS — I10 Essential (primary) hypertension: Secondary | ICD-10-CM

## 2019-01-10 ENCOUNTER — Other Ambulatory Visit: Payer: Self-pay | Admitting: Family Medicine

## 2019-01-10 ENCOUNTER — Other Ambulatory Visit (INDEPENDENT_AMBULATORY_CARE_PROVIDER_SITE_OTHER): Payer: BC Managed Care – PPO

## 2019-01-10 DIAGNOSIS — E039 Hypothyroidism, unspecified: Secondary | ICD-10-CM

## 2019-01-10 LAB — TSH: TSH: 1.72 u[IU]/mL (ref 0.35–4.50)

## 2019-01-14 ENCOUNTER — Other Ambulatory Visit: Payer: Self-pay | Admitting: Primary Care

## 2019-01-14 DIAGNOSIS — E039 Hypothyroidism, unspecified: Secondary | ICD-10-CM

## 2019-02-23 ENCOUNTER — Telehealth: Payer: Self-pay

## 2019-02-23 NOTE — Telephone Encounter (Signed)
[place] Night - Client Nonclinical Telephone Record AccessNurse Client [place] Night - Client Client Site [place] Physician Alma Friendly - NP Contact Type Call Who Is Calling Patient / Member / Family / Caregiver Caller Name Briyona Coyt Caller Phone Number [phone] Patient Name Lital Zapp Patient DOB 05/15/1960 Call Type Message Only Information Provided Reason for Call Request to [facility] Appointment Initial Comment Caller needs to cancel an appointment. Additional Comment Appointment: Monday 02/26/2019, 8:40 AM. Will call tomorrow to reschedule. Call Closed By: Marinus Maw Transaction Date/Time: 02/22/2019 11:04:54 PM (ET)

## 2019-02-26 ENCOUNTER — Ambulatory Visit: Payer: BC Managed Care – PPO | Admitting: Primary Care

## 2019-03-06 ENCOUNTER — Other Ambulatory Visit: Payer: Self-pay | Admitting: Primary Care

## 2019-03-06 MED ORDER — BD PEN NEEDLE MICRO U/F 32G X 6 MM MISC: 2 refills | Status: DC

## 2019-04-14 ENCOUNTER — Other Ambulatory Visit: Payer: Self-pay | Admitting: Primary Care

## 2019-04-14 DIAGNOSIS — E119 Type 2 diabetes mellitus without complications: Secondary | ICD-10-CM

## 2019-04-17 ENCOUNTER — Other Ambulatory Visit: Payer: Self-pay | Admitting: Primary Care

## 2019-04-17 DIAGNOSIS — E119 Type 2 diabetes mellitus without complications: Secondary | ICD-10-CM

## 2019-04-19 ENCOUNTER — Other Ambulatory Visit: Payer: Self-pay | Admitting: Primary Care

## 2019-04-19 ENCOUNTER — Encounter: Payer: Self-pay | Admitting: Internal Medicine

## 2019-04-19 DIAGNOSIS — E039 Hypothyroidism, unspecified: Secondary | ICD-10-CM

## 2019-04-23 ENCOUNTER — Other Ambulatory Visit: Payer: Self-pay | Admitting: Primary Care

## 2019-04-23 DIAGNOSIS — I10 Essential (primary) hypertension: Secondary | ICD-10-CM

## 2019-04-29 DIAGNOSIS — Z20828 Contact with and (suspected) exposure to other viral communicable diseases: Secondary | ICD-10-CM | POA: Diagnosis not present

## 2019-04-29 DIAGNOSIS — R509 Fever, unspecified: Secondary | ICD-10-CM | POA: Diagnosis not present

## 2019-05-02 ENCOUNTER — Other Ambulatory Visit: Payer: Self-pay

## 2019-05-02 ENCOUNTER — Emergency Department: Payer: BC Managed Care – PPO

## 2019-05-02 ENCOUNTER — Emergency Department
Admission: EM | Admit: 2019-05-02 | Discharge: 2019-05-02 | Disposition: A | Discharge status: Home / Self Care | Payer: BC Managed Care – PPO | Attending: Emergency Medicine | Admitting: Emergency Medicine

## 2019-05-02 DIAGNOSIS — Z20828 Contact with and (suspected) exposure to other viral communicable diseases: Secondary | ICD-10-CM | POA: Diagnosis not present

## 2019-05-02 DIAGNOSIS — J069 Acute upper respiratory infection, unspecified: Secondary | ICD-10-CM | POA: Diagnosis not present

## 2019-05-02 DIAGNOSIS — Z79899 Other long term (current) drug therapy: Secondary | ICD-10-CM | POA: Insufficient documentation

## 2019-05-02 DIAGNOSIS — E039 Hypothyroidism, unspecified: Secondary | ICD-10-CM | POA: Insufficient documentation

## 2019-05-02 DIAGNOSIS — Z7982 Long term (current) use of aspirin: Secondary | ICD-10-CM | POA: Insufficient documentation

## 2019-05-02 DIAGNOSIS — R05 Cough: Secondary | ICD-10-CM | POA: Diagnosis not present

## 2019-05-02 DIAGNOSIS — E119 Type 2 diabetes mellitus without complications: Secondary | ICD-10-CM | POA: Diagnosis not present

## 2019-05-02 DIAGNOSIS — B9789 Other viral agents as the cause of diseases classified elsewhere: Secondary | ICD-10-CM | POA: Diagnosis not present

## 2019-05-02 DIAGNOSIS — Z794 Long term (current) use of insulin: Secondary | ICD-10-CM | POA: Diagnosis not present

## 2019-05-02 DIAGNOSIS — R509 Fever, unspecified: Secondary | ICD-10-CM | POA: Diagnosis not present

## 2019-05-02 DIAGNOSIS — I10 Essential (primary) hypertension: Secondary | ICD-10-CM | POA: Diagnosis not present

## 2019-05-02 LAB — SARS CORONAVIRUS 2 (TAT 6-24 HRS): SARS Coronavirus 2: NEGATIVE

## 2019-05-02 LAB — COMPREHENSIVE METABOLIC PANEL
ALT: 18 U/L (ref 0–44)
AST: 14 U/L — ABNORMAL LOW (ref 15–41)
Albumin: 4.2 g/dL (ref 3.5–5.0)
Alkaline Phosphatase: 65 U/L (ref 38–126)
Anion gap: 13 (ref 5–15)
BUN: 19 mg/dL (ref 6–20)
CO2: 24 mmol/L (ref 22–32)
Calcium: 8.9 mg/dL (ref 8.9–10.3)
Chloride: 97 mmol/L — ABNORMAL LOW (ref 98–111)
Creatinine, Ser: 0.63 mg/dL (ref 0.44–1.00)
GFR calc Af Amer: >60 mL/min (ref >60)
GFR calc non Af Amer: >60 mL/min (ref >60)
Glucose, Bld: 238 mg/dL — ABNORMAL HIGH (ref 70–99)
Potassium: 3.6 mmol/L (ref 3.5–5.1)
Sodium: 134 mmol/L — ABNORMAL LOW (ref 135–145)
Total Bilirubin: 0.8 mg/dL (ref 0.3–1.2)
Total Protein: 7.6 g/dL (ref 6.5–8.1)

## 2019-05-02 LAB — CBC WITH DIFFERENTIAL/PLATELET
Abs Immature Granulocytes: 0.02 10*3/uL (ref 0.00–0.07)
Basophils Absolute: 0 10*3/uL (ref 0.0–0.1)
Basophils Relative: 0 %
Eosinophils Absolute: 0 10*3/uL (ref 0.0–0.5)
Eosinophils Relative: 0 %
HCT: 41.7 % (ref 36.0–46.0)
Hemoglobin: 14.6 g/dL (ref 12.0–15.0)
Immature Granulocytes: 0 %
Lymphocytes Relative: 19 %
Lymphs Abs: 1.3 10*3/uL (ref 0.7–4.0)
MCH: 31.3 pg (ref 26.0–34.0)
MCHC: 35 g/dL (ref 30.0–36.0)
MCV: 89.3 fL (ref 80.0–100.0)
Monocytes Absolute: 0.4 10*3/uL (ref 0.1–1.0)
Monocytes Relative: 5 %
Neutro Abs: 5.1 10*3/uL (ref 1.7–7.7)
Neutrophils Relative %: 76 %
Platelets: 247 10*3/uL (ref 150–400)
RBC: 4.67 MIL/uL (ref 3.87–5.11)
RDW: 11.3 % — ABNORMAL LOW (ref 11.5–15.5)
WBC: 6.8 10*3/uL (ref 4.0–10.5)
nRBC: 0 % (ref 0.0–0.2)

## 2019-05-02 LAB — TROPONIN I (HIGH SENSITIVITY): Troponin I (High Sensitivity): <2 ng/L (ref <18)

## 2019-05-02 NOTE — ED Triage Notes (Signed)
Pt c/o cough fever bodyaches, chest discomfort since Saturday was tested negative on Sunday , sx started on 12/25

## 2019-05-02 NOTE — ED Notes (Signed)
Says she has been sick since christmas day.  Says bad headache for last 5 days with chest pain and short of breath.  Says she came today because she woke up with the chest pain last night and she feels like she is more short of breath.

## 2019-05-02 NOTE — ED Provider Notes (Signed)
[facility] Emergency Department Provider Note  ____________________________________________   First MD Initiated Contact with Patient 05/02/19 0809     (approximate)  I have reviewed the triage vital signs and the nursing notes.   HISTORY  Chief Complaint URI   HPI Melanie Navarro is a 58 y.o. female resents to the ED with complaint of symptoms for 4 days of cough and chills.  Patient states she is unaware of any fever.  She denies any known Covid exposure.  She denies nausea, vomiting or diarrhea.  There is been no change in taste or smell.  Patient complains of some anterior chest wall pain with mild reflux.  She states that her chest feels tight.  She denies any previous cardiac history.  Patient is a non-smoker.  She states that she was seen at an urgent care and tested Sunday for Covid which was negative.  This is the fifth day of her symptoms and does not seem to be improving.  She rates her pain as an 8 out of 10.      Past Medical History:  Diagnosis Date  . Diabetes mellitus   . Hyperlipidemia   . Hypertension     Patient Active Problem List   Diagnosis Date Noted  . Chronic back pain 11/22/2018  . Hypothyroidism 05/18/2018  . Hyperlipidemia 12/09/2016  . Insomnia 12/31/2015  . Hypertension 10/27/2011  . Encounter for preventive health examination 10/27/2011  . Diabetes mellitus ([place]) 10/27/2011    Past Surgical History:  Procedure Laterality Date  . [place]    Prior to Admission medications   Medication Sig Start Date End Date Taking? Authorizing Provider  aspirin 81 MG tablet Take 81 mg by mouth daily.   Yes [provider]  Insulin Pen Needle (BD PEN NEEDLE MICRO U/F) 32G X 6 MM MISC USE WITH INSULIN EVERY NIGHT AT BEDTIME AS DIRECTED 03/06/19  Yes Pleas Koch, NP  JARDIANCE 25 MG TABS tablet TAKE 1 TABLET BY MOUTH ONCE DAILY FOR DIABETES. 04/18/19  Yes Pleas Koch, NP  LANTUS SOLOSTAR  100 UNIT/ML Solostar Pen INJECT 25 UNITS INTO THE SKIN AT BEDTIME. (NEED APPT**) 04/18/19  Yes Pleas Koch, NP  levothyroxine (SYNTHROID) 75 MCG tablet TAKE 1 TABLET BY MOUTH EVERY MORNING ON AN EMPTY STOMACH. NO FOOD OR OTHER MEDICATIONS FOR 30 MINUTES. 04/19/19  Yes Pleas Koch, NP  losartan-hydrochlorothiazide (HYZAAR) 100-25 MG tablet Take 1 tablet by mouth daily. For blood pressure. 05/18/18  Yes Pleas Koch, NP  metFORMIN (GLUCOPHAGE-XR) 750 MG 24 hr tablet Take 1 tablet (750 mg total) by mouth daily with breakfast. For diabetes. 11/22/18  Yes Pleas Koch, NP  metoprolol succinate (TOPROL-XL) 50 MG 24 hr tablet TAKE 1 TABLET (50 MG TOTAL) BY MOUTH DAILY. FOR BLOOD PRESSURE. 04/23/19  Yes Pleas Koch, NP  rosuvastatin (CRESTOR) 5 MG tablet Take 1 tablet (5 mg total) by mouth daily. For cholesterol. 05/21/18  Yes Pleas Koch, NP  cyclobenzaprine (FLEXERIL) 5 MG tablet Take 1 tablet by mouth once daily as needed for muscle spasms. 11/22/18   Pleas Koch, NP  glucose blood test strip Use as instructed 06/09/15   Rubbie Battiest, RN  Lancets [facility] ULTRASOFT) lancets Use as instructed 06/09/15   Rubbie Battiest, RN    Allergies Glipizide, Penicillins, Sulfur, and Codeine  Family History  Problem Relation Age of Onset  . Stroke Mother   . Hypertension Mother   .  Heart disease Mother   . Diabetes Mother   . Diabetes Father   . Cancer Father        Lung  . Diabetes Brother   . Colon cancer Neg Hx     Social History Social History   Tobacco Use  . Smoking status: Never Smoker  . Smokeless tobacco: Never Used  Substance Use Topics  . Alcohol use: Yes    Alcohol/week: 1.0 standard drinks    Types: 1 Standard drinks or equivalent per week  . Drug use: No    Review of Systems Constitutional: No fever/positive chills Eyes: No visual changes. ENT: Positive for "dry throat". Cardiovascular: Positive for chest pain. Respiratory: Denies  shortness of breath.  Positive for cough. Gastrointestinal: No abdominal pain.  No nausea, no vomiting.  No diarrhea.  Positive for reflux.  Musculoskeletal: Positive for muscle aches. Skin: Negative for rash. Neurological: Negative for headaches, focal weakness or numbness. ___________________________________________   PHYSICAL EXAM:  VITAL SIGNS: ED Triage Vitals  Enc Vitals Group     BP 05/02/19 0751 (!) 146/87     Pulse Rate 05/02/19 0751 87     Resp 05/02/19 0751 17     Temp 05/02/19 0835 98.5 F (36.9 C)     Temp Source 05/02/19 0751 Oral     SpO2 05/02/19 0751 99 %     Weight 05/02/19 0752 180 lb (81.6 kg)     Height 05/02/19 0752 5\' 5"  (1.651 m)     Head Circumference --      Peak Flow --      Pain Score 05/02/19 0752 8     Pain Loc --      Pain Edu? --      Excl. in [place]? --    Constitutional: Alert and oriented. Well appearing and in no acute distress. Eyes: Conjunctivae are normal.  Head: Atraumatic. Nose: No congestion/rhinnorhea. Neck: No stridor.   Cardiovascular: Normal rate, regular rhythm. Grossly normal heart sounds.  Good peripheral circulation. Respiratory: Normal respiratory effort.  No retractions. Lungs CTAB. Gastrointestinal: Soft and nontender. No distention.  Musculoskeletal: Moves upper and lower extremities that any difficulty.  Normal gait was noted. Neurologic:  Normal speech and language. No gross focal neurologic deficits are appreciated. No gait instability. Skin:  Skin is warm, dry and intact. No rash noted. Psychiatric: Mood and affect are normal. Speech and behavior are normal.  ____________________________________________   LABS (all labs ordered are listed, but only abnormal results are displayed)  Labs Reviewed  CBC WITH DIFFERENTIAL/PLATELET - Abnormal; Notable for the following components:      Result Value   RDW 11.3 (*)    All other components within normal limits  COMPREHENSIVE METABOLIC PANEL - Abnormal; Notable for the  following components:   Sodium 134 (*)    Chloride 97 (*)    Glucose, Bld 238 (*)    AST 14 (*)    All other components within normal limits  SARS CORONAVIRUS 2 (TAT 6-24 HRS)  TROPONIN I (HIGH SENSITIVITY)  TROPONIN I (HIGH SENSITIVITY)   ____________________________________________  EKG  EKG was reviewed by Dr. Jimmye Norman. ____________________________________________  RADIOLOGY  Official radiology report(s): DG Chest Portable 1 View  Result Date: 05/02/2019 CLINICAL DATA:  Cough, fever EXAM: PORTABLE CHEST 1 VIEW COMPARISON:  10/05/2011 FINDINGS: Heart and mediastinal contours are within normal limits. No focal opacities or effusions. No acute bony abnormality. IMPRESSION: No active disease. Electronically Signed   By: Rolm Baptise M.D.   On:  05/02/2019 08:36    ____________________________________________   PROCEDURES  Procedure(s) performed (including Critical Care):  Procedures   ____________________________________________   INITIAL IMPRESSION / ASSESSMENT AND PLAN / ED COURSE  As part of my medical decision making, I reviewed the following data within the electronic MEDICAL RECORD NUMBER Notes from prior ED visits and [place] Controlled Substance Database Melanie Navarro was evaluated in Emergency Department on 05/02/2019 for the symptoms described in the history of present illness. She was evaluated in the context of the global COVID-19 pandemic, which necessitated consideration that the patient might be at risk for infection with the SARS-CoV-2 virus that causes COVID-19. Institutional protocols and algorithms that pertain to the evaluation of patients at risk for COVID-19 are in a state of rapid change based on information released by regulatory bodies including the CDC and federal and state organizations. These policies and algorithms were followed during the patient's care in the ED.  Patient presents to the ED with complaint of cough, fever, body aches for last  5 days.   Patient states that she was tested Saturday at an urgent care and told it was negative however she is worried that this was not an accurate test.  She states that symptoms started on 04/27/2019.  She is unaware of any exposure to Covid.  Lab work was unremarkable with the exception of her glucose which was elevated to 38.  Patient is diabetic and states that she has not taken her medication today.  She also "ate things over Christmas".  Chest x-ray was unremarkable and reassuring.  Patient was told that she should quarantine until the results of her Covid test has been received.  She is aware that if her test is positive she will need to quarantine an additional 10 days.  She was told to return to the emergency department if any shortness of breath or difficulty breathing.  ____________________________________________   FINAL CLINICAL IMPRESSION(S) / ED DIAGNOSES  Final diagnoses:  Viral URI with cough     ED Discharge Orders    None       Note:  This document was prepared using Dragon voice recognition software and may include unintentional dictation errors.    [place], [place], PA-C 05/02/19 1348    Carrie Mew, MD 05/02/19 [phone]

## 2019-05-02 NOTE — Discharge Instructions (Addendum)
Follow-up with your primary care provider if any continued problems.  Return to the emergency department immediately if any shortness of breath or difficulty breathing.  Stay hydrated with lots of fluids.  Tylenol or ibuprofen as needed for fever, body aches or headache.  The results of your Covid test should be resulted tomorrow.  You will need to quarantine until those results have been received.  Should your test be positive you will need to quarantine for an additional 10 days.

## 2019-05-03 ENCOUNTER — Emergency Department: Payer: BC Managed Care – PPO

## 2019-05-03 ENCOUNTER — Emergency Department
Admission: EM | Admit: 2019-05-03 | Discharge: 2019-05-03 | Disposition: A | Discharge status: Home / Self Care | Payer: BC Managed Care – PPO | Attending: Emergency Medicine | Admitting: Emergency Medicine

## 2019-05-03 ENCOUNTER — Other Ambulatory Visit: Payer: Self-pay

## 2019-05-03 ENCOUNTER — Encounter: Payer: Self-pay | Admitting: Emergency Medicine

## 2019-05-03 DIAGNOSIS — Z7984 Long term (current) use of oral hypoglycemic drugs: Secondary | ICD-10-CM | POA: Diagnosis not present

## 2019-05-03 DIAGNOSIS — R109 Unspecified abdominal pain: Secondary | ICD-10-CM | POA: Diagnosis not present

## 2019-05-03 DIAGNOSIS — R519 Headache, unspecified: Secondary | ICD-10-CM | POA: Diagnosis not present

## 2019-05-03 DIAGNOSIS — Z79899 Other long term (current) drug therapy: Secondary | ICD-10-CM | POA: Insufficient documentation

## 2019-05-03 DIAGNOSIS — Z7982 Long term (current) use of aspirin: Secondary | ICD-10-CM | POA: Diagnosis not present

## 2019-05-03 DIAGNOSIS — K2100 Gastro-esophageal reflux disease with esophagitis, without bleeding: Secondary | ICD-10-CM | POA: Insufficient documentation

## 2019-05-03 DIAGNOSIS — E039 Hypothyroidism, unspecified: Secondary | ICD-10-CM | POA: Insufficient documentation

## 2019-05-03 DIAGNOSIS — I10 Essential (primary) hypertension: Secondary | ICD-10-CM | POA: Diagnosis not present

## 2019-05-03 DIAGNOSIS — R0789 Other chest pain: Secondary | ICD-10-CM | POA: Insufficient documentation

## 2019-05-03 DIAGNOSIS — R1011 Right upper quadrant pain: Secondary | ICD-10-CM | POA: Diagnosis not present

## 2019-05-03 DIAGNOSIS — E119 Type 2 diabetes mellitus without complications: Secondary | ICD-10-CM | POA: Insufficient documentation

## 2019-05-03 DIAGNOSIS — K219 Gastro-esophageal reflux disease without esophagitis: Secondary | ICD-10-CM | POA: Diagnosis not present

## 2019-05-03 DIAGNOSIS — R1013 Epigastric pain: Secondary | ICD-10-CM | POA: Diagnosis not present

## 2019-05-03 LAB — CBC
HCT: 42.6 % (ref 36.0–46.0)
Hemoglobin: 15.2 g/dL — ABNORMAL HIGH (ref 12.0–15.0)
MCH: 30.6 pg (ref 26.0–34.0)
MCHC: 35.7 g/dL (ref 30.0–36.0)
MCV: 85.9 fL (ref 80.0–100.0)
Platelets: 264 10*3/uL (ref 150–400)
RBC: 4.96 MIL/uL (ref 3.87–5.11)
RDW: 11.4 % — ABNORMAL LOW (ref 11.5–15.5)
WBC: 8.3 10*3/uL (ref 4.0–10.5)
nRBC: 0 % (ref 0.0–0.2)

## 2019-05-03 LAB — URINALYSIS, COMPLETE (UACMP) WITH MICROSCOPIC
Bacteria, UA: NONE SEEN
Bilirubin Urine: NEGATIVE
Glucose, UA: >=500 mg/dL — AB
Hgb urine dipstick: NEGATIVE
Ketones, ur: 5 mg/dL — AB
Nitrite: NEGATIVE
Protein, ur: NEGATIVE mg/dL
Specific Gravity, Urine: 1.023 (ref 1.005–1.030)
pH: 5 (ref 5.0–8.0)

## 2019-05-03 LAB — COMPREHENSIVE METABOLIC PANEL
ALT: 18 U/L (ref 0–44)
AST: 19 U/L (ref 15–41)
Albumin: 4.2 g/dL (ref 3.5–5.0)
Alkaline Phosphatase: 68 U/L (ref 38–126)
Anion gap: 10 (ref 5–15)
BUN: 19 mg/dL (ref 6–20)
CO2: 28 mmol/L (ref 22–32)
Calcium: 9.2 mg/dL (ref 8.9–10.3)
Chloride: 96 mmol/L — ABNORMAL LOW (ref 98–111)
Creatinine, Ser: 0.77 mg/dL (ref 0.44–1.00)
GFR calc Af Amer: >60 mL/min (ref >60)
GFR calc non Af Amer: >60 mL/min (ref >60)
Glucose, Bld: 149 mg/dL — ABNORMAL HIGH (ref 70–99)
Potassium: 3.4 mmol/L — ABNORMAL LOW (ref 3.5–5.1)
Sodium: 134 mmol/L — ABNORMAL LOW (ref 135–145)
Total Bilirubin: 1 mg/dL (ref 0.3–1.2)
Total Protein: 7.5 g/dL (ref 6.5–8.1)

## 2019-05-03 LAB — LIPASE, BLOOD: Lipase: 38 U/L (ref 11–51)

## 2019-05-03 LAB — TROPONIN I (HIGH SENSITIVITY)
Troponin I (High Sensitivity): <2 ng/L (ref <18)
Troponin I (High Sensitivity): <2 ng/L (ref <18)

## 2019-05-03 MED ORDER — PREDNISONE 20 MG PO TABS
60.0000 mg | ORAL_TABLET | Freq: Once | ORAL | Status: AC
  Administered 2019-05-03: 60 mg via ORAL
  Filled 2019-05-03: qty 3

## 2019-05-03 MED ORDER — ONDANSETRON 4 MG PO TBDP
4.0000 mg | ORAL_TABLET | Freq: Once | ORAL | Status: AC
  Administered 2019-05-03: 4 mg via ORAL
  Filled 2019-05-03: qty 1

## 2019-05-03 MED ORDER — ALUM & MAG HYDROXIDE-SIMETH 200-200-20 MG/5ML PO SUSP
30.0000 mL | Freq: Once | ORAL | Status: AC
  Administered 2019-05-03: 30 mL via ORAL
  Filled 2019-05-03: qty 30

## 2019-05-03 MED ORDER — FAMOTIDINE 20 MG PO TABS: 20.0000 mg | ORAL_TABLET | Freq: Two times a day (BID) | ORAL | 0 refills | Status: DC

## 2019-05-03 MED ORDER — KETOROLAC TROMETHAMINE 30 MG/ML IJ SOLN
15.0000 mg | Freq: Once | INTRAMUSCULAR | Status: AC
  Administered 2019-05-03: 15 mg via INTRAVENOUS
  Filled 2019-05-03: qty 1

## 2019-05-03 MED ORDER — ACETAMINOPHEN 500 MG PO TABS
ORAL_TABLET | ORAL | Status: AC
  Administered 2019-05-03: 1000 mg via ORAL
  Filled 2019-05-03: qty 2

## 2019-05-03 MED ORDER — BUTALBITAL-APAP-CAFFEINE 50-325-40 MG PO TABS: 1.0000 | ORAL_TABLET | Freq: Four times a day (QID) | ORAL | 0 refills | Status: DC | PRN

## 2019-05-03 MED ORDER — SODIUM CHLORIDE 0.9 % IV BOLUS
1000.0000 mL | Freq: Once | INTRAVENOUS | Status: AC
  Administered 2019-05-03: 1000 mL via INTRAVENOUS

## 2019-05-03 MED ORDER — FAMOTIDINE IN NACL 20-0.9 MG/50ML-% IV SOLN
20.0000 mg | Freq: Once | INTRAVENOUS | Status: AC
  Administered 2019-05-03: 20 mg via INTRAVENOUS
  Filled 2019-05-03 (×2): qty 50

## 2019-05-03 MED ORDER — OXYCODONE HCL 5 MG PO TABS
5.0000 mg | ORAL_TABLET | Freq: Once | ORAL | Status: AC
  Administered 2019-05-03: 5 mg via ORAL
  Filled 2019-05-03: qty 1

## 2019-05-03 MED ORDER — LIDOCAINE VISCOUS HCL 2 % MT SOLN
15.0000 mL | Freq: Once | OROMUCOSAL | Status: AC
  Administered 2019-05-03: 15 mL via ORAL
  Filled 2019-05-03: qty 15

## 2019-05-03 MED ORDER — ACETAMINOPHEN 500 MG PO TABS: 1000.0000 mg | ORAL_TABLET | Freq: Once | ORAL | Status: AC

## 2019-05-03 MED ORDER — PREDNISONE 10 MG (21) PO TBPK: ORAL_TABLET | ORAL | 0 refills | Status: DC

## 2019-05-03 NOTE — ED Notes (Signed)
Pt c/o HA 8/10. states that nausea is gone but stomach and head are still hurt. Tylenol and gi cocktail ordered and given.

## 2019-05-03 NOTE — Discharge Instructions (Addendum)
Please call and schedule follow-up appointment with your primary care provider. Please call and schedule follow-up appointment with cardiology as well. Take the medication as prescribed.  If you develop worsening chest pain or abdominal pain, please return to the emergency department if you are unable to schedule an appointment with primary care or the specialist.

## 2019-05-03 NOTE — ED Triage Notes (Signed)
Pt here yesterday for same sx but now having nausea.  C/o "chest pain" but when pt points is more epigastric.  Pain to RUQ on palpation. Decreased appetite.  No vomiting. Diarrhea today. No fevers.  VSS. covid neg yesterday.

## 2019-05-03 NOTE — ED Provider Notes (Signed)
[facility] Emergency Department Provider Note ____________________________________________   First MD Initiated Contact with Patient 05/03/19 1536     (approximate)  I have reviewed the triage vital signs and the nursing notes.   HISTORY  Chief Complaint Abdominal Pain  HPI Melanie Navarro is a 58 y.o. female presents to the emergency department for treatment and evaluation of chest/epigastric pain with decreased appetite.  No vomiting but she has had some diarrhea today.  She was evaluated here yesterday for 4 days of cough and chills.  COVID-19 testing was negative. Today, she states that she has had a persistent headache with increasing nausea and diarrhea. Right upper quadrant tenderness has developed as well with some pain under the right shoulder blade. She has not had an appetite, so doesn't know if food makes the pain and nausea worse. She denies fever today. No relief with ibuprofen.     Past Medical History:  Diagnosis Date  . Diabetes mellitus   . Hyperlipidemia   . Hypertension     Patient Active Problem List   Diagnosis Date Noted  . Chronic back pain 11/22/2018  . Hypothyroidism 05/18/2018  . Hyperlipidemia 12/09/2016  . Insomnia 12/31/2015  . Hypertension 10/27/2011  . Encounter for preventive health examination 10/27/2011  . Diabetes mellitus ([place]) 10/27/2011    Past Surgical History:  Procedure Laterality Date  . [place]    Prior to Admission medications   Medication Sig Start Date End Date Taking? Authorizing Provider  aspirin 81 MG tablet Take 81 mg by mouth daily.    [provider]  butalbital-acetaminophen-caffeine (FIORICET) 50-325-40 MG tablet Take 1 tablet by mouth every 6 (six) hours as needed for headache. 05/03/19 05/02/20  Jahmere Bramel, Johnette Abraham B, FNP  cyclobenzaprine (FLEXERIL) 5 MG tablet Take 1 tablet by mouth once daily as needed for muscle spasms. 11/22/18   Pleas Koch, NP    famotidine (PEPCID) 20 MG tablet Take 1 tablet (20 mg total) by mouth 2 (two) times daily. 05/03/19 06/02/19  Sherrie George B, FNP  glucose blood test strip Use as instructed 06/09/15   Rubbie Battiest, RN  Insulin Pen Needle (BD PEN NEEDLE MICRO U/F) 32G X 6 MM MISC USE WITH INSULIN EVERY NIGHT AT BEDTIME AS DIRECTED 03/06/19   Pleas Koch, NP  JARDIANCE 25 MG TABS tablet TAKE 1 TABLET BY MOUTH ONCE DAILY FOR DIABETES. 04/18/19   Pleas Koch, NP  Lancets [facility] ULTRASOFT) lancets Use as instructed 06/09/15   Rubbie Battiest, RN  LANTUS SOLOSTAR 100 UNIT/ML Solostar Pen INJECT 25 UNITS INTO THE SKIN AT BEDTIME. (NEED APPT**) 04/18/19   Pleas Koch, NP  levothyroxine (SYNTHROID) 75 MCG tablet TAKE 1 TABLET BY MOUTH EVERY MORNING ON AN EMPTY STOMACH. NO FOOD OR OTHER MEDICATIONS FOR 30 MINUTES. 04/19/19   Pleas Koch, NP  losartan-hydrochlorothiazide (HYZAAR) 100-25 MG tablet Take 1 tablet by mouth daily. For blood pressure. 05/18/18   Pleas Koch, NP  metFORMIN (GLUCOPHAGE-XR) 750 MG 24 hr tablet Take 1 tablet (750 mg total) by mouth daily with breakfast. For diabetes. 11/22/18   Pleas Koch, NP  metoprolol succinate (TOPROL-XL) 50 MG 24 hr tablet TAKE 1 TABLET (50 MG TOTAL) BY MOUTH DAILY. FOR BLOOD PRESSURE. 04/23/19   Pleas Koch, NP  predniSONE (STERAPRED UNI-PAK 21 TAB) 10 MG (21) TBPK tablet Take 6 tablets on the first day and decrease by 1 tablet each day until finished. 05/03/19  Torres Hardenbrook B, FNP  rosuvastatin (CRESTOR) 5 MG tablet Take 1 tablet (5 mg total) by mouth daily. For cholesterol. 05/21/18   Pleas Koch, NP    Allergies Glipizide, Penicillins, Sulfur, and Codeine  Family History  Problem Relation Age of Onset  . Stroke Mother   . Hypertension Mother   . Heart disease Mother   . Diabetes Mother   . Diabetes Father   . Cancer Father        Lung  . Diabetes Brother   . Colon cancer Neg Hx     Social History Social  History   Tobacco Use  . Smoking status: Never Smoker  . Smokeless tobacco: Never Used  Substance Use Topics  . Alcohol use: Yes    Alcohol/week: 1.0 standard drinks    Types: 1 Standard drinks or equivalent per week  . Drug use: No    Review of Systems  Constitutional: No fever/positive for chills Eyes: No visual changes. ENT: No sore throat. Cardiovascular: Denies chest pain. Respiratory: Denies shortness of breath. Gastrointestinal: Right upper quadrant pain. Positive for nausea, no vomiting.  Positive for diarrhea.  No constipation. Genitourinary: Negative for dysuria. Musculoskeletal: Positive for back pain. Skin: Negative for rash. Neurological: Positive for headaches, negative for focal weakness or numbness. ____________________________________________   PHYSICAL EXAM:  VITAL SIGNS: ED Triage Vitals  Enc Vitals Group     BP 05/03/19 1450 (!) 142/85     Pulse Rate 05/03/19 1450 75     Resp 05/03/19 1450 16     Temp 05/03/19 1450 98.3 F (36.8 C)     Temp Source 05/03/19 1450 Oral     SpO2 05/03/19 1450 100 %     Weight 05/03/19 1449 180 lb (81.6 kg)     Height 05/03/19 1449 5\' 5"  (1.651 m)     Head Circumference --      Peak Flow --      Pain Score 05/03/19 1449 10     Pain Loc --      Pain Edu? --      Excl. in [place]? --     Constitutional: Alert and oriented. Well appearing and in no acute distress. Eyes: Conjunctivae are normal. PERRL. EOMI. Head: Atraumatic. Nose: No congestion/rhinnorhea. Mouth/Throat: Mucous membranes are moist.  Oropharynx non-erythematous. Neck: No stridor.   Hematological/Lymphatic/Immunilogical: No cervical lymphadenopathy. Cardiovascular: Normal rate, regular rhythm. Grossly normal heart sounds.  Good peripheral circulation.  Positive for lower midsternal chest pain Respiratory: Normal respiratory effort.  No retractions. Lungs CTAB. Gastrointestinal: Soft and nontender. No distention. No abdominal bruits. No CVA  tenderness. Genitourinary:  Musculoskeletal: No lower extremity tenderness nor edema.  No joint effusions. Neurologic:  Normal speech and language. No gross focal neurologic deficits are appreciated. No gait instability. Skin:  Skin is warm, dry and intact. No rash noted. Psychiatric: Mood and affect are normal. Speech and behavior are normal.  ____________________________________________   LABS (all labs ordered are listed, but only abnormal results are displayed)  Labs Reviewed  CBC - Abnormal; Notable for the following components:      Result Value   Hemoglobin 15.2 (*)    RDW 11.4 (*)    All other components within normal limits  URINALYSIS, COMPLETE (UACMP) WITH MICROSCOPIC - Abnormal; Notable for the following components:   Color, Urine YELLOW (*)    APPearance CLEAR (*)    Glucose, UA >=500 (*)    Ketones, ur 5 (*)    Leukocytes,Ua TRACE (*)  All other components within normal limits  COMPREHENSIVE METABOLIC PANEL - Abnormal; Notable for the following components:   Sodium 134 (*)    Potassium 3.4 (*)    Chloride 96 (*)    Glucose, Bld 149 (*)    All other components within normal limits  LIPASE, BLOOD  TROPONIN I (HIGH SENSITIVITY)  TROPONIN I (HIGH SENSITIVITY)   ____________________________________________  EKG  ED ECG REPORT I, Makalia Bare, FNP-BC personally viewed and interpreted this ECG.   Date: 05/03/2019  EKG Time: 1444  Rate: 75  Rhythm: normal sinus rhythm  Axis: normal  Intervals:none  ST&T Change: no ST elevation  ____________________________________________  RADIOLOGY  ED MD interpretation:    [place] negative for acute findings per radiology.  Official radiology report(s): US Abdomen Limited RUQ  Result Date: 05/03/2019 CLINICAL DATA:  Abdominal pain for 4 days. EXAM: ULTRASOUND ABDOMEN LIMITED RIGHT UPPER QUADRANT COMPARISON:  None. FINDINGS: Gallbladder: No gallstones or wall thickening visualized. No sonographic Murphy sign noted by  sonographer. Common bile duct: Diameter: 0.6 cm Liver: No focal lesion identified. Within normal limits in parenchymal echogenicity. Portal vein is patent on color Doppler imaging with normal direction of blood flow towards the liver. Other: None. IMPRESSION: No sonographic finding to explain the patient's acute abdominal pain. Electronically Signed   By: Audie Pinto M.D.   On: 05/03/2019 16:51    ____________________________________________   PROCEDURES  Procedure(s) performed (including Critical Care):  Procedures  ____________________________________________   INITIAL IMPRESSION / ASSESSMENT AND PLAN     58 year old female presenting to the emergency department for treatment and evaluation of headache, right upper quadrant abdominal pain, epigastric pain, nausea and diarrhea.  She was tested for Covid about 5 days ago and received the results yesterday which were negative.  Plan will be to give her some IV fluids, Zofran, oxycodone and get a right upper quadrant ultrasound.  DIFFERENTIAL DIAGNOSIS  COVID-19, acute cholecystitis/cholelithiasis, viral syndrome  ED COURSE  While in the emergency department, the patient was given IV fluids and Zofran which controlled her nausea.  She continued to complain of headache and epigastric pain burning.  She was given Pepcid and GI cocktail with significant improvement.  Ultrasound of the right upper quadrant is negative for acute cholecystitis or cholelithiasis.  Patient states that she feels her headache is related to sinus congestion/pressure.  Prior to discharge she was given some Toradol and prednisone.  She will be treated with prednisone, Pepcid, and Fioricet on an outpatient basis and advised to call and schedule follow-up appointment with her primary care provider.  Patient requested a referral to see a cardiologist.  Although I do not feel that her current symptoms are cardiac related since she has had 2 - serial troponins as well as  a normal ECG, she is diabetic and does not have an established cardiologist.  She is to call and schedule an appointment on a nonemergent basis with Dr. Saunders Revel who is on-call tonight.  She was advised that if her abdominal pain or chest discomfort gets any worse, she is to return to the emergency department if she is unable to see primary care.  Melanie Navarro was evaluated in Emergency Department on 05/03/2019 for the symptoms described in the history of present illness. She was evaluated in the context of the global COVID-19 pandemic, which necessitated consideration that the patient might be at risk for infection with the SARS-CoV-2 virus that causes COVID-19. Institutional protocols and algorithms that pertain to the evaluation of patients  at risk for COVID-19 are in a state of rapid change based on information released by regulatory bodies including the CDC and federal and state organizations. These policies and algorithms were followed during the patient's care in the ED.  ____________________________________________   FINAL CLINICAL IMPRESSION(S) / ED DIAGNOSES  Final diagnoses:  Abdominal pain  Gastroesophageal reflux disease with esophagitis without hemorrhage  Acute chest wall pain  Sinus headache     ED Discharge Orders         Ordered    predniSONE (STERAPRED UNI-PAK 21 TAB) 10 MG (21) TBPK tablet     05/03/19 1759    famotidine (PEPCID) 20 MG tablet  2 times daily     05/03/19 1759    butalbital-acetaminophen-caffeine (FIORICET) 50-325-40 MG tablet  Every 6 hours PRN     05/03/19 1759           Note:  This document was prepared using Dragon voice recognition software and may include unintentional dictation errors.   Victorino Dike, FNP 05/03/19 1913    Carrie Mew, MD 05/04/19 [phone]

## 2019-05-03 NOTE — ED Notes (Signed)
Pt states that chest pain is 0/10.

## 2019-05-17 NOTE — Progress Notes (Signed)
New Outpatient Visit Date: 05/18/2019  Referring Provider: Carrie Mew, MD [facility] Emergency Department  Chief Complaint: Palpitations  HPI:  Melanie Navarro is a 59 y.o. female who is being seen today for the evaluation of chest and epigastric pain at the request of Dr. Joni Fears. She has a history of hypertension, hyperlipidemia, and type 2 diabetes mellitus. She presented to the [facility] ED on 12/30 and 05/03/2019 (initially for cough and chills, then subsequently for chest and epigastric pain).  EKG and serial troponins were negative.  RUQ ultrasound was also negative for hepatobiliary disease to explain her symptoms.  Cardiac etiology for her symptoms was felt to be unlikely, but the patient requested referral to cardiology for further evaluation.  She was also urged to follow-up with her PCP.  Today, Melanie Navarro reports that she is most concerned about palpitations.  She feels like her heart races intermittently over the last week and is most noticeable at night.  She wonders if recent course of prednisone may have contributed to this.  There are no associated symptoms with the palpitations, including chest pain, shortness of breath, and lightheadedness.  She believes that the recent chest and abdominal pain that brought her to the ED twice in late December were the result of a viral illness (she tested negative for COVID-19 twice).  She has not had any further chest pain, though she notes that she had experieinced occasional chest tightness over the last few months.  She also has mild exertional dyspnea when climbing stairs; this has been longstanding.  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  Melanie Navarro is concerned about discoloration along the dorsum of her left foot as well as some discoloration in both calves.  She notes some dependent edema but no orthopnea or PND.   She does not have leg pain (at rest or with ambulation.  She has not been able to exercise as much during the COVID-19 pandemic and has put on ~10 pounds during that time.  --------------------------------------------------------------------------------------------------  Cardiovascular History & Procedures: Cardiovascular Problems:  Chest and epigastric pain  Palpitations  Risk Factors:  Hypertension, hyperlipidemia, diabetes mellitus, family history, and obesity  Cath/PCI:  None  CV Surgery:  None  EP Procedures and Devices:  None  Non-Invasive Evaluation(s):  None available.  Recent CV Pertinent Labs: Lab Results  Component Value Date   CHOL 146 07/11/2018   HDL 58.20 07/11/2018   LDLCALC 64 07/11/2018   TRIG 115.0 07/11/2018   CHOLHDL 3 07/11/2018   K 3.4 (L) 05/03/2019   K 3.9 10/04/2011   MG 1.8 10/04/2011   BUN 19 05/03/2019   BUN 13 10/04/2011   CREATININE 0.77 05/03/2019   CREATININE 0.56 (L) 10/04/2011    --------------------------------------------------------------------------------------------------  Past Medical History:  Diagnosis Date  . Diabetes mellitus   . Hyperlipidemia   . Hypertension     Past Surgical History:  Procedure Laterality Date  . WISDOM TOOTH EXTRACTION  1984    Current Meds  Medication Sig  . aspirin 81 MG tablet Take 81 mg by mouth daily.  . cyclobenzaprine (FLEXERIL) 5 MG tablet Take 1 tablet by mouth once daily as needed for muscle spasms.  . famotidine (PEPCID) 20 MG tablet Take 1 tablet (20 mg total) by mouth 2 (two) times daily.  Marland Kitchen glucose blood test strip Use as instructed  . Insulin Pen  Needle (BD PEN NEEDLE MICRO U/F) 32G X 6 MM MISC USE WITH INSULIN EVERY NIGHT AT BEDTIME AS DIRECTED  . JARDIANCE 25 MG TABS tablet TAKE 1 TABLET BY MOUTH ONCE DAILY FOR DIABETES.  . Lancets (ONETOUCH ULTRASOFT) lancets Use as instructed  . LANTUS SOLOSTAR 100 UNIT/ML Solostar Pen INJECT 25 UNITS INTO THE SKIN AT BEDTIME.  (NEED APPT**)  . levothyroxine (SYNTHROID) 75 MCG tablet TAKE 1 TABLET BY MOUTH EVERY MORNING ON AN EMPTY STOMACH. NO FOOD OR OTHER MEDICATIONS FOR 30 MINUTES.  Marland Kitchen losartan-hydrochlorothiazide (HYZAAR) 100-25 MG tablet Take 1 tablet by mouth daily. For blood pressure.  . metFORMIN (GLUCOPHAGE-XR) 750 MG 24 hr tablet Take 1 tablet (750 mg total) by mouth daily with breakfast. For diabetes.  . metoprolol succinate (TOPROL-XL) 50 MG 24 hr tablet TAKE 1 TABLET (50 MG TOTAL) BY MOUTH DAILY. FOR BLOOD PRESSURE.  . rosuvastatin (CRESTOR) 5 MG tablet Take 1 tablet (5 mg total) by mouth daily. For cholesterol.    Allergies: Glipizide, Penicillins, Sulfur, and Codeine  Social History   Tobacco Use  . Smoking status: Never Smoker  . Smokeless tobacco: Never Used  Substance Use Topics  . Alcohol use: Not Currently    Comment: 1 drink per month  . Drug use: No    Family History  Problem Relation Age of Onset  . Stroke Mother   . Hypertension Mother   . Heart disease Mother   . Diabetes Mother   . Peripheral Artery Disease Mother   . Diabetes Father   . Cancer Father        Lung  . Diabetes Brother   . Heart attack Brother 87  . Colon cancer Neg Hx     Review of Systems: A 12-system review of systems was performed and was negative except as noted in the HPI.  --------------------------------------------------------------------------------------------------  Physical Exam: BP (!) 164/72 (BP Location: Left Arm, Patient Position: Sitting, Cuff Size: Normal)   Pulse 74   Ht 5' 5.5" (1.664 m)   Wt 180 lb 8 oz (81.9 kg)   SpO2 96%   BMI 29.58 kg/m   General:  NAD. HEENT: No conjunctival pallor or scleral icterus. Facemask in place. Neck: Supple without lymphadenopathy, thyromegaly, JVD, or HJR. No carotid bruit. Lungs: Normal work of breathing. Clear to auscultation bilaterally without wheezes or crackles. Heart: Regular rate and rhythm without murmurs, rubs, or gallops.  Non-displaced PMI. Abd: Bowel sounds present. Soft, NT/ND without hepatosplenomegaly Ext: No lower extremity edema. Radial, PT, and DP pulses are 2+ bilaterally Skin: Warm and dry without rash. Neuro: CNIII-XII intact. Strength and fine-touch sensation intact in upper and lower extremities bilaterally.  Varicose veins noted in both lower extremities. Psych: Normal mood and affect.  EKG:  NSR without abnormality.  Lab Results  Component Value Date   WBC 8.3 05/03/2019   HGB 15.2 (H) 05/03/2019   HCT 42.6 05/03/2019   MCV 85.9 05/03/2019   PLT 264 05/03/2019    Lab Results  Component Value Date   NA 134 (L) 05/03/2019   K 3.4 (L) 05/03/2019   CL 96 (L) 05/03/2019   CO2 28 05/03/2019   BUN 19 05/03/2019   CREATININE 0.77 05/03/2019   GLUCOSE 149 (H) 05/03/2019   ALT 18 05/03/2019    Lab Results  Component Value Date   CHOL 146 07/11/2018   HDL 58.20 07/11/2018   LDLCALC 64 07/11/2018   TRIG 115.0 07/11/2018   CHOLHDL 3 07/11/2018     --------------------------------------------------------------------------------------------------  ASSESSMENT AND PLAN: Chest pain: Chest and abdominal pain occurred in the setting of other URI symptoms and have since resolved.  Ms. Thornes also mentioned occasional tightness in her chest over the last few months, though it was not clearly exertional.  Exam and EKG are unrevealing today.  We have agreed to defer ischemia testing for now and will pursue recent palpitations first, as these are bothering Ms. Rydman the most at the present time.  Palpitations: No arrhythmia on EKG today.  Recent course of steroids may have precipitated/exacerbated this.  We will obtain a 14-day Ziopatch monitor for further evaluation.  Hypertension: BP is moderately elevated, though readings have typically been better over the last year.  In the setting of recent acute illness and prednisone use, we will defer medication changes today.  In encouraged Ms Parmeter to  limit her sodium intake.  If her BP remains elevated at follow-up escalation of her antihypertensive regimen will need to be considered.  Follow-up: Return to clinic in 6-8 weeks.  Nelva Bush, MD 05/19/2019 8:56 PM

## 2019-05-18 ENCOUNTER — Encounter: Payer: Self-pay | Admitting: Internal Medicine

## 2019-05-18 ENCOUNTER — Ambulatory Visit (INDEPENDENT_AMBULATORY_CARE_PROVIDER_SITE_OTHER): Payer: BC Managed Care – PPO | Admitting: Internal Medicine

## 2019-05-18 ENCOUNTER — Other Ambulatory Visit: Payer: Self-pay

## 2019-05-18 ENCOUNTER — Ambulatory Visit (INDEPENDENT_AMBULATORY_CARE_PROVIDER_SITE_OTHER): Payer: BC Managed Care – PPO

## 2019-05-18 VITALS — BP 164/72 | HR 74 | Ht 65.5 in | Wt 180.5 lb

## 2019-05-18 DIAGNOSIS — R002 Palpitations: Secondary | ICD-10-CM

## 2019-05-18 DIAGNOSIS — I10 Essential (primary) hypertension: Secondary | ICD-10-CM | POA: Diagnosis not present

## 2019-05-18 DIAGNOSIS — R079 Chest pain, unspecified: Secondary | ICD-10-CM | POA: Diagnosis not present

## 2019-05-18 NOTE — Patient Instructions (Signed)
Medication Instructions:  Your physician recommends that you continue on your current medications as directed. Please refer to the Current Medication list given to you today.  *If you need a refill on your cardiac medications before your next appointment, please call your pharmacy*  Lab Work: none If you have labs (blood work) drawn today and your tests are completely normal, you will receive your results only by: Marland Kitchen MyChart Message (if you have MyChart) OR . A paper copy in the mail If you have any lab test that is abnormal or we need to change your treatment, we will call you to review the results.  Testing/Procedures: Your physician has recommended that you wear an 14 DAY ZIO event monitor. Event monitors are medical devices that record the heart's electrical activity. Doctors most often [place] these monitors to diagnose arrhythmias. Arrhythmias are problems with the speed or rhythm of the heartbeat. The monitor is a small, portable device. You can wear one while you do your normal daily activities. This is usually used to diagnose what is causing palpitations/syncope (passing out). A Zio Patch Event Heart monitor will be applied to your chest today.  You will wear the patch for 14 days. After 24 hours, you may shower with the heart monitor on. If you feel any symptoms, you may press and release the button in the middle of the monitor.  Follow-Up: At [facility], you and your health needs are our priority.  As part of our continuing mission to provide you with exceptional heart care, we have created designated Provider Care Teams.  These Care Teams include your primary Cardiologist (physician) and Advanced Practice Providers (APPs -  Physician Assistants and Nurse Practitioners) who all work together to provide you with the care you need, when you need it.  Your next appointment:   6-8 week(s) with APP  The format for your next appointment:   In Person  Provider:    You may see one of  the following Advanced Practice Providers on your designated Care Team:    Murray Hodgkins, NP  Christell Faith, PA-C  Marrianne Mood, PA-C

## 2019-05-19 ENCOUNTER — Encounter: Payer: Self-pay | Admitting: Internal Medicine

## 2019-05-25 ENCOUNTER — Other Ambulatory Visit: Payer: Self-pay | Admitting: Primary Care

## 2019-05-25 DIAGNOSIS — I10 Essential (primary) hypertension: Secondary | ICD-10-CM

## 2019-05-25 NOTE — Telephone Encounter (Signed)
Patient is overdue for diabetes follow up, will you please schedule?

## 2019-05-25 NOTE — Telephone Encounter (Signed)
Last prescribed on 05/18/2018. Last appointment on 11/22/2018. No future appointment

## 2019-05-31 NOTE — Telephone Encounter (Signed)
Called to schedule. No answer and voicemail full.

## 2019-06-04 NOTE — Telephone Encounter (Signed)
Called to schedule. No answer and voicemail full.

## 2019-06-05 ENCOUNTER — Encounter: Payer: Self-pay | Admitting: Primary Care

## 2019-06-05 NOTE — Telephone Encounter (Signed)
Called to schedule. No answer and voicemail full. Sent letter.

## 2019-06-15 ENCOUNTER — Telehealth: Payer: Self-pay | Admitting: Internal Medicine

## 2019-06-15 DIAGNOSIS — I471 Supraventricular tachycardia: Secondary | ICD-10-CM

## 2019-06-15 NOTE — Addendum Note (Signed)
Addended by: Vanessa Ralphs on: 06/15/2019 05:00 PM   Modules accepted: Orders

## 2019-06-15 NOTE — Telephone Encounter (Signed)
Called patient back and discussed Dr Darnelle Bos results and recommendations.  Patient states she does not recall feeling lightheaded or having palpitations at night time. She gets up frequently to urinate but does not have any problems. Admits she does not sleep well and has wondered about sleep apnea. If she ever felt palpitations or slight lightheadedness, it was during the day. The metoprolol has helped that a lot. She is agreeable to Dr Darnelle Bos plan of care for sleep study and echo.  Advised her someone from scheduling would call next week to arrange. Referral placed and echo ordered.  Routing to scheduling.

## 2019-06-15 NOTE — Telephone Encounter (Signed)
Patient calling back in from Dr. Marisue Humble voicemail to go over results  Please advise

## 2019-06-15 NOTE — Telephone Encounter (Signed)
Left voicemail for patient contact the [place] office to discuss results of her event monitor.  This showed 2 episodes of transient Mobitz type I second-degree AV block progressing to complete heart block in the overnight/early hours, most likely reflecting elevated vagal tone as well as brief episodes of PSVT.  If she is asymptomatic, I would favor referring her for sleep study and echocardiogram, as well as continuing current medications.  If she had symptoms of presyncope/syncope or continued significant palpitations, she may benefit from EP evaluation as well.  Nelva Bush, MD [facility]

## 2019-06-18 NOTE — Telephone Encounter (Signed)
LMOV to schedule  Placed a hold on ECHO for 06/19/19 at [facility]

## 2019-07-03 ENCOUNTER — Ambulatory Visit: Payer: BC Managed Care – PPO | Admitting: Physician Assistant

## 2019-07-04 ENCOUNTER — Ambulatory Visit (INDEPENDENT_AMBULATORY_CARE_PROVIDER_SITE_OTHER): Payer: BC Managed Care – PPO

## 2019-07-04 ENCOUNTER — Other Ambulatory Visit: Payer: Self-pay

## 2019-07-04 DIAGNOSIS — I471 Supraventricular tachycardia: Secondary | ICD-10-CM | POA: Diagnosis not present

## 2019-07-05 ENCOUNTER — Encounter: Payer: Self-pay | Admitting: Nurse Practitioner

## 2019-07-05 ENCOUNTER — Ambulatory Visit (INDEPENDENT_AMBULATORY_CARE_PROVIDER_SITE_OTHER): Payer: BC Managed Care – PPO | Admitting: Nurse Practitioner

## 2019-07-05 VITALS — BP 174/90 | HR 80 | Ht 65.5 in | Wt 176.5 lb

## 2019-07-05 DIAGNOSIS — I10 Essential (primary) hypertension: Secondary | ICD-10-CM | POA: Diagnosis not present

## 2019-07-05 DIAGNOSIS — E782 Mixed hyperlipidemia: Secondary | ICD-10-CM | POA: Diagnosis not present

## 2019-07-05 DIAGNOSIS — R002 Palpitations: Secondary | ICD-10-CM

## 2019-07-05 DIAGNOSIS — R001 Bradycardia, unspecified: Secondary | ICD-10-CM

## 2019-07-05 MED ORDER — CARVEDILOL 12.5 MG PO TABS: 12.5000 mg | ORAL_TABLET | Freq: Two times a day (BID) | ORAL | 3 refills | Status: DC

## 2019-07-05 NOTE — Patient Instructions (Signed)
Medication Instructions:  1- STOP Toprol  2- ADD Coreg Take 1 tablet (12.5 mg total) by mouth 2 (two) times daily. *If you need a refill on your cardiac medications before your next appointment, please call your pharmacy*   Lab Work: None ordered If you have labs (blood work) drawn today and your tests are completely normal, you will receive your results only by: Marland Kitchen MyChart Message (if you have MyChart) OR . A paper copy in the mail If you have any lab test that is abnormal or we need to change your treatment, we will call you to review the results.   Testing/Procedures: None ordered    Follow-Up: At [facility], you and your health needs are our priority.  As part of our continuing mission to provide you with exceptional heart care, we have created designated Provider Care Teams.  These Care Teams include your primary Cardiologist (physician) and Advanced Practice Providers (APPs -  Physician Assistants and Nurse Practitioners) who all work together to provide you with the care you need, when you need it.  We recommend signing up for the patient portal called "MyChart".  Sign up information is provided on this After Visit Summary.  MyChart is used to connect with patients for Virtual Visits (Telemedicine).  Patients are able to view lab/test results, encounter notes, upcoming appointments, etc.  Non-urgent messages can be sent to your provider as well.   To learn more about what you can do with MyChart, go to [email or web address].    Your next appointment:   3 month(s)  The format for your next appointment:   In Person  Provider:    You may see Nelva Bush, MD or Murray Hodgkins, NP.

## 2019-07-05 NOTE — Progress Notes (Signed)
Office Visit    Patient Name: Melanie Navarro Date of Encounter: 07/05/2019  Primary Care Provider:  Pleas Koch, NP Primary Cardiologist:  Nelva Bush, MD  Chief Complaint    59 year old female with a history of hypertension, hyperlipidemia, diabetes, who presents for follow-up related to chest pain and palpitations.  Past Medical History    Past Medical History:  Diagnosis Date  . Diabetes mellitus   . History of echocardiogram    a. 07/2019 Echo: EF 60-65%. no rwma. Nl RV size/fxn. Mild Ao sclerosis w/o stenosis.   . Hyperlipidemia   . Hypertension   . Nocturnal Bradycardia    a. 06/2019 Zio: Mobitz I progressing to CHB w/ up to 3.6 sec pause (2 episodes)-->sleep study pending.  . Palpitations    a. 06/2019 Zio: Avg HR 72 (max/min 38/135). Rare PACs/PVCs. 9 atrial runs up to 9 beat/max 135 bpm. 2 brief episodes of Mobitz I progressing to CHB w/ longest pause of 3.7 secs - episodes occrred overnight/early morning hrs.   Past Surgical History:  Procedure Laterality Date  . WISDOM TOOTH EXTRACTION  1984    Allergies  Allergies  Allergen Reactions  . Glipizide     dizziness  . Penicillins     Rash   . Sulfur     Rash   . Codeine Swelling and Rash    History of Present Illness    59 year old female with a prior history of hypertension, hyperlipidemia, and diabetes.  In late December, she was evaluated in the [facility] ED with cough, chills, and subsequent development of chest and epigastric pain.  EKG and serial troponins were normal.  Right upper quadrant ultrasound was also negative for hepatobiliary disease to explain her symptoms.  She was discharged from the ED and referred to cardiology.  She followed up with Dr. Saunders Revel on January 15, at which time she reported no additional episodes of chest pain but was having periodic tachypalpitations after recent course of prednisone.  She subsequently underwent event monitoring which showed an average heart rate of 72 bpm.   Rare PACs and PVCs were noted.  She had 9 atrial runs up to 9 beats with a maximum 135 bpm.  On 2 occasions, during overnight/early morning hours, she was noted to have Mobitz 1 heart block with bradycardia and progression to complete heart block with a pause of up to 3.7 seconds.  As result of this, she was referred to pulmonology for sleep evaluation (pending), and she also underwent echocardiography which showed normal LV and RV function without any significant valvular abnormalities.  Since her last visit, she has continued to note occasional brief palpitations.  She mostly notices this when she is laying in bed and is quiet at nighttime.  She is reassured that there were no significant tachyarrhythmias found on her monitor and given brief and isolated nature of palpitations, she is not particularly bothered by them at this time.  We did discuss the finding of nocturnal bradycardia and brief episodes of complete heart block and she verbalized understanding of our rationale for sleep study.  She has not had any chest pain and denies dyspnea, PND, orthopnea, dizziness, syncope, edema, or early satiety.  Her blood pressure is elevated today and she says it is because she just found out today her brother has been diagnosed with a significant illness.  She is also had a stressful day at work.  That said, pressures are typically in the 140s and above at home as  well.  She did not feel that metoprolol is working for her blood pressure anymore.  Home Medications    Prior to Admission medications   Medication Sig Start Date End Date Taking? Authorizing Provider  aspirin 81 MG tablet Take 81 mg by mouth daily.   Yes [provider]  cyclobenzaprine (FLEXERIL) 5 MG tablet Take 1 tablet by mouth once daily as needed for muscle spasms. 11/22/18  Yes Pleas Koch, NP  famotidine (PEPCID) 20 MG tablet Take 1 tablet (20 mg total) by mouth 2 (two) times daily. 05/03/19 07/04/28 Yes Triplett, Cari B, FNP    glucose blood test strip Use as instructed 06/09/15  Yes Doss, Velora Heckler, RN  Insulin Pen Needle (BD PEN NEEDLE MICRO U/F) 32G X 6 MM MISC USE WITH INSULIN EVERY NIGHT AT BEDTIME AS DIRECTED 03/06/19  Yes Pleas Koch, NP  JARDIANCE 25 MG TABS tablet TAKE 1 TABLET BY MOUTH ONCE DAILY FOR DIABETES. 04/18/19  Yes Pleas Koch, NP  Lancets [facility] ULTRASOFT) lancets Use as instructed 06/09/15  Yes Doss, Velora Heckler, RN  LANTUS SOLOSTAR 100 UNIT/ML Solostar Pen INJECT 25 UNITS INTO THE SKIN AT BEDTIME. (NEED APPT**) 04/18/19  Yes Pleas Koch, NP  levothyroxine (SYNTHROID) 75 MCG tablet TAKE 1 TABLET BY MOUTH EVERY MORNING ON AN EMPTY STOMACH. NO FOOD OR OTHER MEDICATIONS FOR 30 MINUTES. 04/19/19  Yes Pleas Koch, NP  losartan-hydrochlorothiazide (HYZAAR) 100-25 MG tablet TAKE 1 TABLET BY MOUTH DAILY FOR BLOOD PRESSURE 05/25/19  Yes Pleas Koch, NP  metFORMIN (GLUCOPHAGE-XR) 750 MG 24 hr tablet Take 1 tablet (750 mg total) by mouth daily with breakfast. For diabetes. 11/22/18  Yes Pleas Koch, NP  metoprolol succinate (TOPROL-XL) 50 MG 24 hr tablet TAKE 1 TABLET (50 MG TOTAL) BY MOUTH DAILY. FOR BLOOD PRESSURE. 04/23/19  Yes Pleas Koch, NP  rosuvastatin (CRESTOR) 5 MG tablet Take 1 tablet (5 mg total) by mouth daily. For cholesterol. 05/21/18  Yes Pleas Koch, NP    Review of Systems    Occasional palpitations as outlined above.  These have not been particularly bothersome or sustained.  She denies chest pain, dyspnea, PND, orthopnea, dizziness, syncope, edema, or early satiety.  All other systems reviewed and are otherwise negative except as noted above.  Physical Exam    VS:  BP (!) 174/90 (BP Location: Left Arm, Patient Position: Sitting, Cuff Size: Normal)   Pulse 80   Ht 5' 5.5" (1.664 m)   Wt 176 lb 8 oz (80.1 kg)   SpO2 98%   BMI 28.92 kg/m  , BMI Body mass index is 28.92 kg/m. GEN: Well nourished, well developed, in no acute  distress. HEENT: normal. Neck: Supple, no JVD, carotid bruits, or masses. Cardiac: RRR, no murmurs, rubs, or gallops. No clubbing, cyanosis, edema.  Radials/PT 2+ and equal bilaterally.  Respiratory:  Respirations regular and unlabored, clear to auscultation bilaterally. GI: Soft, nontender, nondistended, BS + x 4. MS: no deformity or atrophy. Skin: warm and dry, no rash. Neuro:  Strength and sensation are intact. Psych: Normal affect.  Accessory Clinical Findings    ECG personally reviewed by me today -regular sinus rhythm, 80, nonspecific T changes - no acute changes.  Lab Results  Component Value Date   WBC 8.3 05/03/2019   HGB 15.2 (H) 05/03/2019   HCT 42.6 05/03/2019   MCV 85.9 05/03/2019   PLT 264 05/03/2019   Lab Results  Component Value Date   CREATININE 0.77  05/03/2019   BUN 19 05/03/2019   NA 134 (L) 05/03/2019   K 3.4 (L) 05/03/2019   CL 96 (L) 05/03/2019   CO2 28 05/03/2019   Lab Results  Component Value Date   ALT 18 05/03/2019   AST 19 05/03/2019   ALKPHOS 68 05/03/2019   BILITOT 1.0 05/03/2019   Lab Results  Component Value Date   CHOL 146 07/11/2018   HDL 58.20 07/11/2018   LDLCALC 64 07/11/2018   TRIG 115.0 07/11/2018   CHOLHDL 3 07/11/2018    Lab Results  Component Value Date   HGBA1C 9.7 (A) 11/22/2018    Assessment & Plan    1.  Palpitations: These have been stable on beta-blocker therapy.  Recent monitoring did not show any prolonged/sustained tachyarrhythmias though brief atrial runs were noted.  Echo showed normal LV and RV function without any significant valvular abnormalities.  As her blood pressure is elevated today and she expressed that she does not feel as though metoprolol has been doing a good job with her blood pressure recently, we agreed to switch her to carvedilol 12.5 mg twice daily.  I advised that she should continue to monitor palpitations given change in beta-blocker therapy as we may need to either titrate or switch back  to metoprolol or an alternate beta-blocker if carvedilol is not successful in helping to manage palpitations.  2.  Essential hypertension: Pressures have been trending in the 140s at home and is higher today at 174/90 in the setting of some work and family related stress.  We discussed options for management and agreed to change her metoprolol to carvedilol 12.5 mg twice daily.  She will follow blood pressure at home and notify us if she continues to trend over 130, at which point we can consider further titration of beta-blocker therapy versus the addition of amlodipine.  3.  Nocturnal bradycardia: Patient was noted to have 2 episodes of Mobitz 1 with progression to complete heart block and up to 3.7-second pauses while wearing a Zi0 monitoring recently.  These occurred during overnight/early morning hours and presumably she was sleeping.  She does have a history of snoring.  Echo showed normal LV and RV function without valvular abnormalities.  She has been referred to pulmonology for sleep study.  4.  Hyperlipidemia: LDL 64 in March of last year on statin therapy.  5.  Diabetes mellitus: A1c was elevated at 9.7 last July.  She is on Jardiance and Lantus.  Per primary care.  6.  Disposition: Patient to follow blood pressure at home and will contact us if she continues to trend greater than 130.  She will follow up with pulmonology for sleep study.  Follow-up in clinic in 3 months or sooner if necessary.  Murray Hodgkins, NP 07/05/2019, 6:06 PM

## 2019-07-18 ENCOUNTER — Encounter: Payer: Self-pay | Admitting: Acute Care

## 2019-07-18 ENCOUNTER — Other Ambulatory Visit: Payer: Self-pay

## 2019-07-18 ENCOUNTER — Ambulatory Visit (INDEPENDENT_AMBULATORY_CARE_PROVIDER_SITE_OTHER): Payer: BC Managed Care – PPO | Admitting: Acute Care

## 2019-07-18 VITALS — BP 122/68 | HR 59 | Temp 98.4°F | Ht 65.5 in | Wt 177.0 lb

## 2019-07-18 DIAGNOSIS — R001 Bradycardia, unspecified: Secondary | ICD-10-CM | POA: Diagnosis not present

## 2019-07-18 NOTE — Progress Notes (Signed)
History of Present Illness Melanie Navarro is a 59 y.o. female with HTN, Hyperlipidemia, DM  and nocturnal bradycardia. She is here for sleep consult.   07/18/2019  Pt. Presents for sleep consult. She has been referred for evaluation as she has history of  chest pain after Christmas 2020 requiring 2 ED visits. Because of this, she had a cards work up with heart monitoring  which showed some nocturnal  bradycardia. 06/2019 Zio: Mobitz I progressing to CHB w/ up to 3.6 sec pause (2 episodes). She has been referred to pulmonary  for evaluation of sleep apnea.  She states she does not sleep well in general. She goes to bed between 10:30 and 11. It usually takes about 1 hour or more  to fall asleep.  She usually gets up at least once but sometimes twice  a night to use the bathroom.  She awakens at 7 AM each day She states she does not usually wake up with a headache.  She does have daytime fatigue but not to the point of dosing off.  Her husband has told her she does snore. She states her weight has increased  5 pounds in the last 2 years. She has on occasion used Tylenol pm to help with sleep, but does not use anything on a regular basis.. 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 She does not experience  screen time close to bedtime as she is on a computer all day at work.  She drinks one glass of wine every few weeks.  She denies any other pulmonary issues other than seasonal allergies. .  Test Results: 04/22/2019 CXR Heart and mediastinal contours are within normal limits. No focal opacities or effusions. No acute bony abnormality. No active disease  Epworth score of 6   CBC Latest Ref Rng & Units 05/03/2019 05/02/2019 10/04/2011  WBC 4.0 - 10.5 K/uL 8.3 6.8 8.0  Hemoglobin 12.0 - 15.0 g/dL 15.2(H) 14.6 14.1  Hematocrit 36.0 - 46.0 % 42.6 41.7 40.6  Platelets 150 - 400 K/uL 264 247 264    BMP Latest Ref Rng & Units 05/03/2019 05/02/2019 05/18/2018  Glucose 70 - 99  mg/dL 149(H) 238(H) 197(H)  BUN 6 - 20 mg/dL 19 19 19   Creatinine 0.44 - 1.00 mg/dL 0.77 0.63 0.78  Sodium 135 - 145 mmol/L 134(L) 134(L) 138  Potassium 3.5 - 5.1 mmol/L 3.4(L) 3.6 4.3  Chloride 98 - 111 mmol/L 96(L) 97(L) 102  CO2 22 - 32 mmol/L 28 24 29   Calcium 8.9 - 10.3 mg/dL 9.2 8.9 9.5    BNP No results found for: BNP  ProBNP No results found for: PROBNP  PFT No results found for: FEV1PRE, FEV1POST, FVCPRE, FVCPOST, TLC, DLCOUNC, PREFEV1FVCRT, PSTFEV1FVCRT  ECHOCARDIOGRAM COMPLETE  Result Date: 07/04/2019    ECHOCARDIOGRAM REPORT   Patient Name:   Melanie Navarro Date of Exam: 07/04/2019 Medical Rec #:  [id number]     Height:       65.5 in Accession #:    [id number]    Weight:       180.5 lb Date of Birth:  10-21-1960     BSA:          1.905 m Patient Age:    43 years      BP:           140/78 mmHg Patient Gender: F             HR:  69 bpm. Exam Location:  [facility] Procedure: 2D Echo, Cardiac Doppler and Color Doppler Indications:    I47.1, PSVT  History:        Patient has no prior history of Echocardiogram examinations.                 Signs/Symptoms:Chest Pain and palpitations; Risk                 Factors:Hypertension, Dyslipidemia, Non-Smoker and Diabetes.  Sonographer:    Hester Mates BS, RVT, RDCS Referring Phys: [place]  1. Left ventricular ejection fraction, by estimation, is 60 to 65%. The left ventricle has normal function. The left ventricle has no regional wall motion abnormalities. Left ventricular diastolic parameters were normal.  2. Right ventricular systolic function is normal. The right ventricular size is normal. There is normal pulmonary artery systolic pressure.  3. The mitral valve is normal in structure and function. No evidence of mitral valve regurgitation.  4. The aortic valve is tricuspid. Aortic valve regurgitation is not visualized. Mild aortic valve sclerosis is present, with no evidence of aortic valve stenosis.  5. The  inferior vena cava is normal in size with greater than 50% respiratory variability, suggesting right atrial pressure of 3 mmHg. FINDINGS  Left Ventricle: Left ventricular ejection fraction, by estimation, is 60 to 65%. The left ventricle has normal function. The left ventricle has no regional wall motion abnormalities. The left ventricular internal cavity size was normal in size. There is  no left ventricular hypertrophy. Left ventricular diastolic parameters were normal. Right Ventricle: The right ventricular size is normal. No increase in right ventricular wall thickness. Right ventricular systolic function is normal. There is normal pulmonary artery systolic pressure. The tricuspid regurgitant velocity is 2.39 m/s, and  with an assumed right atrial pressure of 3 mmHg, the estimated right ventricular systolic pressure is 0000000 mmHg. Left Atrium: Left atrial size was normal in size. Right Atrium: Right atrial size was normal in size. Pericardium: There is no evidence of pericardial effusion. Mitral Valve: The mitral valve is normal in structure and function. No evidence of mitral valve regurgitation. Tricuspid Valve: The tricuspid valve is normal in structure. Tricuspid valve regurgitation is trivial. Aortic Valve: The aortic valve is tricuspid. Aortic valve regurgitation is not visualized. Mild aortic valve sclerosis is present, with no evidence of aortic valve stenosis. Pulmonic Valve: The pulmonic valve was not well visualized. Pulmonic valve regurgitation is not visualized. Aorta: The aortic root is normal in size and structure. Venous: The inferior vena cava is normal in size with greater than 50% respiratory variability, suggesting right atrial pressure of 3 mmHg. IAS/Shunts: No atrial level shunt detected by color flow Doppler.  LEFT VENTRICLE PLAX 2D LVIDd:         3.30 cm     Diastology LV PW:         0.99 cm     LV e' lateral:   10.70 cm/s LV IVS:        1.36 cm     LV E/e' lateral: 9.3 LVOT diam:     1.80  cm     LV e' medial:    7.31 cm/s LV SV:         51          LV E/e' medial:  13.6 LV SV Index:   27 LVOT Area:     2.54 cm  LV Volumes (MOD) LV vol d, MOD A2C: 42.0 ml LV vol d,  MOD A4C: 60.0 ml LV vol s, MOD A2C: 9.0 ml LV vol s, MOD A4C: 23.0 ml LV SV MOD A2C:     33.0 ml LV SV MOD A4C:     60.0 ml LV SV MOD BP:      36.0 ml LEFT ATRIUM             Index LA diam:        2.80 cm 1.47 cm/m LA Vol (A2C):           16.30 ml/m LA Vol (A4C):           12.60 ml/m LA Biplane Vol:         14.70 ml/m  AORTIC VALVE             PULMONIC VALVE LVOT Vmax:   101.00 cm/s PV Vmax:       1.09 m/s LVOT Vmean:  75.800 cm/s PV Peak grad:  4.8 mmHg LVOT VTI:    0.202 m  AORTA Ao Root diam: 3.10 cm Ao Asc diam:  3.20 cm MITRAL VALVE                TRICUSPID VALVE MV Area (PHT): 3.53 cm     TR Peak grad:   22.8 mmHg MV Decel Time: 215 msec     TR Vmax:        239.00 cm/s MV E velocity: 99.30 cm/s MV A velocity: 107.00 cm/s  SHUNTS MV E/A ratio:  0.93         Systemic VTI:  0.20 m                             Systemic Diam: 1.80 cm Kate Sable MD Electronically signed by Kate Sable MD Signature Date/Time: 07/04/2019/3:15:23 PM    Final      Past medical hx Past Medical History:  Diagnosis Date  . Diabetes mellitus   . History of echocardiogram    a. 07/2019 Echo: EF 60-65%. no rwma. Nl RV size/fxn. Mild Ao sclerosis w/o stenosis.   . Hyperlipidemia   . Hypertension   . Nocturnal Bradycardia    a. 06/2019 Zio: Mobitz I progressing to CHB w/ up to 3.6 sec pause (2 episodes)-->sleep study pending.  . Palpitations    a. 06/2019 Zio: Avg HR 72 (max/min 38/135). Rare PACs/PVCs. 9 atrial runs up to 9 beat/max 135 bpm. 2 brief episodes of Mobitz I progressing to CHB w/ longest pause of 3.7 secs - episodes occrred overnight/early morning hrs.     Social History   Tobacco Use  . Smoking status: Never Smoker  . Smokeless tobacco: Never Used  Substance Use Topics  . Alcohol use: Not Currently    Comment: 1 drink  per month  . Drug use: No    Ms.Nembhard reports that she has never smoked. She has never used smokeless tobacco. She reports previous alcohol use. She reports that she does not use drugs.  Tobacco Cessation: Never smoker   Past surgical hx, Family hx, Social hx all reviewed.  Current Outpatient Medications on File Prior to Visit  Medication Sig  . aspirin 81 MG tablet Take 81 mg by mouth daily.  . carvedilol (COREG) 12.5 MG tablet Take 1 tablet (12.5 mg total) by mouth 2 (two) times daily.  . cyclobenzaprine (FLEXERIL) 5 MG tablet Take 1 tablet by mouth once daily as needed for muscle spasms.  . famotidine (PEPCID) 20 MG tablet  Take 1 tablet (20 mg total) by mouth 2 (two) times daily.  Marland Kitchen glucose blood test strip Use as instructed  . Insulin Pen Needle (BD PEN NEEDLE MICRO U/F) 32G X 6 MM MISC USE WITH INSULIN EVERY NIGHT AT BEDTIME AS DIRECTED  . JARDIANCE 25 MG TABS tablet TAKE 1 TABLET BY MOUTH ONCE DAILY FOR DIABETES.  . Lancets (ONETOUCH ULTRASOFT) lancets Use as instructed  . LANTUS SOLOSTAR 100 UNIT/ML Solostar Pen INJECT 25 UNITS INTO THE SKIN AT BEDTIME. (NEED APPT**)  . levothyroxine (SYNTHROID) 75 MCG tablet TAKE 1 TABLET BY MOUTH EVERY MORNING ON AN EMPTY STOMACH. NO FOOD OR OTHER MEDICATIONS FOR 30 MINUTES.  Marland Kitchen losartan-hydrochlorothiazide (HYZAAR) 100-25 MG tablet TAKE 1 TABLET BY MOUTH DAILY FOR BLOOD PRESSURE  . metFORMIN (GLUCOPHAGE-XR) 750 MG 24 hr tablet Take 1 tablet (750 mg total) by mouth daily with breakfast. For diabetes.  . rosuvastatin (CRESTOR) 5 MG tablet Take 1 tablet (5 mg total) by mouth daily. For cholesterol.   No current facility-administered medications on file prior to visit.     Allergies  Allergen Reactions  . Glipizide     dizziness  . Penicillins     Rash   . Sulfur     Rash   . Codeine Swelling and Rash    Review Of Systems:  Constitutional:   No  weight loss, night sweats,  Fevers, chills, + occasional  fatigue, or   lassitude.  HEENT:   No headaches,  Difficulty swallowing,  Tooth/dental problems, or  Sore throat,                No sneezing, itching, ear ache, nasal congestion, post nasal drip,   CV:  No chest pain,  Orthopnea, PND, swelling in lower extremities, anasarca, dizziness, palpitations, syncope.   GI  No heartburn, indigestion, abdominal pain, nausea, vomiting, diarrhea, change in bowel habits, loss of appetite, bloody stools.   Resp: No shortness of breath with exertion or at rest.  No excess mucus, no productive cough,  No non-productive cough,  No coughing up of blood.  No change in color of mucus.  No wheezing.  No chest wall deformity, + snoring  Skin: no rash or lesions.  GU: no dysuria, change in color of urine, no urgency or frequency.  No flank pain, no hematuria   MS:  No joint pain or swelling.  No decreased range of motion.  No back pain.  Psych:  No change in mood or affect. No depression or anxiety.  No memory loss.   Vital Signs BP 122/68 (BP Location: Left Arm, Cuff Size: Normal)   Pulse (!) 59   Temp 98.4 F (36.9 C) (Temporal)   Ht 5' 5.5" (1.664 m)   Wt 177 lb (80.3 kg)   SpO2 99% Comment: on RA  BMI 29.01 kg/m    Physical Exam:  General- No distress,  A&Ox3, pleasant ENT: No sinus tenderness, TM clear, pale nasal mucosa, no oral exudate,no post nasal drip, no LAN Cardiac: S1, S2, regular rate and rhythm, no murmur Chest: No wheeze/ rales/ dullness; no accessory muscle use, no nasal flaring, no sternal retractions Abd.: Soft Non-tender, ND, BS + Body mass index is 29.01 kg/m. Ext: No clubbing cyanosis, edema Neuro:  normal strength, MAE x 4, A&O x 3 Skin: No rashes, No lesions,  warm and dry Psych: normal mood and behavior   Assessment/Plan  Suspected OSA Daytime sleepiness Nocturnal Bradycardia Plan We will schedule you for a home sleep study  today. Someone will call you to pick up the machine If we are unable to do a home sleep study we will  schedule one for the sleep lab. Continue to work on weight loss, as the link between excess weight  and sleep apnea is well established.   Remember to establish a good bedtime routine, and work on sleep hygiene.  Limit daytime naps , avoid stimulants such as caffeine and nicotine close to bedtime, exercise daily to promote sleep quality, avoid heavy , spicy, fried , or rich foods before bed. Ensure adequate exposure to natural light during the day,establish a relaxing bedtime routine with a pleasant sleep environment ( Bedroom between 60 and 67 degrees, turn off bright lights , TV or device screens screens , consider black out curtains or white noise machines) Do not drive if sleepy. Follow up once sleep study results have been read with Judson Roch NP.  ( Please schedule for tele visit in 4-5 weeks)   Please contact office for sooner follow up if symptoms do not improve or worsen or seek emergency care   This appointment was 30 min long with over 50% of the time in direct face-to-face patient care, assessment, plan of care, and follow-up.    Magdalen Spatz, NP 07/18/2019  10:00 AM

## 2019-07-18 NOTE — Patient Instructions (Addendum)
It is nice to meet you today. We will schedule you for a home sleep study today. Someone will call you to pick up the machine If we are unable to do a home sleep study we will schedule one for the sleep lab. Continue to work on weight loss, as the link between excess weight  and sleep apnea is well established.   Remember to establish a good bedtime routine, and work on sleep hygiene.  Limit daytime naps , avoid stimulants such as caffeine and nicotine close to bedtime, exercise daily to promote sleep quality, avoid heavy , spicy, fried , or rich foods before bed. Ensure adequate exposure to natural light during the day,establish a relaxing bedtime routine with a pleasant sleep environment ( Bedroom between 60 and 67 degrees, turn off bright lights , TV or device screens screens , consider black out curtains or white noise machines) Do not drive if sleepy. Follow up once sleep study results have been read with Judson Roch NP.  ( Please schedule for tele visit in 4-5 weeks)   Please contact office for sooner follow up if symptoms do not improve or worsen or seek emergency care

## 2019-07-24 ENCOUNTER — Other Ambulatory Visit: Payer: Self-pay | Admitting: Primary Care

## 2019-07-24 ENCOUNTER — Encounter: Payer: Self-pay | Admitting: Acute Care

## 2019-07-24 DIAGNOSIS — E119 Type 2 diabetes mellitus without complications: Secondary | ICD-10-CM

## 2019-07-24 DIAGNOSIS — I10 Essential (primary) hypertension: Secondary | ICD-10-CM

## 2019-08-03 ENCOUNTER — Other Ambulatory Visit: Payer: Self-pay | Admitting: Primary Care

## 2019-08-03 DIAGNOSIS — E039 Hypothyroidism, unspecified: Secondary | ICD-10-CM

## 2019-08-08 ENCOUNTER — Ambulatory Visit
Admission: RE | Admit: 2019-08-08 | Discharge: 2019-08-08 | Disposition: A | Discharge status: Home / Self Care | Payer: BC Managed Care – PPO | Source: Ambulatory Visit | Attending: Chiropractor | Admitting: Chiropractor

## 2019-08-08 ENCOUNTER — Other Ambulatory Visit: Payer: Self-pay | Admitting: Chiropractor

## 2019-08-08 DIAGNOSIS — W19XXXA Unspecified fall, initial encounter: Secondary | ICD-10-CM | POA: Insufficient documentation

## 2019-08-08 DIAGNOSIS — M9903 Segmental and somatic dysfunction of lumbar region: Secondary | ICD-10-CM | POA: Diagnosis not present

## 2019-08-08 DIAGNOSIS — S79911A Unspecified injury of right hip, initial encounter: Secondary | ICD-10-CM | POA: Diagnosis not present

## 2019-08-08 DIAGNOSIS — S79912A Unspecified injury of left hip, initial encounter: Secondary | ICD-10-CM | POA: Diagnosis not present

## 2019-08-08 DIAGNOSIS — M25552 Pain in left hip: Secondary | ICD-10-CM | POA: Diagnosis not present

## 2019-08-08 DIAGNOSIS — M62830 Muscle spasm of back: Secondary | ICD-10-CM | POA: Diagnosis not present

## 2019-08-08 DIAGNOSIS — M545 Low back pain: Secondary | ICD-10-CM | POA: Diagnosis not present

## 2019-08-08 DIAGNOSIS — M5432 Sciatica, left side: Secondary | ICD-10-CM | POA: Diagnosis not present

## 2019-08-15 DIAGNOSIS — M62830 Muscle spasm of back: Secondary | ICD-10-CM | POA: Diagnosis not present

## 2019-08-15 DIAGNOSIS — M5432 Sciatica, left side: Secondary | ICD-10-CM | POA: Diagnosis not present

## 2019-08-15 DIAGNOSIS — M9903 Segmental and somatic dysfunction of lumbar region: Secondary | ICD-10-CM | POA: Diagnosis not present

## 2019-08-15 DIAGNOSIS — M545 Low back pain: Secondary | ICD-10-CM | POA: Diagnosis not present

## 2019-08-20 ENCOUNTER — Encounter: Payer: Self-pay | Admitting: Family Medicine

## 2019-08-20 ENCOUNTER — Telehealth (INDEPENDENT_AMBULATORY_CARE_PROVIDER_SITE_OTHER): Payer: BC Managed Care – PPO | Admitting: Family Medicine

## 2019-08-20 DIAGNOSIS — U071 COVID-19: Secondary | ICD-10-CM | POA: Diagnosis not present

## 2019-08-20 DIAGNOSIS — J310 Chronic rhinitis: Secondary | ICD-10-CM

## 2019-08-20 DIAGNOSIS — Z20822 Contact with and (suspected) exposure to covid-19: Secondary | ICD-10-CM | POA: Diagnosis not present

## 2019-08-20 MED ORDER — PREDNISONE 10 MG PO TABS: ORAL_TABLET | ORAL | 0 refills | Status: DC

## 2019-08-20 NOTE — Progress Notes (Addendum)
Interactive audio and video telecommunications were attempted between this provider and patient, however failed, due to patient having technical difficulties OR patient did not have access to video capability.  We continued and completed visit with audio only.   Virtual Visit via Telephone Note  I connected with patient on 08/20/19  at 2:32 PM  by telephone and verified that I am speaking with the correct person using two identifiers.  Location of patient: home  Location of MD: [place] Name of referring provider (if blank then none associated): Names per persons and role in encounter:  MD: Earlyne Iba, Patient: name listed above.    I discussed the limitations, risks, security and privacy concerns of performing an evaluation and management service by telephone and the availability of in person appointments. I also discussed with the patient that there may be a patient responsible charge related to this service. The patient expressed understanding and agreed to proceed.  CC: URI sx.    History of Present Illness: sx started about 10 days ago.  Had been out in the yard, initially presumed to be allergies.  No fevers.  Persistent sx in the meantime.  Fatigue, her back feels a little tight.  No ear pain but fullness noted.  Stuffy noted.  She has some lightheadedness that improves laying down.  No loss or taste or smell.  She has a coworker who is positive but they don't work closely together.  She has been masking in the meantime.  She can work from home.  Appetite is good.  She has used flonase some this week with some relief.    No wheeze.  Some cough prev but that is better in the meantime.    [facility]    Sugar has been ~112 recently.  She clearly had improvement with low carb diet over the last few months.  She has tolerated prednisone prev w/o sugar elevation.    Observations/Objective: nad Speech wnl  Assessment and Plan:   Likely  allergic rhinitis.  Okay for pred taper with flonase given recent improvement in sugar with routine cautions d/w pt.  She'll call about covid testing though I expect it to be negative.  Supportive care o/w.   D/w pt about cutting losartan/HCTZ in half for a few days and rechecking BP when possible.   Work note done.  She agrees.  Okay for outpatient f/u.  Update [place] as needed.    Follow Up Instructions: see above.    I discussed the assessment and treatment plan with the patient. The patient was provided an opportunity to ask questions and all were answered. The patient agreed with the plan and demonstrated an understanding of the instructions.   The patient was advised to call back or seek an in-person evaluation if the symptoms worsen or if the condition fails to improve as anticipated.  I provided 20 minutes of non-face-to-face time during this encounter.  Elsie Stain, MD ================== Addendum from patient, added to this note on 08/30/19   Trenisha, Runck to Me     08/28/19 9:37 AM Dr. Damita Dunnings - please have my chart updated to show that after our virtual appt on Monday, April 19th, I went to the walk in and tested positive for Covid 19.  Since you had already prescribed prednisone, no additional meds were prescribed.  Thank you. I am back to work today, April 27th

## 2019-08-21 NOTE — Progress Notes (Deleted)
Virtual Visit via Telephone Note  I connected with Melanie Navarro on 08/21/19 at  9:00 AM EDT by telephone and verified that I am speaking with the correct person using two identifiers.  Location: Patient: *** Provider: ***   I discussed the limitations, risks, security and privacy concerns of performing an evaluation and management service by telephone and the availability of in person appointments. I also discussed with the patient that there may be a patient responsible charge related to this service. The patient expressed understanding and agreed to proceed.   History of Present Illness:    Observations/Objective:   Assessment and Plan:   Follow Up Instructions:    I discussed the assessment and treatment plan with the patient. The patient was provided an opportunity to ask questions and all were answered. The patient agreed with the plan and demonstrated an understanding of the instructions.   The patient was advised to call back or seek an in-person evaluation if the symptoms worsen or if the condition fails to improve as anticipated.  I provided *** minutes of non-face-to-face time during this encounter.   Magdalen Spatz, NP

## 2019-08-22 ENCOUNTER — Telehealth: Payer: Self-pay | Admitting: Acute Care

## 2019-08-22 ENCOUNTER — Encounter: Payer: Self-pay | Admitting: Acute Care

## 2019-08-22 ENCOUNTER — Ambulatory Visit: Payer: BC Managed Care – PPO | Admitting: Acute Care

## 2019-08-22 DIAGNOSIS — J310 Chronic rhinitis: Secondary | ICD-10-CM | POA: Insufficient documentation

## 2019-08-22 NOTE — Assessment & Plan Note (Signed)
Likely allergic rhinitis.  Okay for pred taper with flonase given recent improvement in sugar with routine cautions d/w pt.  She'll call about covid testing though I expect it to be negative.  Supportive care o/w.   D/w pt about cutting losartan/HCTZ in half for a few days and rechecking BP when possible.   Work note done.  She agrees.  Okay for outpatient f/u.  Update [place] as needed.

## 2019-08-22 NOTE — Telephone Encounter (Signed)
I have called pt x 3 and left vm's to schedule HST.  She called me on 4/12 and said she would call me back by end of last week to schedule and she did not call.  I called her 4/19 and left her another vm to call me to schedule.

## 2019-08-22 NOTE — Telephone Encounter (Signed)
I called the patient for her 9 am tele visit. There was no answer. I left a message asking the patient to call the office so that we can get her Home Sleep Study scheduled . The home sleep study was ordered 07/18/2019, but has never been scheduled. This appointment was scheduled to review the home sleep study results.There are no results to review. I have left the contact information for the patient to call the office. Triage/ Judeen Hammans can we see why this home sleep study has not been scheduled as it was ordered 3/17? Can we call the patient and arrange? Thanks all

## 2019-08-23 NOTE — Telephone Encounter (Signed)
I mailed pt a letter yesterday notifying her we have been unable to contact her and to call us to schedule her home sleep study.

## 2019-08-23 NOTE — Telephone Encounter (Signed)
Let me know if you ever get through to her. Thanks so much [employer]

## 2019-08-28 ENCOUNTER — Telehealth: Payer: Self-pay | Admitting: Acute Care

## 2019-08-28 ENCOUNTER — Encounter: Payer: Self-pay | Admitting: Family Medicine

## 2019-08-28 NOTE — Telephone Encounter (Signed)
ATC patient and didn't get an answer and her mailbox is full. I will route this to the PCC's to follow up with patient in regards to getting her HST scheduled.  PCC's please advise. It does not look like she has anything scheduled and no notes are in the referral.

## 2019-08-28 NOTE — Telephone Encounter (Signed)
FYI. Not sure if you want to addend or not.

## 2019-08-28 NOTE — Telephone Encounter (Signed)
I have made multiple attempts to contact pt for hst.  She was scheduled at one point and then canceled and was going to call me back to schedule and did not call.  I tried her last on 4/21 and mailbox was full.  At that time I mailed her a letter to notify her I had been trying to contact her and to call our office if she wants to schedule hst.  Notes are in note on order.  Just tried pt again and mailbox is full.

## 2019-08-30 ENCOUNTER — Other Ambulatory Visit: Payer: Self-pay | Admitting: Primary Care

## 2019-08-30 DIAGNOSIS — E119 Type 2 diabetes mellitus without complications: Secondary | ICD-10-CM

## 2019-08-31 NOTE — Telephone Encounter (Signed)
I called pt & was able to leave vm asking for call back regarding sleep study.

## 2019-09-04 NOTE — Telephone Encounter (Signed)
Called pt again today and left another vm about setting up hst.  In message I told pt to call & let us know when she is ready to schedule.  Will close out message due to this was 3rd attempt again to reach pt.

## 2019-09-17 DIAGNOSIS — R262 Difficulty in walking, not elsewhere classified: Secondary | ICD-10-CM | POA: Diagnosis not present

## 2019-09-17 DIAGNOSIS — M25552 Pain in left hip: Secondary | ICD-10-CM | POA: Diagnosis not present

## 2019-09-25 DIAGNOSIS — R262 Difficulty in walking, not elsewhere classified: Secondary | ICD-10-CM | POA: Diagnosis not present

## 2019-09-25 DIAGNOSIS — M25552 Pain in left hip: Secondary | ICD-10-CM | POA: Diagnosis not present

## 2019-09-27 DIAGNOSIS — M25552 Pain in left hip: Secondary | ICD-10-CM | POA: Diagnosis not present

## 2019-09-27 DIAGNOSIS — R262 Difficulty in walking, not elsewhere classified: Secondary | ICD-10-CM | POA: Diagnosis not present

## 2019-10-08 DIAGNOSIS — M25552 Pain in left hip: Secondary | ICD-10-CM | POA: Diagnosis not present

## 2019-10-08 DIAGNOSIS — R262 Difficulty in walking, not elsewhere classified: Secondary | ICD-10-CM | POA: Diagnosis not present

## 2019-11-15 DIAGNOSIS — M25552 Pain in left hip: Secondary | ICD-10-CM | POA: Diagnosis not present

## 2019-11-15 DIAGNOSIS — M5416 Radiculopathy, lumbar region: Secondary | ICD-10-CM | POA: Diagnosis not present

## 2019-11-29 ENCOUNTER — Other Ambulatory Visit: Payer: Self-pay | Admitting: Primary Care

## 2019-11-29 DIAGNOSIS — I10 Essential (primary) hypertension: Secondary | ICD-10-CM

## 2019-11-29 DIAGNOSIS — E039 Hypothyroidism, unspecified: Secondary | ICD-10-CM

## 2019-12-12 ENCOUNTER — Other Ambulatory Visit: Payer: Self-pay | Admitting: Primary Care

## 2019-12-12 DIAGNOSIS — E119 Type 2 diabetes mellitus without complications: Secondary | ICD-10-CM

## 2019-12-14 ENCOUNTER — Other Ambulatory Visit: Payer: Self-pay | Admitting: Primary Care

## 2019-12-14 DIAGNOSIS — E119 Type 2 diabetes mellitus without complications: Secondary | ICD-10-CM

## 2019-12-14 DIAGNOSIS — Z794 Long term (current) use of insulin: Secondary | ICD-10-CM

## 2019-12-17 NOTE — Telephone Encounter (Signed)
Please call patient. Have not been seen since 11/2018. Will need appointment before any refills

## 2019-12-18 NOTE — Telephone Encounter (Signed)
Pt called to check status on this. She did schedule an appointment via [place]. She states she only has 1 night's worth left.

## 2019-12-19 NOTE — Telephone Encounter (Signed)
Refill sent to pharmacy. Please notify patient that I have been out of the office since August 6th, we will see her on the 25th as scheduled.

## 2019-12-20 NOTE — Telephone Encounter (Signed)
Responded to patient through MyChart message

## 2019-12-24 ENCOUNTER — Ambulatory Visit: Payer: BC Managed Care – PPO | Attending: Internal Medicine

## 2019-12-24 DIAGNOSIS — Z23 Encounter for immunization: Secondary | ICD-10-CM

## 2019-12-24 NOTE — Progress Notes (Signed)
   Covid-19 Vaccination Clinic  Name:  THALYA FOUCHE    MRN: [id number] DOB: 1960/09/10  12/24/2019  Ms. Hoelzer was observed post Covid-19 immunization for 15 minutes without incident. She was provided with Vaccine Information Sheet and instruction to access the V-Safe system.   Ms. Novicki was instructed to call 911 with any severe reactions post vaccine: Marland Kitchen Difficulty breathing  . Swelling of face and throat  . A fast heartbeat  . A bad rash all over body  . Dizziness and weakness   Immunizations Administered    Name Date Dose VIS Date Route   Pfizer COVID-19 Vaccine 12/24/2019 10:21 AM 0.3 mL 06/27/2018 Intramuscular   Manufacturer: [employer]   Lot: [id number]   [place]: [address]-2

## 2019-12-26 ENCOUNTER — Ambulatory Visit: Payer: BC Managed Care – PPO | Admitting: Primary Care

## 2020-01-02 ENCOUNTER — Ambulatory Visit: Payer: BC Managed Care – PPO | Admitting: Primary Care

## 2020-01-06 ENCOUNTER — Other Ambulatory Visit: Payer: Self-pay | Admitting: Primary Care

## 2020-01-06 DIAGNOSIS — E039 Hypothyroidism, unspecified: Secondary | ICD-10-CM

## 2020-01-06 DIAGNOSIS — I10 Essential (primary) hypertension: Secondary | ICD-10-CM

## 2020-01-21 ENCOUNTER — Ambulatory Visit: Payer: BC Managed Care – PPO | Attending: Internal Medicine

## 2020-01-21 ENCOUNTER — Ambulatory Visit: Payer: BC Managed Care – PPO

## 2020-01-21 DIAGNOSIS — Z23 Encounter for immunization: Secondary | ICD-10-CM

## 2020-01-21 NOTE — Progress Notes (Signed)
° °  Covid-19 Vaccination Clinic  Name:  Melanie Navarro    MRN: [id number] DOB: 09/01/1960  01/21/2020  [name] was observed post Covid-19 immunization for 15 minutes without incident. She was provided with Vaccine Information Sheet and instruction to access the V-Safe system.   [name] was instructed to call 911 with any severe reactions post vaccine:  Difficulty breathing   Swelling of face and throat   A fast heartbeat   A bad rash all over body   Dizziness and weakness   Immunizations Administered    Name Date Dose VIS Date Route   Pfizer COVID-19 Vaccine 01/21/2020 10:13 AM 0.3 mL 06/27/2018 Intramuscular   Manufacturer: [place]   Lot: [id number]   [place]: [address]-4

## 2020-01-22 ENCOUNTER — Other Ambulatory Visit: Payer: Self-pay | Admitting: Primary Care

## 2020-01-22 DIAGNOSIS — I10 Essential (primary) hypertension: Secondary | ICD-10-CM

## 2020-02-05 ENCOUNTER — Other Ambulatory Visit: Payer: Self-pay | Admitting: Primary Care

## 2020-02-05 DIAGNOSIS — Z794 Long term (current) use of insulin: Secondary | ICD-10-CM

## 2020-02-05 DIAGNOSIS — E119 Type 2 diabetes mellitus without complications: Secondary | ICD-10-CM

## 2020-02-06 ENCOUNTER — Other Ambulatory Visit: Payer: Self-pay | Admitting: Primary Care

## 2020-02-06 DIAGNOSIS — E119 Type 2 diabetes mellitus without complications: Secondary | ICD-10-CM

## 2020-02-07 DIAGNOSIS — E119 Type 2 diabetes mellitus without complications: Secondary | ICD-10-CM

## 2020-02-07 NOTE — Telephone Encounter (Signed)
Left message to return call to our office.  

## 2020-02-09 NOTE — Telephone Encounter (Signed)
Marc Morgans regarding referral. Thanks!

## 2020-02-12 MED ORDER — BD PEN NEEDLE MICRO U/F 32G X 6 MM MISC: 0 refills | Status: DC

## 2020-02-12 NOTE — Telephone Encounter (Signed)
I tried to call pt to verify received pt message from [place] this morning but v/m is full. Pt not at work until [employer]. I tried again to reach pt by cell but no answer and v/m still full.

## 2020-02-12 NOTE — Telephone Encounter (Signed)
[place] Night - Client Nonclinical Telephone Record AccessNurse Client [place] Night - Client Client Site [place] Physician Alma Friendly - NP Contact Type Call Who Is Calling Patient / Member / Family / Caregiver Caller Name [place] Phone Number [phone] Patient Name Melanie Navarro Patient DOB 17-May-1960 Call Type Message Only Information Provided Reason for Call Request for General Office Information Initial Comment Caller states she is out of her insulin pen. The doctor knows and she want to make sure its called [facility] the [facility]. Disp. Time Disposition Final User 02/11/2020 8:29:22 PM General Information Provided Yes Ronnald Ramp, [place] Call Closed By: Eliane Decree Transaction Date/Time: 02/11/2020 8:25:50 PM (ET)

## 2020-02-19 ENCOUNTER — Other Ambulatory Visit: Payer: Self-pay | Admitting: Primary Care

## 2020-02-19 DIAGNOSIS — I10 Essential (primary) hypertension: Secondary | ICD-10-CM

## 2020-02-19 DIAGNOSIS — E039 Hypothyroidism, unspecified: Secondary | ICD-10-CM

## 2020-03-09 ENCOUNTER — Other Ambulatory Visit: Payer: Self-pay | Admitting: Primary Care

## 2020-03-09 DIAGNOSIS — I10 Essential (primary) hypertension: Secondary | ICD-10-CM

## 2020-03-10 DIAGNOSIS — E119 Type 2 diabetes mellitus without complications: Secondary | ICD-10-CM

## 2020-03-17 ENCOUNTER — Ambulatory Visit (INDEPENDENT_AMBULATORY_CARE_PROVIDER_SITE_OTHER): Payer: BC Managed Care – PPO | Admitting: Primary Care

## 2020-03-17 ENCOUNTER — Other Ambulatory Visit: Payer: Self-pay

## 2020-03-17 VITALS — BP 124/76 | HR 78 | Temp 98.2°F | Resp 15 | Ht 65.5 in | Wt 184.0 lb

## 2020-03-17 DIAGNOSIS — Z23 Encounter for immunization: Secondary | ICD-10-CM | POA: Diagnosis not present

## 2020-03-17 DIAGNOSIS — M25642 Stiffness of left hand, not elsewhere classified: Secondary | ICD-10-CM

## 2020-03-17 DIAGNOSIS — K219 Gastro-esophageal reflux disease without esophagitis: Secondary | ICD-10-CM | POA: Diagnosis not present

## 2020-03-17 DIAGNOSIS — E785 Hyperlipidemia, unspecified: Secondary | ICD-10-CM

## 2020-03-17 DIAGNOSIS — M25641 Stiffness of right hand, not elsewhere classified: Secondary | ICD-10-CM

## 2020-03-17 DIAGNOSIS — E119 Type 2 diabetes mellitus without complications: Secondary | ICD-10-CM | POA: Diagnosis not present

## 2020-03-17 DIAGNOSIS — E039 Hypothyroidism, unspecified: Secondary | ICD-10-CM

## 2020-03-17 DIAGNOSIS — I10 Essential (primary) hypertension: Secondary | ICD-10-CM

## 2020-03-17 DIAGNOSIS — R002 Palpitations: Secondary | ICD-10-CM

## 2020-03-17 LAB — HEMOGLOBIN A1C: Hgb A1c MFr Bld: 10.6 % — ABNORMAL HIGH (ref 4.6–6.5)

## 2020-03-17 LAB — LIPID PANEL
Cholesterol: 224 mg/dL — ABNORMAL HIGH (ref 0–200)
HDL: 70.4 mg/dL (ref >39.00)
LDL Cholesterol: 125 mg/dL — ABNORMAL HIGH (ref 0–99)
NonHDL: 153.18
Total CHOL/HDL Ratio: 3
Triglycerides: 142 mg/dL (ref 0.0–149.0)
VLDL: 28.4 mg/dL (ref 0.0–40.0)

## 2020-03-17 LAB — CBC
HCT: 43 % (ref 36.0–46.0)
Hemoglobin: 14.6 g/dL (ref 12.0–15.0)
MCHC: 34 g/dL (ref 30.0–36.0)
MCV: 93.5 fl (ref 78.0–100.0)
Platelets: 238 10*3/uL (ref 150.0–400.0)
RBC: 4.6 Mil/uL (ref 3.87–5.11)
RDW: 12.4 % (ref 11.5–15.5)
WBC: 5.4 10*3/uL (ref 4.0–10.5)

## 2020-03-17 LAB — COMPREHENSIVE METABOLIC PANEL
ALT: 19 U/L (ref 0–35)
AST: 18 U/L (ref 0–37)
Albumin: 4.3 g/dL (ref 3.5–5.2)
Alkaline Phosphatase: 76 U/L (ref 39–117)
BUN: 21 mg/dL (ref 6–23)
CO2: 31 mEq/L (ref 19–32)
Calcium: 9.3 mg/dL (ref 8.4–10.5)
Chloride: 101 mEq/L (ref 96–112)
Creatinine, Ser: 0.86 mg/dL (ref 0.40–1.20)
GFR: 73.4 mL/min (ref >60.00)
Glucose, Bld: 147 mg/dL — ABNORMAL HIGH (ref 70–99)
Potassium: 3.7 mEq/L (ref 3.5–5.1)
Sodium: 139 mEq/L (ref 135–145)
Total Bilirubin: 0.7 mg/dL (ref 0.2–1.2)
Total Protein: 6.9 g/dL (ref 6.0–8.3)

## 2020-03-17 LAB — C-REACTIVE PROTEIN: CRP: <1 mg/dL (ref 0.5–20.0)

## 2020-03-17 LAB — TSH: TSH: 2.41 u[IU]/mL (ref 0.35–4.50)

## 2020-03-17 LAB — SEDIMENTATION RATE: Sed Rate: 6 mm/hr (ref 0–30)

## 2020-03-17 MED ORDER — LOSARTAN POTASSIUM-HCTZ 100-25 MG PO TABS: 1.0000 | ORAL_TABLET | Freq: Every day | ORAL | 3 refills | Status: DC

## 2020-03-17 MED ORDER — METFORMIN HCL ER 500 MG PO TB24: 500.0000 mg | ORAL_TABLET | Freq: Every day | ORAL | 3 refills | Status: DC

## 2020-03-17 MED ORDER — FAMOTIDINE 20 MG PO TABS: 20.0000 mg | ORAL_TABLET | Freq: Every day | ORAL | 0 refills | Status: DC

## 2020-03-17 NOTE — Assessment & Plan Note (Signed)
Improved with carvedilol 12.5 mg twice daily. Continue same.

## 2020-03-17 NOTE — Assessment & Plan Note (Signed)
Evidence of Heberden's nodes on exam. Also family history of what seems like rheumatoid arthritis. Labs pending today for rheumatoid panel to rule out.  Consider rheumatology regardless given disfigurement noted.

## 2020-03-17 NOTE — Assessment & Plan Note (Addendum)
She is taking levothyroxine 75 mcg correctly. Repeat TSH pending. We will send in refills once TSH results.

## 2020-03-17 NOTE — Progress Notes (Signed)
Subjective:    Patient ID: Melanie Navarro, female    DOB: Feb 20, 1961, 59 y.o.   MRN: [id number]  HPI  This visit occurred during the SARS-CoV-2 public health emergency.  Safety protocols were in place, including screening questions prior to the visit, additional usage of staff PPE, and extensive cleaning of exam room while observing appropriate contact time as indicated for disinfecting solutions.   Melanie Navarro is a 59 year old female with a history of hypertension, type 2 diabetes, hypothyroidism, insomnia, hyperlipidemia who presents today for follow up.  1) Essential Hypertension: Currently managed on carvedilol 12.5 mg BID, losartan-HCTZ 100-25 mg. She is currently following with cardiology for palpitations and nocturnal bradycardia.  She underwent Holter monitor evaluation which revealed Mobitz 1 with progression to complete heart block up to a 3.7-second pause during sleep.  She was referred for sleep apnea testing given these findings.  Due to scheduling difficulties and caring for her brother with cancer she has been unable to undergo the sleep study.  She is working to get this set up.   She plans on following back up with cardiology in early 2022.  Overall, she is doing well on carvedilol and has not noticed any palpitations recently.  BP Readings from Last 3 Encounters:  03/17/20 124/76  07/18/19 122/68  07/05/19 (!) 174/90   2) Type 2 Diabetes:   Current medications include: Jardiance 25 mg, Metformin XR 750 mg daily, Lantus 25 units HS. She stopped Metformin XR about one year ago due to GI upset.  She has not been in for diabetes follow-up in over 1 year.  She is checking her blood glucose irregularly which are ranging:  AM fasting 90's to 160's.   Last A1C: 9.7 in July 2020 Last Eye Exam: Due Last Foot Exam: Due Pneumonia Vaccination: Completed in 2016 ACE/ARB: Losartan Statin: atorvastatin   Wt Readings from Last 3 Encounters:  03/17/20 184 lb (83.5 kg)  08/20/19 177  lb (80.3 kg)  07/18/19 177 lb (80.3 kg)   3) Hypothyroidism: Currently managed on levothyroxine 75 mcg and is taking every morning on an empty stomach with water only . No food or other medications for 30-45 minutes. No heartburn medication for four hours or later.  She is due for repeat thyroid testing.  4) Joint Stiffness: Chronic to bilateral hands, now with "cysts" and bumps to fingers and palms of hands. She has a family history of "cripling arthritis" in her paternal grandmother. Chronic for the last several months. Denies pain, mostly experiences stiffness and has noticed disfigurement and bumps to her fingers. 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  She has never been screened for rheumatoid arthritis.  Review of Systems  Eyes: Negative for visual disturbance.  Respiratory: Negative for shortness of breath.   Cardiovascular: Negative for chest pain and palpitations.  Musculoskeletal: Positive for joint swelling.  Skin: Negative for color change.  Neurological: Negative for dizziness, numbness and headaches.       Past Medical History:  Diagnosis Date  . Diabetes mellitus   . History of echocardiogram    a. 07/2019 Echo: EF 60-65%. no rwma. Nl RV size/fxn. Mild Ao sclerosis w/o stenosis.   . Hyperlipidemia   . Hypertension   . Nocturnal Bradycardia    a. 06/2019 [facility]: Mobitz I progressing to CHB w/ up to 3.6 sec pause (2 episodes)-->sleep study pending.  . Palpitations    a. 06/2019 [facility]: Avg HR 72 (max/min 38/135). Rare PACs/PVCs. 9 atrial runs up  to 9 beat/max 135 bpm. 2 brief episodes of Mobitz I progressing to CHB w/ longest pause of 3.7 secs - episodes occrred overnight/early morning hrs.     Social History   Socioeconomic History  . Marital status: Married    Spouse name: Not on file  . Number of children: Not on file  . Years of education: Not on file  . Highest education level: Not on file  Occupational History  . Not on file  Tobacco Use  . Smoking status: Never Smoker   . Smokeless tobacco: Never Used  Vaping Use  . Vaping Use: Never used  Substance and Sexual Activity  . Alcohol use: Not Currently    Comment: 1 drink per month  . Drug use: No  . Sexual activity: Not on file  Other Topics Concern  . Not on file  Social History Narrative   Married.   2 children. 1 grandchild.   Works in Science writer.   Enjoys gardening, flowers, spending time with family.   Social Determinants of Health   Financial Resource Strain:   . Difficulty of Paying Living Expenses: Not on file  Food Insecurity:   . Worried About Charity fundraiser in the Last Year: Not on file  . Ran Out of Food in the Last Year: Not on file  Transportation Needs:   . Lack of Transportation (Medical): Not on file  . Lack of Transportation (Non-Medical): Not on file  Physical Activity:   . Days of Exercise per Week: Not on file  . Minutes of Exercise per Session: Not on file  Stress:   . Feeling of Stress : Not on file  Social Connections:   . Frequency of Communication with Friends and Family: Not on file  . Frequency of Social Gatherings with Friends and Family: Not on file  . Attends Religious Services: Not on file  . Active Member of Clubs or Organizations: Not on file  . Attends Archivist Meetings: Not on file  . Marital Status: Not on file  Intimate Partner Violence:   . Fear of Current or Ex-Partner: Not on file  . Emotionally Abused: Not on file  . Physically Abused: Not on file  . Sexually Abused: Not on file    Past Surgical History:  Procedure Laterality Date  . WISDOM TOOTH EXTRACTION  1984    Family History  Problem Relation Age of Onset  . Stroke Mother   . Hypertension Mother   . Heart disease Mother   . Diabetes Mother   . Peripheral Artery Disease Mother   . Diabetes Father   . Cancer Father        Lung  . Diabetes Brother   . Heart attack Brother 79  . Colon cancer Neg Hx     Allergies  Allergen Reactions  . Glipizide     dizziness   . Penicillins     Rash   . Sulfur     Rash   . Codeine Swelling and Rash    Current Outpatient Medications on File Prior to Visit  Medication Sig Dispense Refill  . aspirin 81 MG tablet Take 81 mg by mouth daily.    . carvedilol (COREG) 12.5 MG tablet Take 1 tablet (12.5 mg total) by mouth 2 (two) times daily. 180 tablet 3  . cyclobenzaprine (FLEXERIL) 5 MG tablet Take 1 tablet by mouth once daily as needed for muscle spasms. 30 tablet 0  . glucose blood test strip Use as instructed  30 each 1  . Insulin Pen Needle (BD PEN NEEDLE MICRO U/F) 32G X 6 MM MISC USE WITH INSULIN EVERY NIGHT AT BEDTIME AS DIRECTED 100 each 0  . JARDIANCE 25 MG TABS tablet TAKE 1 TABLET BY MOUTH ONCE DAILY FOR DIABETES. 90 tablet 0  . Lancets (ONETOUCH ULTRASOFT) lancets Use as instructed 30 each 1  . LANTUS SOLOSTAR 100 UNIT/ML Solostar Pen INJECT 20 UNITS INTO THE SKIN AT BEDTIME. NEED FOLLOW UP APPOINTMENT 15 mL 0  . levothyroxine (SYNTHROID) 75 MCG tablet TAKE 1 TABLET BY MOUTH EVERY MORNING ON AN EMPTY STOMACH. NO FOOD OR OTHER MEDICATIONS FOR 30 MINUTE 15 tablet 0  . predniSONE (DELTASONE) 10 MG tablet Take 2 a day for 5 days, then 1 a day for 5 days, with food. Don't take with aleve/ibuprofen. 15 tablet 0  . rosuvastatin (CRESTOR) 5 MG tablet Take 1 tablet (5 mg total) by mouth daily. For cholesterol. 90 tablet 3   No current facility-administered medications on file prior to visit.    BP 124/76   Pulse 78   Temp 98.2 F (36.8 C) (Temporal)   Resp 15   Ht 5' 5.5" (1.664 m)   Wt 184 lb (83.5 kg)   SpO2 98%   BMI 30.15 kg/m    Objective:   Physical Exam Cardiovascular:     Rate and Rhythm: Normal rate and regular rhythm.  Pulmonary:     Effort: Pulmonary effort is normal.     Breath sounds: Normal breath sounds.  Musculoskeletal:     Cervical back: Neck supple.     Comments: Heberden's nodes noted to bilateral hands. Left third digit with abnormal alignment.   Skin:    General: Skin is  warm and dry.     Findings: No erythema.  Psychiatric:        Mood and Affect: Mood normal.            Assessment & Plan:

## 2020-03-17 NOTE — Assessment & Plan Note (Signed)
Well-controlled in the office today.  Continue carvedilol 12.5 mg twice daily, losartan-hydrochlorothiazide 100-25 mg once daily.  CMP pending.

## 2020-03-17 NOTE — Assessment & Plan Note (Signed)
Unclear if she is taking Crestor, refill history shows that she would be out since January 2021.  Repeat lipid panel pending. Will inquire if she is actually taking Crestor. We will need to reinitiate if she has been without.

## 2020-03-17 NOTE — Patient Instructions (Addendum)
Stop by the lab prior to leaving today. I will notify you of your results once received.   Be sure to take your levothyroxine (thyroid medication) every morning on an empty stomach with water only. No food or other medications for 30 minutes. No heartburn medication, iron pills, calcium, vitamin D, or magnesium pills within four hours of taking levothyroxine.   We've reduced your dose of Metformin XR to 500 mg. Take this once daily with food for diabetes.   It was a pleasure to see you today!   Diabetes Mellitus and Nutrition, Adult When you have diabetes (diabetes mellitus), it is very important to have healthy eating habits because your blood sugar (glucose) levels are greatly affected by what you eat and drink. Eating healthy foods in the appropriate amounts, at about the same times every day, can help you:  Control your blood glucose.  Lower your risk of heart disease.  Improve your blood pressure.  Reach or maintain a healthy weight. Every person with diabetes is different, and each person has different needs for a meal plan. Your health care provider may recommend that you work with a diet and nutrition specialist (dietitian) to make a meal plan that is best for you. Your meal plan may vary depending on factors such as:  The calories you need.  The medicines you take.  Your weight.  Your blood glucose, blood pressure, and cholesterol levels.  Your activity level.  Other health conditions you have, such as heart or kidney disease. How do carbohydrates affect me? Carbohydrates, also called carbs, affect your blood glucose level more than any other type of food. Eating carbs naturally raises the amount of glucose in your blood. Carb counting is a method for keeping track of how many carbs you eat. Counting carbs is important to keep your blood glucose at a healthy level, especially if you use insulin or take certain oral diabetes medicines. It is important to know how many carbs  you can safely have in each meal. This is different for every person. Your dietitian can help you calculate how many carbs you should have at each meal and for each snack. Foods that contain carbs include:  Bread, cereal, rice, pasta, and crackers.  Potatoes and corn.  Peas, beans, and lentils.  Milk and yogurt.  Fruit and juice.  Desserts, such as cakes, cookies, ice cream, and candy. How does alcohol affect me? Alcohol can cause a sudden decrease in blood glucose (hypoglycemia), especially if you use insulin or take certain oral diabetes medicines. Hypoglycemia can be a life-threatening condition. Symptoms of hypoglycemia (sleepiness, dizziness, and confusion) are similar to symptoms of having too much alcohol. If your health care provider says that alcohol is safe for you, follow these guidelines:  Limit alcohol intake to no more than 1 drink per day for nonpregnant women and 2 drinks per day for men. One drink equals 12 oz of beer, 5 oz of wine, or 1 oz of hard liquor.  Do not drink on an empty stomach.  Keep yourself hydrated with water, diet soda, or unsweetened iced tea.  Keep in mind that regular soda, juice, and other mixers may contain a lot of sugar and must be counted as carbs. What are tips for following this plan?  Reading food labels  Start by checking the serving size on the "Nutrition Facts" label of packaged foods and drinks. The amount of calories, carbs, fats, and other nutrients listed on the label is based on one serving  of the item. Many items contain more than one serving per package.  Check the total grams (g) of carbs in one serving. You can calculate the number of servings of carbs in one serving by dividing the total carbs by 15. For example, if a food has 30 g of total carbs, it would be equal to 2 servings of carbs.  Check the number of grams (g) of saturated and trans fats in one serving. Choose foods that have low or no amount of these fats.  Check  the number of milligrams (mg) of salt (sodium) in one serving. Most people should limit total sodium intake to less than 2,300 mg per day.  Always check the nutrition information of foods labeled as "low-fat" or "nonfat". These foods may be higher in added sugar or refined carbs and should be avoided.  Talk to your dietitian to identify your daily goals for nutrients listed on the label. Shopping  Avoid buying canned, premade, or processed foods. These foods tend to be high in fat, sodium, and added sugar.  Shop around the outside edge of the grocery store. This includes fresh fruits and vegetables, bulk grains, fresh meats, and fresh dairy. Cooking  Use low-heat cooking methods, such as baking, instead of high-heat cooking methods like deep frying.  Cook using healthy oils, such as olive, canola, or sunflower oil.  Avoid cooking with butter, cream, or high-fat meats. Meal planning  Eat meals and snacks regularly, preferably at the same times every day. Avoid going long periods of time without eating.  Eat foods high in fiber, such as fresh fruits, vegetables, beans, and whole grains. Talk to your dietitian about how many servings of carbs you can eat at each meal.  Eat 4-6 ounces (oz) of lean protein each day, such as lean meat, chicken, fish, eggs, or tofu. One oz of lean protein is equal to: ? 1 oz of meat, chicken, or fish. ? 1 egg. ?  cup of tofu.  Eat some foods each day that contain healthy fats, such as avocado, nuts, seeds, and fish. Lifestyle  Check your blood glucose regularly.  Exercise regularly as told by your health care provider. This may include: ? 150 minutes of moderate-intensity or vigorous-intensity exercise each week. This could be brisk walking, biking, or water aerobics. ? Stretching and doing strength exercises, such as yoga or weightlifting, at least 2 times a week.  Take medicines as told by your health care provider.  Do not use any products that  contain nicotine or tobacco, such as cigarettes and e-cigarettes. If you need help quitting, ask your health care provider.  Work with a Social worker or diabetes educator to identify strategies to manage stress and any emotional and social challenges. Questions to ask a health care provider  Do I need to meet with a diabetes educator?  Do I need to meet with a dietitian?  What number can I call if I have questions?  When are the best times to check my blood glucose? Where to find more information:  American Diabetes Association: diabetes.org  Academy of Nutrition and Dietetics: www.eatright.[employer] of Diabetes and Digestive and Kidney Diseases (NIH): [employer] Summary  A healthy meal plan will help you control your blood glucose and maintain a healthy lifestyle.  Working with a diet and nutrition specialist (dietitian) can help you make a meal plan that is best for you.  Keep in mind that carbohydrates (carbs) and alcohol have immediate effects on your blood glucose  levels. It is important to count carbs and to use alcohol carefully. This information is not intended to replace advice given to you by your health care provider. Make sure you discuss any questions you have with your health care provider. Document Revised: 04/01/2017 Document Reviewed: 05/24/2016 Elsevier Patient Education  2020 [employer].

## 2020-03-17 NOTE — Assessment & Plan Note (Signed)
Infrequent symptoms, using famotidine as needed. Doing well on this regimen, continue same.

## 2020-03-17 NOTE — Assessment & Plan Note (Signed)
Likely uncontrolled, has also not been in for follow-up in over 1 year despite recommendations.  We did place referral to endocrinology as requested by patient.  Repeat A1c pending today. We will reduce Metformin to XR 500 mg due to GI upset with 750 mg dose. Continue Jardiance 25 mg daily. Continue Lantus at 25 units for now.  Await results.

## 2020-03-18 LAB — RHEUMATOID FACTOR: Rheumatoid fact SerPl-aCnc: <14 IU/mL (ref <14)

## 2020-03-18 LAB — CYCLIC CITRUL PEPTIDE ANTIBODY, IGG: Cyclic Citrullin Peptide Ab: <16 UNITS

## 2020-03-19 ENCOUNTER — Other Ambulatory Visit: Payer: Self-pay | Admitting: Primary Care

## 2020-03-19 DIAGNOSIS — E039 Hypothyroidism, unspecified: Secondary | ICD-10-CM

## 2020-03-19 DIAGNOSIS — E785 Hyperlipidemia, unspecified: Secondary | ICD-10-CM

## 2020-03-19 MED ORDER — LEVOTHYROXINE SODIUM 75 MCG PO TABS: ORAL_TABLET | ORAL | 3 refills | Status: DC

## 2020-03-19 MED ORDER — ROSUVASTATIN CALCIUM 5 MG PO TABS: 5.0000 mg | ORAL_TABLET | Freq: Every day | ORAL | 3 refills | Status: DC

## 2020-03-19 MED ORDER — PRAVASTATIN SODIUM 40 MG PO TABS: 40.0000 mg | ORAL_TABLET | Freq: Every day | ORAL | 3 refills | Status: DC

## 2020-03-24 ENCOUNTER — Other Ambulatory Visit: Payer: Self-pay | Admitting: Primary Care

## 2020-03-24 DIAGNOSIS — E119 Type 2 diabetes mellitus without complications: Secondary | ICD-10-CM

## 2020-03-24 DIAGNOSIS — Z794 Long term (current) use of insulin: Secondary | ICD-10-CM

## 2020-04-02 DIAGNOSIS — M25641 Stiffness of right hand, not elsewhere classified: Secondary | ICD-10-CM

## 2020-04-17 ENCOUNTER — Encounter: Payer: Self-pay | Admitting: Internal Medicine

## 2020-05-07 ENCOUNTER — Other Ambulatory Visit: Payer: Self-pay | Admitting: Primary Care

## 2020-05-07 DIAGNOSIS — E119 Type 2 diabetes mellitus without complications: Secondary | ICD-10-CM

## 2020-05-07 NOTE — Telephone Encounter (Signed)
Last OV 03/17/20 Last fill 02/07/20  #90/0

## 2020-05-09 ENCOUNTER — Other Ambulatory Visit: Payer: Self-pay | Admitting: Primary Care

## 2020-05-09 DIAGNOSIS — E119 Type 2 diabetes mellitus without complications: Secondary | ICD-10-CM

## 2020-05-09 DIAGNOSIS — K219 Gastro-esophageal reflux disease without esophagitis: Secondary | ICD-10-CM

## 2020-05-10 NOTE — Telephone Encounter (Signed)
Please advise 

## 2020-05-11 DIAGNOSIS — E119 Type 2 diabetes mellitus without complications: Secondary | ICD-10-CM

## 2020-05-12 MED ORDER — INSULIN DETEMIR 100 UNIT/ML FLEXPEN: 25.0000 [IU] | PEN_INJECTOR | Freq: Every day | SUBCUTANEOUS | 1 refills | Status: DC

## 2020-05-12 MED ORDER — BD PEN NEEDLE MICRO U/F 32G X 6 MM MISC: 0 refills | Status: DC

## 2020-05-14 ENCOUNTER — Other Ambulatory Visit: Payer: Self-pay | Admitting: *Deleted

## 2020-05-14 NOTE — Telephone Encounter (Signed)
LVM for patient to call back and schedule

## 2020-05-21 NOTE — Telephone Encounter (Signed)
LVM to call and schedule

## 2020-05-22 MED ORDER — CARVEDILOL 12.5 MG PO TABS: 12.5000 mg | ORAL_TABLET | Freq: Two times a day (BID) | ORAL | 0 refills | Status: DC

## 2020-05-27 NOTE — Telephone Encounter (Signed)
LVM for patient to call back. ?

## 2020-05-28 ENCOUNTER — Emergency Department (HOSPITAL_COMMUNITY)
Admission: EM | Admit: 2020-05-28 | Discharge: 2020-05-28 | Disposition: A | Discharge status: Home / Self Care | Payer: BC Managed Care – PPO | Attending: Emergency Medicine | Admitting: Emergency Medicine

## 2020-05-28 ENCOUNTER — Emergency Department (HOSPITAL_COMMUNITY): Payer: BC Managed Care – PPO

## 2020-05-28 ENCOUNTER — Other Ambulatory Visit: Payer: Self-pay

## 2020-05-28 ENCOUNTER — Encounter (HOSPITAL_COMMUNITY): Payer: Self-pay | Admitting: Emergency Medicine

## 2020-05-28 ENCOUNTER — Telehealth: Payer: Self-pay

## 2020-05-28 DIAGNOSIS — Z794 Long term (current) use of insulin: Secondary | ICD-10-CM | POA: Insufficient documentation

## 2020-05-28 DIAGNOSIS — Z7982 Long term (current) use of aspirin: Secondary | ICD-10-CM | POA: Insufficient documentation

## 2020-05-28 DIAGNOSIS — Z7984 Long term (current) use of oral hypoglycemic drugs: Secondary | ICD-10-CM | POA: Insufficient documentation

## 2020-05-28 DIAGNOSIS — R0781 Pleurodynia: Secondary | ICD-10-CM | POA: Insufficient documentation

## 2020-05-28 DIAGNOSIS — R109 Unspecified abdominal pain: Secondary | ICD-10-CM | POA: Insufficient documentation

## 2020-05-28 DIAGNOSIS — R1011 Right upper quadrant pain: Secondary | ICD-10-CM | POA: Diagnosis not present

## 2020-05-28 DIAGNOSIS — E785 Hyperlipidemia, unspecified: Secondary | ICD-10-CM | POA: Insufficient documentation

## 2020-05-28 DIAGNOSIS — I10 Essential (primary) hypertension: Secondary | ICD-10-CM | POA: Insufficient documentation

## 2020-05-28 DIAGNOSIS — E1169 Type 2 diabetes mellitus with other specified complication: Secondary | ICD-10-CM | POA: Insufficient documentation

## 2020-05-28 DIAGNOSIS — R079 Chest pain, unspecified: Secondary | ICD-10-CM | POA: Diagnosis not present

## 2020-05-28 DIAGNOSIS — E039 Hypothyroidism, unspecified: Secondary | ICD-10-CM | POA: Insufficient documentation

## 2020-05-28 DIAGNOSIS — M25511 Pain in right shoulder: Secondary | ICD-10-CM | POA: Insufficient documentation

## 2020-05-28 DIAGNOSIS — Z79899 Other long term (current) drug therapy: Secondary | ICD-10-CM | POA: Diagnosis not present

## 2020-05-28 LAB — CBC
HCT: 44.5 % (ref 36.0–46.0)
Hemoglobin: 15.3 g/dL — ABNORMAL HIGH (ref 12.0–15.0)
MCH: 31.7 pg (ref 26.0–34.0)
MCHC: 34.4 g/dL (ref 30.0–36.0)
MCV: 92.1 fL (ref 80.0–100.0)
Platelets: 215 10*3/uL (ref 150–400)
RBC: 4.83 MIL/uL (ref 3.87–5.11)
RDW: 11.5 % (ref 11.5–15.5)
WBC: 5.5 10*3/uL (ref 4.0–10.5)
nRBC: 0 % (ref 0.0–0.2)

## 2020-05-28 LAB — URINALYSIS, ROUTINE W REFLEX MICROSCOPIC
Bacteria, UA: NONE SEEN
Bilirubin Urine: NEGATIVE
Glucose, UA: >=500 mg/dL — AB
Hgb urine dipstick: NEGATIVE
Ketones, ur: NEGATIVE mg/dL
Leukocytes,Ua: NEGATIVE
Nitrite: NEGATIVE
Protein, ur: NEGATIVE mg/dL
Specific Gravity, Urine: 1.023 (ref 1.005–1.030)
pH: 7 (ref 5.0–8.0)

## 2020-05-28 LAB — COMPREHENSIVE METABOLIC PANEL
ALT: 25 U/L (ref 0–44)
AST: 24 U/L (ref 15–41)
Albumin: 4.3 g/dL (ref 3.5–5.0)
Alkaline Phosphatase: 88 U/L (ref 38–126)
Anion gap: 14 (ref 5–15)
BUN: 23 mg/dL — ABNORMAL HIGH (ref 6–20)
CO2: 24 mmol/L (ref 22–32)
Calcium: 9.2 mg/dL (ref 8.9–10.3)
Chloride: 97 mmol/L — ABNORMAL LOW (ref 98–111)
Creatinine, Ser: 0.97 mg/dL (ref 0.44–1.00)
GFR, Estimated: >60 mL/min (ref >60)
Glucose, Bld: 425 mg/dL — ABNORMAL HIGH (ref 70–99)
Potassium: 3.7 mmol/L (ref 3.5–5.1)
Sodium: 135 mmol/L (ref 135–145)
Total Bilirubin: 0.8 mg/dL (ref 0.3–1.2)
Total Protein: 7.6 g/dL (ref 6.5–8.1)

## 2020-05-28 LAB — LIPASE, BLOOD: Lipase: 42 U/L (ref 11–51)

## 2020-05-28 LAB — I-STAT BETA HCG BLOOD, ED (MC, WL, AP ONLY): I-stat hCG, quantitative: 6.2 m[IU]/mL — ABNORMAL HIGH (ref <5)

## 2020-05-28 NOTE — Telephone Encounter (Signed)
I spoke with Melanie Navarro; Melanie Navarro is going to [facility] ED now. FYI to Gentry Fitz NP.

## 2020-05-28 NOTE — ED Provider Notes (Signed)
[facility] DEPT Provider Note   CSN: [id number] Arrival date & time: 05/28/20  1022     History Chief Complaint  Patient presents with  . Abdominal Pain    Melanie Navarro is a 60 y.o. female.  HPI She presents for evaluation of pain and right flank, over the ribs, and her right shoulder and right trapezius area for 3 days.  The pain worsened somewhat last night however is tolerable.  The pain is somewhat worse when leaning forward.  She denies trauma.  She denies dysuria or urinary frequency.  There is been no blood in the urine or stool.  She is eating normally.  She denies weakness, dizziness or paresthesia.  No prior similar problems.  No nausea, vomiting or diarrhea.  There are no other known modifying factors.    Past Medical History:  Diagnosis Date  . Diabetes mellitus   . History of echocardiogram    a. 07/2019 Echo: EF 60-65%. no rwma. Nl RV size/fxn. Mild Ao sclerosis w/o stenosis.   . Hyperlipidemia   . Hypertension   . Nocturnal Bradycardia    a. 06/2019 Zio: Mobitz I progressing to CHB w/ up to 3.6 sec pause (2 episodes)-->sleep study pending.  . Palpitations    a. 06/2019 Zio: Avg HR 72 (max/min 38/135). Rare PACs/PVCs. 9 atrial runs up to 9 beat/max 135 bpm. 2 brief episodes of Mobitz I progressing to CHB w/ longest pause of 3.7 secs - episodes occrred overnight/early morning hrs.    Patient Active Problem List   Diagnosis Date Noted  . Stiffness of joints of both hands 03/17/2020  . Gastroesophageal reflux disease 03/17/2020  . Rhinitis 08/22/2019  . Chronic back pain 11/22/2018  . Hypothyroidism 05/18/2018  . Hyperlipidemia 12/09/2016  . Insomnia 12/31/2015  . Chest pain of uncertain etiology [id number]  . Palpitations 10/27/2011  . Essential hypertension 10/27/2011  . Encounter for preventive health examination 10/27/2011  . Diabetes mellitus ([place]) 10/27/2011    Past Surgical History:  Procedure Laterality Date  . WISDOM  TOOTH EXTRACTION  1984     OB History   No obstetric history on file.     Family History  Problem Relation Age of Onset  . Stroke Mother   . Hypertension Mother   . Heart disease Mother   . Diabetes Mother   . Peripheral Artery Disease Mother   . Diabetes Father   . Cancer Father        Lung  . Diabetes Brother   . Heart attack Brother 7  . Colon cancer Neg Hx     Social History   Tobacco Use  . Smoking status: Never Smoker  . Smokeless tobacco: Never Used  Vaping Use  . Vaping Use: Never used  Substance Use Topics  . Alcohol use: Not Currently    Comment: 1 drink per month  . Drug use: No    Home Medications Prior to Admission medications   Medication Sig Start Date End Date Taking? Authorizing Provider  aspirin 81 MG tablet Take 81 mg by mouth daily.    [provider]  carvedilol (COREG) 12.5 MG tablet Take 1 tablet (12.5 mg total) by mouth 2 (two) times daily. 05/22/20 08/20/20  Theora Gianotti, NP  cyclobenzaprine (FLEXERIL) 5 MG tablet Take 1 tablet by mouth once daily as needed for muscle spasms. 11/22/18   Pleas Koch, NP  famotidine (PEPCID) 20 MG tablet Take 1 tablet (20 mg total) by mouth daily. As  needed for heartburn. 05/10/20 08/08/20  Doreene Nestlark, Katherine K, NP  glucose blood test strip Use as instructed 06/09/15   Carollee Leitzoss, Carrie M, RN  insulin detemir (LEVEMIR) 100 UNIT/ML FlexPen Inject 25 Units into the skin daily. 05/12/20   Doreene Nestlark, Katherine K, NP  Insulin Pen Needle (BD PEN NEEDLE MICRO U/F) 32G X 6 MM MISC USE WITH INSULIN EVERY NIGHT AT BEDTIME AS DIRECTED 05/12/20   Doreene Nestlark, Katherine K, NP  JARDIANCE 25 MG TABS tablet TAKE 1 TABLET BY MOUTH ONCE DAILY FOR DIABETES 05/07/20   Doreene Nestlark, Katherine K, NP  Lancets [facility](ONETOUCH ULTRASOFT) lancets Use as instructed 06/09/15   Carollee Leitzoss, Carrie M, RN  levothyroxine (SYNTHROID) 75 MCG tablet Take 1 tablet by mouth every morning on an empty stomach with water only.  No food or other medications for 30 minutes.  03/19/20   Doreene Nestlark, Katherine K, NP  losartan-hydrochlorothiazide (HYZAAR) 100-25 MG tablet Take 1 tablet by mouth daily. For blood pressure. 03/17/20   Doreene Nestlark, Katherine K, NP  metFORMIN (GLUCOPHAGE XR) 500 MG 24 hr tablet Take 1 tablet (500 mg total) by mouth daily with breakfast. For diabetes. 03/17/20   Doreene Nestlark, Katherine K, NP  pravastatin (PRAVACHOL) 40 MG tablet Take 1 tablet (40 mg total) by mouth daily. For cholesterol. 03/19/20   Doreene Nestlark, Katherine K, NP    Allergies    Elemental sulfur, Glipizide, Penicillins, and Codeine  Review of Systems   Review of Systems  All other systems reviewed and are negative.   Physical Exam Updated Vital Signs BP 130/68 (BP Location: Left Arm)   Pulse 60   Temp 98 F (36.7 C) (Oral)   Resp 18   SpO2 98%   Physical Exam Vitals and nursing note reviewed.  Constitutional:      General: She is not in acute distress.    Appearance: She is well-developed and well-nourished. She is obese. She is not ill-appearing, toxic-appearing or diaphoretic.  HENT:     Head: Normocephalic and atraumatic.     Right Ear: External ear normal.     Left Ear: External ear normal.  Eyes:     Extraocular Movements: EOM normal.     Conjunctiva/sclera: Conjunctivae normal.     Pupils: Pupils are equal, round, and reactive to light.  Neck:     Trachea: Phonation normal.  Cardiovascular:     Rate and Rhythm: Normal rate and regular rhythm.     Heart sounds: Normal heart sounds.  Pulmonary:     Effort: Pulmonary effort is normal. No respiratory distress.     Breath sounds: Normal breath sounds. No stridor. No wheezing or rhonchi.  Chest:     Chest wall: No bony tenderness.  Abdominal:     General: There is no distension.     Palpations: Abdomen is soft.     Tenderness: There is no abdominal tenderness.  Musculoskeletal:        General: Normal range of motion.     Cervical back: Normal range of motion and neck supple.     Comments: Mild diffuse tenderness right  trapezius, right shoulder both anteriorly and posteriorly, and right lateral chest wall.  No altered motion.  Skin:    General: Skin is warm, dry and intact.  Neurological:     Mental Status: She is alert and oriented to person, place, and time.     Cranial Nerves: No cranial nerve deficit.     Sensory: No sensory deficit.     Motor: No abnormal muscle tone.  Coordination: Coordination normal.  Psychiatric:        Mood and Affect: Mood and affect normal.        Behavior: Behavior normal.        Thought Content: Thought content normal.        Judgment: Judgment normal.     ED Results / Procedures / Treatments   Labs (all labs ordered are listed, but only abnormal results are displayed) Labs Reviewed  COMPREHENSIVE METABOLIC PANEL - Abnormal; Notable for the following components:      Result Value   Chloride 97 (*)    Glucose, Bld 425 (*)    BUN 23 (*)    All other components within normal limits  CBC - Abnormal; Notable for the following components:   Hemoglobin 15.3 (*)    All other components within normal limits  URINALYSIS, ROUTINE W REFLEX MICROSCOPIC - Abnormal; Notable for the following components:   Color, Urine STRAW (*)    Glucose, UA >=500 (*)    All other components within normal limits  I-STAT BETA HCG BLOOD, ED ([facility], [facility], [facility] ONLY) - Abnormal; Notable for the following components:   I-stat hCG, quantitative 6.2 (*)    All other components within normal limits  LIPASE, BLOOD    EKG None  Radiology DG Chest 2 View  Result Date: 05/28/2020 CLINICAL DATA:  Right-sided chest pain, initial encounter EXAM: CHEST - 2 VIEW COMPARISON:  05/02/2019 FINDINGS: The heart size and mediastinal contours are within normal limits. Both lungs are clear. The visualized skeletal structures are unremarkable. IMPRESSION: No active cardiopulmonary disease. Electronically Signed   By: Inez Catalina M.D.   On: 05/28/2020 13:01   US Abdomen Limited  Result Date: 05/28/2020 CLINICAL  DATA:  RUQ pain EXAM: ULTRASOUND ABDOMEN LIMITED RIGHT UPPER QUADRANT COMPARISON:  05/03/2019 and prior. FINDINGS: Gallbladder: No gallstones or wall thickening visualized. No sonographic Murphy sign noted by sonographer. Common bile duct: Diameter: 4.0 mm Liver: No focal lesion identified. Within normal limits in parenchymal echogenicity. Portal vein is patent on color Doppler imaging with normal direction of blood flow towards the liver. Other: None. IMPRESSION: Unremarkable right upper quadrant ultrasound. Electronically Signed   By: Primitivo Gauze M.D.   On: 05/28/2020 12:17    Procedures Procedures   Medications Ordered in ED Medications - No data to display  ED Course  I have reviewed the triage vital signs and the nursing notes.  Pertinent labs & imaging results that were available during my care of the patient were reviewed by me and considered in my medical decision making (see chart for details).    MDM Rules/Calculators/A&P                           Patient Vitals for the past 24 hrs:  BP Temp Temp src Pulse Resp SpO2  05/28/20 1347 130/68 - - 60 18 98 %  05/28/20 1036 124/72 98 F (36.7 C) Oral 63 17 100 %    3:58 PM Reevaluation with update and discussion. After initial assessment and treatment, an updated evaluation reveals no further complaints, findings discussed with the patient and all questions were answered. Daleen Bo   Medical Decision Making:  This patient is presenting for evaluation of flank pain and shoulder pain, which does require a range of treatment options, and is a complaint that involves a moderate risk of morbidity and mortality. The differential diagnoses include UTI, gallbladder disease, pulmonary process, cervical radiculopathy,  shingles. I decided to review old records, and in summary middle-aged female presenting with 3 days of atraumatic right flank and right shoulder pain.  No clear defined areas on clinical evaluation.  No significant  risk factors..  I did not require additional historical information from Anyone.  Clinical Laboratory Tests Ordered, included CBC, Metabolic panel and Urinalysis. Review indicates normal findings. Radiologic Tests Ordered, included chest x-ray, ultrasound abdomen.  I independently Visualized: Radiologic images, which show no acute abnormalities   Critical Interventions-clinical evaluation, imaging with x-ray and ultrasound, laboratory tests, observation and reassessment  After These Interventions, the Patient was reevaluated and was found stable for discharge.  No evidence for UTI, pneumonia, gallbladder disease, significant cervical radiculopathy or shingles.  CRITICAL CARE-no Performed by: Daleen Bo  Nursing Notes Reviewed/ Care Coordinated Applicable Imaging Reviewed Interpretation of Laboratory Data incorporated into ED treatment  The patient appears reasonably screened and/or stabilized for discharge and I doubt any other medical condition or other [facility] requiring further screening, evaluation, or treatment in the ED at this time prior to discharge.  Plan: Home Medications-symptomatic relief, and usual medicine; Home Treatments-heat to affected area, gradual advance activity; return here if the recommended treatment, does not improve the symptoms; Recommended follow up-PCP, as needed     Final Clinical Impression(s) / ED Diagnoses Final diagnoses:  Flank pain  Right shoulder pain, unspecified chronicity    Rx / DC Orders ED Discharge Orders    None       Daleen Bo, MD 05/28/20 1558

## 2020-05-28 NOTE — Telephone Encounter (Signed)
Unable to contact after multiple attempts ..  closing encounter.

## 2020-05-28 NOTE — ED Notes (Signed)
Urine Culture sent with UA 

## 2020-05-28 NOTE — Discharge Instructions (Addendum)
There are no signs of urine infection, pneumonia, gall bladder disease or significant nerve/spine problems. Use Tylenol and heat for the discomfort. Watch for a rash that could indicate Shingles. See your doctor for problems.

## 2020-05-28 NOTE — ED Triage Notes (Signed)
Patient c/o RUQ pains that radiates to her back for 3-4 days. Reports that pain worse after eating heavily or laying on right side or bending. Denies n/v, bowel or urinary problems.

## 2020-05-28 NOTE — Telephone Encounter (Signed)
[name] - Client TELEPHONE ADVICE RECORD [facility] Patient Name: Melanie Navarro Gender: Female DOB: 1961-01-15 Age: 60 Y 34 M 28 D Return Phone Number: [phone] (Primary) Address: City/State/Zip: [place] [address] Client [facility] - Client Client [facility] [place] - Day Physician Alma Friendly - NP Contact Type Call Who Is Calling Patient / Member / Family / Caregiver Call Type Triage / Clinical Relationship To Patient Self Return Phone Number [phone] (Primary) Chief Complaint Flank Pain Reason for Call Symptomatic / Request for [place] states she is experiencing flank pain, shooting up her back also, right side. No opening today for appointments per office. [place] Not Listed [facility] Translation No Nurse Assessment Nurse: Lucky Cowboy, RN, Levada Dy Date/Time (Eastern Time): 05/28/2020 8:35:10 AM Confirm and document reason for call. If symptomatic, describe symptoms. ---Caller stated that she's had rt flank pain since the weekend. She stated that it goes into her rt shoulder. She stated that it is intermittent. No difficulties with urination, fever, vomiting. Does the patient have any new or worsening symptoms? ---Yes Will a triage be completed? ---Yes Related visit to physician within the last 2 weeks? ---No Does the PT have any chronic conditions? (i.e. diabetes, asthma, this includes High risk factors for pregnancy, etc.) ---Yes List chronic conditions. ---diabetes, HTN, thyroid, high cholesterol Is this a behavioral health or substance abuse call? ---No Guidelines Guideline Title Affirmed Question Affirmed Notes Nurse Date/Time Eilene Ghazi Time) Flank Pain [1] Abdominal pain AND [2] age > 90 years Lucky Cowboy, RN, Levada Dy 05/28/2020 8:36:40 AM Disp. Time Eilene Ghazi Time) Disposition Final User 05/28/2020 8:38:52 AM Go to ED Now Yes Dew, RN, Marin Shutter  Disagree/Comply Comply Caller Understands Yes PLEASE NOTE: All timestamps contained within this report are represented as [place] Standard Time. CONFIDENTIALTY NOTICE: This fax transmission is intended only for the addressee. It contains information that is legally privileged, confidential or otherwise protected from use or disclosure. If you are not the intended recipient, you are strictly prohibited from reviewing, disclosing, copying using or disseminating any of this information or taking any action in reliance on or regarding this information. If you have received this fax in error, please notify us immediately by telephone so that we can arrange for its return to [place]. Phone: [phone], Toll-Free: [phone], Fax: [phone] Page: 2 of 2 Call Id: [id number] PreDisposition Go to ED Care Advice Given Per Guideline GO TO ED NOW: * You need to be seen in the Emergency Department. * Go to the ED at ___________ [place] now. Drive carefully. BRING MEDICINES: * Bring a list of your current medicines when you go to the Emergency Department (ER). * Bring the pill bottles too. This will help the doctor (or NP/PA) to make certain you are taking the right medicines and the right dose. CARE ADVICE given per Flank Pain (Adult) guideline. Referrals GO TO FACILITY OTHER - SPECIFY

## 2020-05-28 NOTE — Telephone Encounter (Signed)
Noted, she is currently at [facility]. Triage note reviewed.

## 2020-06-06 ENCOUNTER — Telehealth: Payer: Self-pay | Admitting: Internal Medicine

## 2020-06-06 NOTE — Telephone Encounter (Signed)
Patient has been contacted at least 3 times for a recall, recall has been deleted

## 2020-07-02 DIAGNOSIS — M2550 Pain in unspecified joint: Secondary | ICD-10-CM | POA: Diagnosis not present

## 2020-07-02 DIAGNOSIS — M720 Palmar fascial fibromatosis [Dupuytren]: Secondary | ICD-10-CM | POA: Diagnosis not present

## 2020-07-02 DIAGNOSIS — M159 Polyosteoarthritis, unspecified: Secondary | ICD-10-CM | POA: Diagnosis not present

## 2020-07-04 ENCOUNTER — Other Ambulatory Visit: Payer: Self-pay

## 2020-07-04 MED ORDER — CARVEDILOL 12.5 MG PO TABS
12.5000 mg | ORAL_TABLET | Freq: Two times a day (BID) | ORAL | 0 refills | Status: DC
Start: 2020-07-04 — End: 2020-07-24

## 2020-07-16 ENCOUNTER — Ambulatory Visit (INDEPENDENT_AMBULATORY_CARE_PROVIDER_SITE_OTHER): Payer: BC Managed Care – PPO | Admitting: Internal Medicine

## 2020-07-16 ENCOUNTER — Other Ambulatory Visit: Payer: Self-pay

## 2020-07-16 ENCOUNTER — Encounter: Payer: Self-pay | Admitting: Internal Medicine

## 2020-07-16 VITALS — BP 134/86 | HR 88 | Ht 65.5 in | Wt 181.0 lb

## 2020-07-16 DIAGNOSIS — E1165 Type 2 diabetes mellitus with hyperglycemia: Secondary | ICD-10-CM

## 2020-07-16 DIAGNOSIS — E785 Hyperlipidemia, unspecified: Secondary | ICD-10-CM | POA: Insufficient documentation

## 2020-07-16 DIAGNOSIS — E039 Hypothyroidism, unspecified: Secondary | ICD-10-CM | POA: Insufficient documentation

## 2020-07-16 LAB — POCT GLYCOSYLATED HEMOGLOBIN (HGB A1C): Hemoglobin A1C: 10.8 % — AB (ref 4.0–5.6)

## 2020-07-16 LAB — POCT GLUCOSE (DEVICE FOR HOME USE): POC Glucose: 192 mg/dl — AB (ref 70–99)

## 2020-07-16 MED ORDER — EMPAGLIFLOZIN 25 MG PO TABS: 25.0000 mg | ORAL_TABLET | Freq: Every day | ORAL | 3 refills | Status: DC

## 2020-07-16 MED ORDER — DEXCOM G6 SENSOR MISC: 1.0000 | 3 refills | Status: DC

## 2020-07-16 MED ORDER — DEXCOM G6 TRANSMITTER MISC: 1.0000 | 3 refills | Status: DC

## 2020-07-16 MED ORDER — INSULIN PEN NEEDLE 32G X 4 MM MISC: 1.0000 | Freq: Every day | 3 refills | Status: DC

## 2020-07-16 MED ORDER — ONETOUCH VERIO VI STRP: ORAL_STRIP | 12 refills | Status: AC

## 2020-07-16 MED ORDER — METFORMIN HCL ER 500 MG PO TB24: 500.0000 mg | ORAL_TABLET | Freq: Every day | ORAL | 3 refills | Status: DC

## 2020-07-16 MED ORDER — TRESIBA FLEXTOUCH 100 UNIT/ML ~~LOC~~ SOPN: 22.0000 [IU] | PEN_INJECTOR | Freq: Every day | SUBCUTANEOUS | 3 refills | Status: DC

## 2020-07-16 MED ORDER — TRULICITY 0.75 MG/0.5ML ~~LOC~~ SOAJ: 0.7500 mg | SUBCUTANEOUS | 3 refills | Status: DC

## 2020-07-16 NOTE — Progress Notes (Signed)
Name: Melanie Navarro  MRN/ DOB: [id number], 31-May-1960   Age/ Sex: 60 y.o., female    PCP: Pleas Koch, NP   Reason for Endocrinology Evaluation: Type 2 Diabetes Mellitus     Date of Initial Endocrinology Visit: 07/16/2020     Melanie Navarro IDENTIFIER: Melanie Navarro is a 60 y.o. female with a past medical history of T2DM, HTN , hypothyroidism and Dyslipidemia. The Melanie Navarro presented for initial endocrinology clinic visit on 07/16/2020 for consultative assistance with her diabetes management.    HPI: Melanie Navarro was    Diagnosed with DM  Many yrs ago  Prior Medications tried/Intolerance: Glipizide - does not recall possible a rash . Intolerant to higher doses of metformin  Currently checking blood sugars occasionally    Hypoglycemia episodes : not sure             Symptoms: yes                Hemoglobin A1c has ranged from 8.2% in 2016, peaking at 12.8% in 2018. Melanie Navarro required assistance for hypoglycemia: no Melanie Navarro has required hospitalization within the last 1 year from hyper or hypoglycemia: no  In terms of diet, the Melanie Navarro eats 3 meals a day, snacks 2x . Avoids sugar- sweetened beverages  Was previously on Lantus   Has occasional diarrhea and upset stomach , no nausea or vomiting , has heart burn   THYROID HISTORY : Has been diagnosed with hypothyroidism ~ 2020 Has been on Lt-4 since .  Melanie Navarro is on Biotin  No local neck symptoms   NO Fh of thyroid disease      HOME DIABETES REGIMEN: Levemir 25 units  At night  Jardiance 25 mg daliy  Metformin 500 mg XR 1 tablet daily    Statin: yes ACE-I/ARB: yes Prior Diabetic Education: no    METER DOWNLOAD SUMMARY: Did not bring    DIABETIC COMPLICATIONS: Microvascular complications:   Minimal eye changes   Denies: CKD, neuropathy   Last eye exam: Completed 2020  Macrovascular complications:    Denies: CAD, PVD, CVA   PAST HISTORY: Past Medical History:  Past Medical History:  Diagnosis Date  . Diabetes  mellitus   . History of echocardiogram    a. 07/2019 Echo: EF 60-65%. no rwma. Nl RV size/fxn. Mild Ao sclerosis w/o stenosis.   . Hyperlipidemia   . Hypertension   . Nocturnal Bradycardia    a. 06/2019 Zio: Mobitz I progressing to CHB w/ up to 3.6 sec pause (2 episodes)-->sleep study pending.  . Palpitations    a. 06/2019 Zio: Avg HR 72 (max/min 38/135). Rare PACs/PVCs. 9 atrial runs up to 9 beat/max 135 bpm. 2 brief episodes of Mobitz I progressing to CHB w/ longest pause of 3.7 secs - episodes occrred overnight/early morning hrs.   Past Surgical History:  Past Surgical History:  Procedure Laterality Date  . [place] EXTRACTION  1984      Social History:  reports that Melanie Navarro has never smoked. Melanie Navarro has never used smokeless tobacco. Melanie Navarro reports previous alcohol use. Melanie Navarro reports that Melanie Navarro does not use drugs. Family History:  Family History  Problem Relation Age of Onset  . Stroke Mother   . Hypertension Mother   . Heart disease Mother   . Diabetes Mother   . Peripheral Artery Disease Mother   . Diabetes Father   . Lung cancer Father   . Diabetes Brother   . Heart attack Brother 27  . Osteoarthritis Paternal Grandmother   .  Colon cancer Neg Hx      HOME MEDICATIONS: Allergies as of 07/16/2020      Reactions   Elemental Sulfur    Rash   Glipizide    dizziness   Penicillins    Rash   Codeine Swelling, Rash      Medication List       Accurate as of July 16, 2020  4:07 PM. If you have any questions, ask your nurse or doctor.        STOP taking these medications   insulin detemir 100 UNIT/ML FlexPen Commonly known as: LEVEMIR Stopped by: Dorita Sciara, MD     TAKE these medications   aspirin 81 MG tablet Take 81 mg by mouth daily.   carvedilol 12.5 MG tablet Commonly known as: COREG Take 1 tablet (12.5 mg total) by mouth 2 (two) times daily.   cyclobenzaprine 5 MG tablet Commonly known as: FLEXERIL Take 1 tablet by mouth once daily as needed for  muscle spasms.   Dexcom G6 Sensor Misc 1 Device by Does not apply route as directed. Started by: Dorita Sciara, MD   Dexcom G6 Transmitter Misc 1 Device by Does not apply route as directed. Started by: Dorita Sciara, MD   empagliflozin 25 MG Tabs tablet Commonly known as: Jardiance Take 1 tablet (25 mg total) by mouth daily. What changed: See the new instructions. Changed by: Dorita Sciara, MD   famotidine 20 MG tablet Commonly known as: PEPCID Take 1 tablet (20 mg total) by mouth daily. As needed for heartburn.   Insulin Pen Needle 32G X 4 MM Misc 1 Device by Does not apply route daily. What changed:   medication strength  how much to take  how to take this  when to take this  additional instructions Changed by: Dorita Sciara, MD   levothyroxine 75 MCG tablet Commonly known as: SYNTHROID Take 1 tablet by mouth every morning on an empty stomach with water only.  No food or other medications for 30 minutes.   losartan-hydrochlorothiazide 100-25 MG tablet Commonly known as: HYZAAR Take 1 tablet by mouth daily. For blood pressure.   metFORMIN 500 MG 24 hr tablet Commonly known as: Glucophage XR Take 1 tablet (500 mg total) by mouth daily with supper. For diabetes. What changed: when to take this Changed by: Dorita Sciara, MD   onetouch ultrasoft lancets Use as instructed   OneTouch Verio test strip Generic drug: glucose blood Twice dAILY What changed: additional instructions Changed by: Dorita Sciara, MD   pravastatin 40 MG tablet Commonly known as: PRAVACHOL Take 1 tablet (40 mg total) by mouth daily. For cholesterol.   Tyler Aas FlexTouch 100 UNIT/ML FlexTouch Pen Generic drug: insulin degludec Inject 22 Units into the skin daily. Started by: Dorita Sciara, MD   Trulicity 2.02 RK/2.7CW Sopn Generic drug: Dulaglutide Inject 0.75 mg into the skin once a week. Started by: Dorita Sciara, MD         ALLERGIES: Allergies  Allergen Reactions  . Elemental Sulfur     Rash   . Glipizide     dizziness  . Penicillins     Rash   . Codeine Swelling and Rash     REVIEW OF SYSTEMS: A comprehensive ROS was conducted with the Melanie Navarro and is negative except as per HPI     OBJECTIVE:   VITAL SIGNS: BP 134/86   Pulse 88   Ht 5' 5.5" (1.664 m)   [facility]  181 lb (82.1 kg)   SpO2 98%   BMI 29.66 kg/m    PHYSICAL EXAM:  General: Melanie Navarro appears well and is in NAD  Neck: General: Supple without adenopathy or carotid bruits. Thyroid: Thyroid size normal.  No goiter or nodules appreciated.  Lungs: Clear with good BS bilat with no rales, rhonchi, or wheezes  Heart: RRR   Abdomen: Normoactive bowel sounds, soft, nontender, without masses or organomegaly palpable  Extremities:  Lower extremities - No pretibial edema. No lesions.  Neuro: MS is good with appropriate affect, Melanie Navarro is alert and Ox3    DM foot exam: 07/16/2020  The skin of the feet is intact without sores or ulcerations. The pedal pulses are 2+ on right and 2+ on left. The sensation is intact to a screening 5.07, 10 gram monofilament bilaterally   DATA REVIEWED:  Lab Results  Component Value Date   HGBA1C 10.8 (A) 07/16/2020   HGBA1C 10.6 (H) 03/17/2020   HGBA1C 9.7 (A) 11/22/2018   Lab Results  Component Value Date   MICROALBUR <0.7 12/31/2015   LDLCALC 125 (H) 03/17/2020   CREATININE 0.97 05/28/2020   Lab Results  Component Value Date   MICRALBCREAT 1.7 12/31/2015    Lab Results  Component Value Date   CHOL 224 (H) 03/17/2020   HDL 70.40 03/17/2020   LDLCALC 125 (H) 03/17/2020   TRIG 142.0 03/17/2020   CHOLHDL 3 03/17/2020        ASSESSMENT / PLAN / RECOMMENDATIONS:   1) Type 2 Diabetes Mellitus, Poorly controlled, Without complications - Most recent A1c of 10.8 %. Goal A1c < 7.0 %.      - I have discussed with the Melanie Navarro the pathophysiology of diabetes. We went over the natural progression of the  disease. We talked about both insulin resistance and insulin deficiency. We stressed the importance of lifestyle changes including diet and exercise. I explained the complications associated with diabetes including retinopathy, nephropathy, neuropathy as well as increased risk of cardiovascular disease. We went over the benefit seen with glycemic control.   - I explained to the Melanie Navarro that diabetic patients are at higher than normal risk for amputations. - Discussed pharmacokinetics of basal insulin and the importance of  avoiding sugar-sweetened beverages and snacks, when possible.  - Will switch Levemir to [place] as below  - Will start GLp-1 agonist , discussed GI side effects, no hx of pancreatitis  - Dexcom prescription sent  - A referral to our RD done   MEDICATIONS:  - Stop Levemir  - Start Tresiba 22 units daily - Start trulicity 7.56 mg weekly  - Continue Metformin 500 mg XR , 1 tablet with Supper  - Continue Jardiance 25 mg , 1 tablet in the morning   EDUCATION / INSTRUCTIONS:  BG monitoring instructions: Melanie Navarro is instructed to check her blood sugars 2 times a day, fasting and bedtime . Marland Kitchen Call [facility] if: BG persistently < 70  . I reviewed the Rule of 15 for the treatment of hypoglycemia in detail with the Melanie Navarro. Literature supplied.   2) Diabetic complications:   Eye: Does not have known diabetic retinopathy.   Neuro/ Feet: Does not have known diabetic peripheral neuropathy.  Renal: Melanie Navarro does not have known baseline CKD. Melanie Navarro is on an ACEI/ARB at present.      3) Dyslipidemia: Melanie Navarro is on Pravastatin. LDL continues to be high - Melanie Navarro is intolerant to other statins due to myalgia  - We disucssed adding Zetia if needed  in the future  - In the meantime Melanie Navarro will work on low fat diet    4) Hypothyroidism :  - Melanie Navarro educated extensively on the correct way to take levothyroxine (first thing in the morning with water, 30 minutes before eating or  taking other medications). - Melanie Navarro encouraged to double dose the following day if Melanie Navarro were to miss a dose given long half-life of levothyroxine.  Medication  Continue Levothyroxine 75 mcg daily    F/U in 3 months   Signed electronically by: Mack Guise, MD  [facility]  [facility] [address] [place], [place] [address] Phone: [phone] FAX: [phone]   CC: Pleas Koch, NP [place] [address] Phone: [phone]  Fax: [phone]    Return to Endocrinology clinic as below: Future Appointments  Date Time Provider [place]  10/22/2020 10:50 AM Kenyatte Gruber, Melanie Crazier, MD [id number] None

## 2020-07-16 NOTE — Patient Instructions (Addendum)
-   Stop Levemir  - Start Tresiba 22 units daily - Start trulicity 4.46 mg weekly  - Continue Metformin 500 mg XR , 1 tablet with Supper  - Continue Jardiance 25 mg , 1 tablet in the morning    Choose healthy, lower carb lower calorie snacks: toss salad, vegetables, cottage cheese, peanut butter, low fat cheese / string cheese, lower sodium deli meat, tuna salad or chicken salad     HOW TO TREAT LOW BLOOD SUGARS (Blood sugar LESS THAN 70 MG/DL)  Please follow the RULE OF 15 for the treatment of hypoglycemia treatment (when your (blood sugars are less than 70 mg/dL)    STEP 1: Take 15 grams of carbohydrates when your blood sugar is low, which includes:   3-4 GLUCOSE TABS  OR  3-4 OZ OF JUICE OR REGULAR SODA OR  ONE TUBE OF GLUCOSE GEL     STEP 2: RECHECK blood sugar in 15 MINUTES STEP 3: If your blood sugar is still low at the 15 minute recheck --> then, go back to STEP 1 and treat AGAIN with another 15 grams of carbohydrates.

## 2020-07-24 ENCOUNTER — Other Ambulatory Visit: Payer: Self-pay | Admitting: Nurse Practitioner

## 2020-07-24 NOTE — Telephone Encounter (Signed)
Please call pt to schedule follow-up appointment for refills.  Received fax from [facility] requesting refills for Carvedilol 12.5 mg.

## 2020-07-24 NOTE — Telephone Encounter (Signed)
LVM for the patient to call back

## 2020-07-28 ENCOUNTER — Telehealth: Payer: Self-pay | Admitting: Internal Medicine

## 2020-07-28 NOTE — Telephone Encounter (Signed)
error 

## 2020-07-28 NOTE — Telephone Encounter (Signed)
Attempted to schedule.  LMOV to call office.  ° °

## 2020-07-30 MED ORDER — CARVEDILOL 12.5 MG PO TABS
12.5000 mg | ORAL_TABLET | Freq: Two times a day (BID) | ORAL | 0 refills | Status: DC
Start: 2020-07-30 — End: 2020-09-12

## 2020-07-31 NOTE — Telephone Encounter (Signed)
LMV for patient to call back x3, encounter being closed, letter sent

## 2020-09-12 DIAGNOSIS — R002 Palpitations: Secondary | ICD-10-CM

## 2020-09-12 MED ORDER — CARVEDILOL 12.5 MG PO TABS
12.5000 mg | ORAL_TABLET | Freq: Two times a day (BID) | ORAL | 1 refills | Status: DC
Start: 2020-09-12 — End: 2021-01-14

## 2020-10-06 ENCOUNTER — Ambulatory Visit: Payer: BC Managed Care – PPO | Admitting: Dietician

## 2020-10-20 ENCOUNTER — Other Ambulatory Visit: Payer: Self-pay | Admitting: Primary Care

## 2020-10-20 DIAGNOSIS — R002 Palpitations: Secondary | ICD-10-CM

## 2020-10-22 ENCOUNTER — Ambulatory Visit: Payer: BC Managed Care – PPO | Admitting: Internal Medicine

## 2020-12-03 ENCOUNTER — Ambulatory Visit: Payer: BC Managed Care – PPO | Admitting: Internal Medicine

## 2021-01-14 ENCOUNTER — Other Ambulatory Visit: Payer: Self-pay | Admitting: Primary Care

## 2021-01-14 DIAGNOSIS — R002 Palpitations: Secondary | ICD-10-CM

## 2021-02-04 ENCOUNTER — Ambulatory Visit: Payer: BC Managed Care – PPO | Admitting: Internal Medicine

## 2021-02-07 ENCOUNTER — Other Ambulatory Visit: Payer: Self-pay | Admitting: Primary Care

## 2021-02-07 DIAGNOSIS — K219 Gastro-esophageal reflux disease without esophagitis: Secondary | ICD-10-CM

## 2021-02-18 ENCOUNTER — Other Ambulatory Visit: Payer: Self-pay | Admitting: Primary Care

## 2021-02-18 NOTE — Telephone Encounter (Signed)
Patient needs an appointment. She's also overdue for CPE, and close to being due for annual follow up.

## 2021-02-18 NOTE — Telephone Encounter (Signed)
Do you need to see in office to place order?

## 2021-02-19 ENCOUNTER — Other Ambulatory Visit: Payer: Self-pay

## 2021-02-19 ENCOUNTER — Encounter: Payer: Self-pay | Admitting: Primary Care

## 2021-02-19 ENCOUNTER — Ambulatory Visit (INDEPENDENT_AMBULATORY_CARE_PROVIDER_SITE_OTHER): Payer: BC Managed Care – PPO | Admitting: Primary Care

## 2021-02-19 VITALS — BP 132/84 | HR 77 | Temp 97.8°F | Ht 65.5 in | Wt 179.0 lb

## 2021-02-19 DIAGNOSIS — Z23 Encounter for immunization: Secondary | ICD-10-CM | POA: Diagnosis not present

## 2021-02-19 DIAGNOSIS — N644 Mastodynia: Secondary | ICD-10-CM | POA: Diagnosis not present

## 2021-02-19 MED ORDER — GABAPENTIN 100 MG PO CAPS: 100.0000 mg | ORAL_CAPSULE | Freq: Two times a day (BID) | ORAL | 0 refills | Status: DC | PRN

## 2021-02-19 NOTE — Patient Instructions (Addendum)
You will be contacted regarding your mammogram.  Please call them if you don't hear from them in a few days.  Start gabapentin 100 mg for pain. You can take 1-2 capsules twice daily as needed. This may cause drowsiness.   We will see you in December!  It was a pleasure to see you today!

## 2021-02-19 NOTE — Progress Notes (Signed)
Subjective:    Patient ID: Melanie Navarro, female    DOB: October 26, 1960, 60 y.o.   MRN: [id number]  HPI  Melanie Navarro is a very pleasant 60 y.o. female with a history of hypertension, type 2 diabetes, hypothyroidism, hyperlipidemia who presents today to discuss breast pain.  Her pain is located to the left medial breast which began about two weeks ago. Prior to symptom onset she was traveling in a car for 6 hours, seat belt was laying at the site, wasn't tight or uncomfortable.   She describes her pain as burning with radiation outward to the right medial breast. She's noticed tenderness to the site.   She denies skin texture changes, erythema, open wounds, family history of breast cancer, rash. She's never undergone a mammogram. Follows with endocrinology for diabetes, endorses last A1C was "high". Glucose recently is running in the 120-140's.    BP Readings from Last 3 Encounters:  02/19/21 132/84  07/16/20 134/86  05/28/20 130/68      Review of Systems  Genitourinary:        Acute left breast pain  Skin:  Negative for color change and rash.        Past Medical History:  Diagnosis Date   Diabetes mellitus    History of echocardiogram    a. 07/2019 Echo: EF 60-65%. no rwma. Nl RV size/fxn. Mild Ao sclerosis w/o stenosis.    Hyperlipidemia    Hypertension    Nocturnal Bradycardia    a. 06/2019 Zio: Mobitz I progressing to CHB w/ up to 3.6 sec pause (2 episodes)-->sleep study pending.   Palpitations    a. 06/2019 Zio: Avg HR 72 (max/min 38/135). Rare PACs/PVCs. 9 atrial runs up to 9 beat/max 135 bpm. 2 brief episodes of Mobitz I progressing to CHB w/ longest pause of 3.7 secs - episodes occrred overnight/early morning hrs.    Social History   Socioeconomic History   Marital status: Married    Spouse name: Not on file   Number of children: Not on file   Years of education: Not on file   Highest education level: Not on file  Occupational History   Not on file  Tobacco  Use   Smoking status: Never   Smokeless tobacco: Never  Vaping Use   Vaping Use: Never used  Substance and Sexual Activity   Alcohol use: Not Currently    Comment: 1 drink per month   Drug use: No   Sexual activity: Not on file  Other Topics Concern   Not on file  Social History Narrative   Married.   2 children. 1 grandchild.   Works in Science [employer].   Enjoys gardening, flowers, spending time with family.   Social Determinants of Health   Financial Resource Strain: Not on file  Food Insecurity: Not on file  Transportation Needs: Not on file  Physical Activity: Not on file  Stress: Not on file  Social Connections: Not on file  Intimate Partner Violence: Not on file    Past Surgical History:  Procedure Laterality Date   WISDOM TOOTH EXTRACTION  1984    Family History  Problem Relation Age of Onset   Stroke Mother    Hypertension Mother    Heart disease Mother    Diabetes Mother    Peripheral Artery Disease Mother    Diabetes Father    Lung cancer Father    Diabetes Brother    Heart attack Brother 65   Osteoarthritis Paternal Grandmother  Colon cancer Neg Hx     Allergies  Allergen Reactions   Elemental Sulfur     Rash    Glipizide     dizziness   Penicillins     Rash    Codeine Swelling and Rash    Current Outpatient Medications on File Prior to Visit  Medication Sig Dispense Refill   aspirin 81 MG tablet Take 81 mg by mouth daily.     carvedilol (COREG) 12.5 MG tablet Take 1 tablet (12.5 mg total) by mouth 2 (two) times daily. For palpitations. Office visit required for further refills. 180 tablet 0   Continuous Blood Gluc Sensor (DEXCOM G6 SENSOR) MISC 1 Device by Does not apply route as directed. 9 each 3   Continuous Blood Gluc Transmit (DEXCOM G6 TRANSMITTER) MISC 1 Device by Does not apply route as directed. 1 each 3   cyclobenzaprine (FLEXERIL) 5 MG tablet Take 1 tablet by mouth once daily as needed for muscle spasms. 30 tablet 0   Dulaglutide  (TRULICITY) 3.15 QM/0.8QP SOPN Inject 0.75 mg into the skin once a week. 6 mL 3   empagliflozin (JARDIANCE) 25 MG TABS tablet Take 1 tablet (25 mg total) by mouth daily. 90 tablet 3   famotidine (PEPCID) 20 MG tablet Take 1 tablet (20 mg total) by mouth daily. As needed for heartburn.  Office visit required in December for further refills. 30 tablet 1   glucose blood (ONETOUCH VERIO) test strip Twice dAILY 100 each 12   insulin degludec (TRESIBA FLEXTOUCH) 100 UNIT/ML FlexTouch Pen Inject 22 Units into the skin daily. 30 mL 3   Insulin Pen Needle 32G X 4 MM MISC 1 Device by Does not apply route daily. 150 each 3   Lancets (ONETOUCH ULTRASOFT) lancets Use as instructed 30 each 1   levothyroxine (SYNTHROID) 75 MCG tablet Take 1 tablet by mouth every morning on an empty stomach with water only.  No food or other medications for 30 minutes. 90 tablet 3   losartan-hydrochlorothiazide (HYZAAR) 100-25 MG tablet Take 1 tablet by mouth daily. For blood pressure. 90 tablet 3   metFORMIN (GLUCOPHAGE XR) 500 MG 24 hr tablet Take 1 tablet (500 mg total) by mouth daily with supper. For diabetes. 90 tablet 3   pravastatin (PRAVACHOL) 40 MG tablet Take 1 tablet (40 mg total) by mouth daily. For cholesterol. 90 tablet 3   No current facility-administered medications on file prior to visit.    BP 132/84   Pulse 77   Temp 97.8 F (36.6 C) (Temporal)   Ht 5' 5.5" (1.664 m)   Wt 179 lb (81.2 kg)   SpO2 95%   BMI 29.33 kg/m  Objective:   Physical Exam Constitutional:      General: She is not in acute distress. Cardiovascular:     Rate and Rhythm: Normal rate.  Pulmonary:     Effort: Pulmonary effort is normal.     Breath sounds: Normal breath sounds.  Chest:       Comments: Moderate tenderness with guarding to left medial breast at 9 o'clock position. Patient would not allow me to palpate much due to discomfort. No obvious masses felt or noted. Skin texture appears normal. No rash. Neurological:      Mental Status: She is alert.          Assessment & Plan:      This visit occurred during the SARS-CoV-2 public health emergency.  Safety protocols were in place, including screening questions prior to  the visit, additional usage of staff PPE, and extensive cleaning of exam room while observing appropriate contact time as indicated for disinfecting solutions.

## 2021-02-19 NOTE — Assessment & Plan Note (Signed)
Unclear etiology, does resemble nerve involvement. No obvious infection. She is quite tender. No evidence of herpes zoster.   Diagnostic mammogram and left breast ultrasound ordered and pending. Stressed the importance of regular screening mammograms.  Rx for gabapentin 100 mg sent to pharmacy. drowsiness precautions provided.

## 2021-03-04 ENCOUNTER — Other Ambulatory Visit: Payer: Self-pay | Admitting: Primary Care

## 2021-03-04 DIAGNOSIS — K219 Gastro-esophageal reflux disease without esophagitis: Secondary | ICD-10-CM

## 2021-03-25 ENCOUNTER — Encounter: Payer: Self-pay | Admitting: Internal Medicine

## 2021-03-25 ENCOUNTER — Ambulatory Visit (INDEPENDENT_AMBULATORY_CARE_PROVIDER_SITE_OTHER): Payer: BC Managed Care – PPO | Admitting: Internal Medicine

## 2021-03-25 ENCOUNTER — Other Ambulatory Visit: Payer: Self-pay

## 2021-03-25 VITALS — BP 136/84 | HR 65 | Ht 65.5 in | Wt 178.0 lb

## 2021-03-25 DIAGNOSIS — E1165 Type 2 diabetes mellitus with hyperglycemia: Secondary | ICD-10-CM | POA: Diagnosis not present

## 2021-03-25 DIAGNOSIS — E039 Hypothyroidism, unspecified: Secondary | ICD-10-CM

## 2021-03-25 DIAGNOSIS — E785 Hyperlipidemia, unspecified: Secondary | ICD-10-CM

## 2021-03-25 LAB — GLUCOSE, POCT (MANUAL RESULT ENTRY): POC Glucose: 119 mg/dl — AB (ref 70–99)

## 2021-03-25 LAB — POCT GLYCOSYLATED HEMOGLOBIN (HGB A1C): Hemoglobin A1C: 6.6 % — AB (ref 4.0–5.6)

## 2021-03-25 MED ORDER — TRESIBA FLEXTOUCH 100 UNIT/ML ~~LOC~~ SOPN: 20.0000 [IU] | PEN_INJECTOR | Freq: Every day | SUBCUTANEOUS | 3 refills | Status: DC

## 2021-03-25 MED ORDER — TRULICITY 1.5 MG/0.5ML ~~LOC~~ SOAJ: 1.5000 mg | SUBCUTANEOUS | 3 refills | Status: DC

## 2021-03-25 MED ORDER — EMPAGLIFLOZIN 25 MG PO TABS: 25.0000 mg | ORAL_TABLET | Freq: Every day | ORAL | 3 refills | Status: DC

## 2021-03-25 NOTE — Patient Instructions (Addendum)
-   Decrease Tresiba 20  units daily - Increase trulicity 1.5  mg weekly  - Stop  Metformin 500 mg XR , 1 tablet with Supper  - Continue Jardiance 25 mg , 1 tablet in the morning     HOW TO TREAT LOW BLOOD SUGARS (Blood sugar LESS THAN 70 MG/DL) Please follow the RULE OF 15 for the treatment of hypoglycemia treatment (when your (blood sugars are less than 70 mg/dL)   STEP 1: Take 15 grams of carbohydrates when your blood sugar is low, which includes:  3-4 GLUCOSE TABS  OR 3-4 OZ OF JUICE OR REGULAR SODA OR ONE TUBE OF GLUCOSE GEL    STEP 2: RECHECK blood sugar in 15 MINUTES STEP 3: If your blood sugar is still low at the 15 minute recheck --> then, go back to STEP 1 and treat AGAIN with another 15 grams of carbohydrates

## 2021-03-25 NOTE — Progress Notes (Signed)
Name: Melanie Navarro  MRN/ DOB: [id number], [birth year]   Age/ Sex: 60 y.o., female    PCP: Pleas Koch, NP   Reason for Endocrinology Evaluation: Type 2 Diabetes Mellitus     Date of Initial Endocrinology Visit: 07/16/2020    PATIENT IDENTIFIER: Melanie Navarro is a 60 y.o. female with a past medical history of T2DM, HTN , hypothyroidism and Dyslipidemia. The patient presented for initial endocrinology clinic visit on 07/16/2020 for consultative assistance with her diabetes management.    DIABETIC HISTORY:  Melanie Navarro  was diagnosed with DM many years ago. Glipizide - possible  rash . Intolerant to higher doses of metformin.  Her hemoglobin A1c has ranged from 8.2% in 2016, peaking at 12.8% in 2018.  On her initial visit to our clinic her A1c is 10.8 % she was on Levemir , Metformin and Jardiance, we witched her to [place] and started Trulicit as well as continuing with Metformin and Jardiance   THYROID HISTORY : Has been diagnosed with hypothyroidism ~ 2020 Has been on Lt-4 since .  She is on Biotin  No local neck symptoms   NO Fh of thyroid disease    SUBJECTIVE:   During the last visit (07/16/2020): A1c 10.8%  we witched her to [place] and started Trulicit as well as continuing with Metformin and Jardiance    Today (07/16/2020): Melanie Navarro is here for a follow up on diabetes management. She checks her blood sugars 1 times daily. The patient has  had hypoglycemic episodes since the last clinic visit, pt is symptomatic with these episodes.    Dexcom not approved by insurance  Takes metformin 1 tablet every other day, but continues with abdominal pain  Denies nausea, vomiting or diarrhea   She has noted left breast burning , no nipple pain , denies nipple discharge . Was started on Gabapentin which helped . Denies back pain . She has been painting and noted worsening pain. Pending diagnostic mammogram through PCP   HOME ENDOCRINE  REGIMEN: Tresiba  22 units  at night   Jardiance 25 mg daliy  Metformin 500 mg XR 1 tablet daily  Trulicity 7.59 mg weekly  Levothyroxine 75 mcg daily     Statin: yes ACE-I/ARB: yes Prior Diabetic Education: no    METER DOWNLOAD SUMMARY: Did not bring    DIABETIC COMPLICATIONS: Microvascular complications:  Minimal eye changes  Denies: CKD, neuropathy  Last eye exam: Completed 2020  Macrovascular complications:   Denies: CAD, PVD, CVA   PAST HISTORY: Past Medical History:  Past Medical History:  Diagnosis Date   Diabetes mellitus    History of echocardiogram    a. 07/2019 Echo: EF 60-65%. no rwma. Nl RV size/fxn. Mild Ao sclerosis w/o stenosis.    Hyperlipidemia    Hypertension    Nocturnal Bradycardia    a. 06/2019 Zio: Mobitz I progressing to CHB w/ up to 3.6 sec pause (2 episodes)-->sleep study pending.   Palpitations    a. 06/2019 Zio: Avg HR 72 (max/min 38/135). Rare PACs/PVCs. 9 atrial runs up to 9 beat/max 135 bpm. 2 brief episodes of Mobitz I progressing to CHB w/ longest pause of 3.7 secs - episodes occrred overnight/early morning hrs.   Past Surgical History:  Past Surgical History:  Procedure Laterality Date   WISDOM TOOTH EXTRACTION  1984    Social History:  reports that she has never smoked. She has never used smokeless tobacco. She reports that she does not currently use alcohol. She  reports that she does not use drugs. Family History:  Family History  Problem Relation Age of Onset   Stroke Mother    Hypertension Mother    Heart disease Mother    Diabetes Mother    Peripheral Artery Disease Mother    Diabetes Father    Lung cancer Father    Diabetes Brother    Heart attack Brother 73   Osteoarthritis Paternal Grandmother    Colon cancer Neg Hx      HOME MEDICATIONS: Allergies as of 03/25/2021       Reactions   Elemental Sulfur    Rash   Glipizide    dizziness   Penicillins    Rash   Codeine Swelling, Rash        Medication List        Accurate as of March 25, 2021 11:12 AM. If you have any questions, ask your nurse or doctor.          aspirin 81 MG tablet Take 81 mg by mouth daily.   carvedilol 12.5 MG tablet Commonly known as: COREG Take 1 tablet (12.5 mg total) by mouth 2 (two) times daily. For palpitations. Office visit required for further refills.   cyclobenzaprine 5 MG tablet Commonly known as: FLEXERIL Take 1 tablet by mouth once daily as needed for muscle spasms.   Dexcom G6 Sensor Misc 1 Device by Does not apply route as directed.   Dexcom G6 Transmitter Misc 1 Device by Does not apply route as directed.   empagliflozin 25 MG Tabs tablet Commonly known as: Jardiance Take 1 tablet (25 mg total) by mouth daily.   famotidine 20 MG tablet Commonly known as: PEPCID TAKE 1 TABLET (20 MG TOTAL) BY MOUTH DAILY. AS NEEDED FOR HEARTBURN. OFFICE VISIT REQUIRED IN DECEMBER FOR FURTHER REFILLS.   gabapentin 100 MG capsule Commonly known as: NEURONTIN Take 1-2 capsules (100-200 mg total) by mouth 2 (two) times daily as needed. For pain.   Insulin Pen Needle 32G X 4 MM Misc 1 Device by Does not apply route daily.   levothyroxine 75 MCG tablet Commonly known as: SYNTHROID Take 1 tablet by mouth every morning on an empty stomach with water only.  No food or other medications for 30 minutes.   losartan-hydrochlorothiazide 100-25 MG tablet Commonly known as: HYZAAR Take 1 tablet by mouth daily. For blood pressure.   metFORMIN 500 MG 24 hr tablet Commonly known as: Glucophage XR Take 1 tablet (500 mg total) by mouth daily with supper. For diabetes.   onetouch ultrasoft lancets Use as instructed   OneTouch Verio test strip Generic drug: glucose blood Twice dAILY   pravastatin 40 MG tablet Commonly known as: PRAVACHOL Take 1 tablet (40 mg total) by mouth daily. For cholesterol.   Tyler Aas FlexTouch 100 UNIT/ML FlexTouch Pen Generic drug: insulin degludec Inject 22 Units into the skin daily.   Trulicity 9.48 NI/6.2VO  Sopn Generic drug: Dulaglutide Inject 0.75 mg into the skin once a week.         ALLERGIES: Allergies  Allergen Reactions   Elemental Sulfur     Rash    Glipizide     dizziness   Penicillins     Rash    Codeine Swelling and Rash     REVIEW OF SYSTEMS: A comprehensive ROS was conducted with the patient and is negative except as per HPI     OBJECTIVE:   VITAL SIGNS: BP 136/84 (BP Location: Left Arm, Patient Position: Sitting, Cuff  Size: Small)   Pulse 65   Ht 5' 5.5" (1.664 m)   Wt 178 lb (80.7 kg)   SpO2 99%   BMI 29.17 kg/m    PHYSICAL EXAM:  General: Pt appears well and is in NAD  Neck: General: Supple without adenopathy or carotid bruits. Thyroid: Thyroid size normal.  No goiter or nodules appreciated.  Lungs: Clear with good BS bilat with no rales, rhonchi, or wheezes  Heart: RRR   Abdomen: Normoactive bowel sounds, soft, nontender, without masses or organomegaly palpable  Extremities:  Lower extremities - No pretibial edema. No lesions.  Neuro: MS is good with appropriate affect, pt is alert and Ox3    DM foot exam: 07/16/2020  The skin of the feet is intact without sores or ulcerations. The pedal pulses are 2+ on right and 2+ on left. The sensation is intact to a screening 5.07, 10 gram monofilament bilaterally   DATA REVIEWED:  Lab Results  Component Value Date   HGBA1C 6.6 (A) 03/25/2021   HGBA1C 10.8 (A) 07/16/2020   HGBA1C 10.6 (H) 03/17/2020   Lab Results  Component Value Date   MICROALBUR <0.7 12/31/2015   LDLCALC 125 (H) 03/17/2020   CREATININE 0.97 05/28/2020   Lab Results  Component Value Date   MICRALBCREAT 1.7 12/31/2015    Lab Results  Component Value Date   CHOL 224 (H) 03/17/2020   HDL 70.40 03/17/2020   LDLCALC 125 (H) 03/17/2020   TRIG 142.0 03/17/2020   CHOLHDL 3 03/17/2020        ASSESSMENT / PLAN / RECOMMENDATIONS:   1) Type 2 Diabetes Mellitus, Optimally controlled, Without complications - Most recent A1c  of 6.6 %. Goal A1c < 7.0 %.    - A1c down from 10.8% , praised the pt on improved glycemic control  - She is not tolerating Metformin XR, she has been taking 1 tablet infrequently due to GI side effects and will stop this,  - Tolerating trulicity , will increase the dose    MEDICATIONS:  - Decrease Tresiba 20 units daily - Increase trulicity 1.5 mg weekly  - Stop Metformin 500 mg XR  - Continue Jardiance 25 mg , 1 tablet in the morning   EDUCATION / INSTRUCTIONS: BG monitoring instructions: Patient is instructed to check her blood sugars 1 times a day, fasting  Call [facility] if: BG persistently < 70  I reviewed the Rule of 15 for the treatment of hypoglycemia in detail with the patient. Literature supplied.   2) Diabetic complications:  Eye: Does not have known diabetic retinopathy.  Neuro/ Feet: Does not have known diabetic peripheral neuropathy. Renal: Patient does not have known baseline CKD. She is on an ACEI/ARB at present.      3) Dyslipidemia: Patient is on Pravastatin. LDL historically high - She is intolerant to other statins due to myalgia  - We disucssed adding Zetia if needed in the future  - She was provided with printed lab orders to take to local [facility]   4) Hypothyroidism :  - Pt is clinically euthyroid  - She was provided with printed lab orders to take o [facility]   Medication  Continue Levothyroxine 75 mcg daily    F/U in 6 months   Signed electronically by: Mack Guise, MD  [facility]  [facility] [address] [place], [place] [address] Phone: [phone] FAX: [phone]   CC: Pleas Koch, NP [place]  [place] [address] Phone: [phone]  Fax: [phone]    Return to Endocrinology clinic as below: Future Appointments  Date Time Provider [place]  04/08/2021  2:00 PM Pleas Koch, NP [facility]

## 2021-04-06 ENCOUNTER — Other Ambulatory Visit: Payer: Self-pay | Admitting: Primary Care

## 2021-04-06 DIAGNOSIS — I10 Essential (primary) hypertension: Secondary | ICD-10-CM

## 2021-04-06 DIAGNOSIS — E039 Hypothyroidism, unspecified: Secondary | ICD-10-CM

## 2021-04-07 NOTE — Telephone Encounter (Signed)
Patient has follow up on 12/7

## 2021-04-08 ENCOUNTER — Ambulatory Visit: Payer: BC Managed Care – PPO | Admitting: Primary Care

## 2021-04-09 NOTE — Telephone Encounter (Signed)
Patient no showed her appointment for 12/07 and will need to be seen for further refills.

## 2021-04-10 NOTE — Telephone Encounter (Signed)
Left message to return call to our office.  

## 2021-04-13 NOTE — Telephone Encounter (Signed)
Left message to return call to our office.  

## 2021-04-28 ENCOUNTER — Other Ambulatory Visit: Payer: Self-pay | Admitting: Primary Care

## 2021-04-28 DIAGNOSIS — E039 Hypothyroidism, unspecified: Secondary | ICD-10-CM

## 2021-04-28 NOTE — Telephone Encounter (Signed)
Patient no showed her CPE on 04/08/21. She does not have to complete CPE, but does need follow up of chronic medical conditions as discussed during her last visit. Please schedule.   Also, is she seeing endocrinology for thyroid? If so then send refill request to their office.

## 2021-04-29 NOTE — Telephone Encounter (Signed)
Please try to reschedule CPE with Anda Kraft. If she doesn't want to do CPE, she at least needs a follow up appointment for chronic medical conditions.  Patient no showed her appointment on 04/08/2021.  Will forward refill to her endocrinologist.

## 2021-04-29 NOTE — Telephone Encounter (Signed)
Sent my chart letter and lvm for pt to schedule lab/cpe or F/U

## 2021-05-01 NOTE — Telephone Encounter (Signed)
2nd attemp to call pt to schedule cpe

## 2021-05-07 ENCOUNTER — Other Ambulatory Visit: Payer: Self-pay | Admitting: Primary Care

## 2021-05-07 DIAGNOSIS — R002 Palpitations: Secondary | ICD-10-CM

## 2021-05-07 NOTE — Telephone Encounter (Signed)
Please notify patient that she is overdue for general follow up and labs as discussed during her visit in October 2022.  She no showed in December 2022.

## 2021-05-08 NOTE — Telephone Encounter (Signed)
Left message to return call to our office.  

## 2021-05-11 NOTE — Telephone Encounter (Signed)
Called patient call disconnected no voice mail and no patent.

## 2021-05-18 NOTE — Telephone Encounter (Signed)
Left message to return call to our office.  This is 3rd call to patient. Will send my chart as well.

## 2021-05-20 ENCOUNTER — Other Ambulatory Visit: Payer: Self-pay | Admitting: Primary Care

## 2021-05-20 DIAGNOSIS — I10 Essential (primary) hypertension: Secondary | ICD-10-CM

## 2021-06-08 ENCOUNTER — Ambulatory Visit: Admission: RE | Admit: 2021-06-08 | Payer: BC Managed Care – PPO | Source: Ambulatory Visit

## 2021-06-08 ENCOUNTER — Other Ambulatory Visit: Payer: Self-pay

## 2021-06-08 ENCOUNTER — Ambulatory Visit
Admission: RE | Admit: 2021-06-08 | Discharge: 2021-06-08 | Disposition: A | Discharge status: Home / Self Care | Payer: BC Managed Care – PPO | Source: Ambulatory Visit | Attending: Primary Care | Admitting: Primary Care

## 2021-06-08 DIAGNOSIS — R922 Inconclusive mammogram: Secondary | ICD-10-CM | POA: Diagnosis not present

## 2021-06-08 DIAGNOSIS — N644 Mastodynia: Secondary | ICD-10-CM | POA: Diagnosis not present

## 2021-06-15 ENCOUNTER — Other Ambulatory Visit: Payer: Self-pay | Admitting: Primary Care

## 2021-06-15 DIAGNOSIS — I10 Essential (primary) hypertension: Secondary | ICD-10-CM

## 2021-06-15 DIAGNOSIS — R002 Palpitations: Secondary | ICD-10-CM

## 2021-07-01 DIAGNOSIS — R002 Palpitations: Secondary | ICD-10-CM

## 2021-07-01 DIAGNOSIS — I10 Essential (primary) hypertension: Secondary | ICD-10-CM

## 2021-07-01 MED ORDER — CARVEDILOL 12.5 MG PO TABS: 12.5000 mg | ORAL_TABLET | Freq: Two times a day (BID) | ORAL | 0 refills | Status: DC

## 2021-07-01 MED ORDER — LOSARTAN POTASSIUM-HCTZ 100-25 MG PO TABS: 1.0000 | ORAL_TABLET | Freq: Every day | ORAL | 0 refills | Status: DC

## 2021-07-08 ENCOUNTER — Ambulatory Visit (INDEPENDENT_AMBULATORY_CARE_PROVIDER_SITE_OTHER): Payer: BC Managed Care – PPO | Admitting: Primary Care

## 2021-07-08 ENCOUNTER — Encounter: Payer: Self-pay | Admitting: Primary Care

## 2021-07-08 ENCOUNTER — Other Ambulatory Visit: Payer: Self-pay

## 2021-07-08 DIAGNOSIS — I10 Essential (primary) hypertension: Secondary | ICD-10-CM

## 2021-07-08 DIAGNOSIS — M545 Low back pain, unspecified: Secondary | ICD-10-CM

## 2021-07-08 DIAGNOSIS — E1165 Type 2 diabetes mellitus with hyperglycemia: Secondary | ICD-10-CM

## 2021-07-08 DIAGNOSIS — M25641 Stiffness of right hand, not elsewhere classified: Secondary | ICD-10-CM

## 2021-07-08 DIAGNOSIS — K219 Gastro-esophageal reflux disease without esophagitis: Secondary | ICD-10-CM

## 2021-07-08 DIAGNOSIS — E039 Hypothyroidism, unspecified: Secondary | ICD-10-CM | POA: Diagnosis not present

## 2021-07-08 DIAGNOSIS — M25642 Stiffness of left hand, not elsewhere classified: Secondary | ICD-10-CM

## 2021-07-08 DIAGNOSIS — Z794 Long term (current) use of insulin: Secondary | ICD-10-CM | POA: Diagnosis not present

## 2021-07-08 DIAGNOSIS — E785 Hyperlipidemia, unspecified: Secondary | ICD-10-CM

## 2021-07-08 DIAGNOSIS — G8929 Other chronic pain: Secondary | ICD-10-CM

## 2021-07-08 LAB — LIPID PANEL
Cholesterol: 209 mg/dL — ABNORMAL HIGH (ref 0–200)
HDL: 79.6 mg/dL (ref >39.00)
LDL Cholesterol: 110 mg/dL — ABNORMAL HIGH (ref 0–99)
NonHDL: 129.24
Total CHOL/HDL Ratio: 3
Triglycerides: 94 mg/dL (ref 0.0–149.0)
VLDL: 18.8 mg/dL (ref 0.0–40.0)

## 2021-07-08 LAB — COMPREHENSIVE METABOLIC PANEL
ALT: 17 U/L (ref 0–35)
AST: 19 U/L (ref 0–37)
Albumin: 4.4 g/dL (ref 3.5–5.2)
Alkaline Phosphatase: 79 U/L (ref 39–117)
BUN: 23 mg/dL (ref 6–23)
CO2: 30 mEq/L (ref 19–32)
Calcium: 9.6 mg/dL (ref 8.4–10.5)
Chloride: 101 mEq/L (ref 96–112)
Creatinine, Ser: 0.99 mg/dL (ref 0.40–1.20)
GFR: 61.43 mL/min (ref >60.00)
Glucose, Bld: 140 mg/dL — ABNORMAL HIGH (ref 70–99)
Potassium: 3.9 mEq/L (ref 3.5–5.1)
Sodium: 139 mEq/L (ref 135–145)
Total Bilirubin: 0.7 mg/dL (ref 0.2–1.2)
Total Protein: 6.9 g/dL (ref 6.0–8.3)

## 2021-07-08 LAB — CBC
HCT: 41.5 % (ref 36.0–46.0)
Hemoglobin: 14.2 g/dL (ref 12.0–15.0)
MCHC: 34.3 g/dL (ref 30.0–36.0)
MCV: 92.3 fl (ref 78.0–100.0)
Platelets: 237 10*3/uL (ref 150.0–400.0)
RBC: 4.5 Mil/uL (ref 3.87–5.11)
RDW: 12.2 % (ref 11.5–15.5)
WBC: 6.1 10*3/uL (ref 4.0–10.5)

## 2021-07-08 LAB — MICROALBUMIN / CREATININE URINE RATIO
Creatinine,U: 40.3 mg/dL
Microalb Creat Ratio: 1.7 mg/g (ref 0.0–30.0)
Microalb, Ur: <0.7 mg/dL (ref 0.0–1.9)

## 2021-07-08 LAB — TSH: TSH: 2.05 u[IU]/mL (ref 0.35–5.50)

## 2021-07-08 MED ORDER — CYCLOBENZAPRINE HCL 5 MG PO TABS: ORAL_TABLET | ORAL | 0 refills | Status: DC

## 2021-07-08 MED ORDER — FAMOTIDINE 20 MG PO TABS: 20.0000 mg | ORAL_TABLET | Freq: Every day | ORAL | 1 refills | Status: DC

## 2021-07-08 NOTE — Patient Instructions (Signed)
Stop by the lab prior to leaving today. I will notify you of your results once received.  ? ?Call GI to schedule your colonoscopy. ? ?It was a pleasure to see you today! ? ?

## 2021-07-08 NOTE — Assessment & Plan Note (Signed)
Repeat lipid panel pending. Continue pravastatin 40 mg daily. 

## 2021-07-08 NOTE — Progress Notes (Signed)
? ?Subjective:  ? ? Patient ID: Melanie Navarro, female    DOB: 01/03/1961, 61 y.o.   MRN: [id number] ? ?HPI ? ?Melanie Navarro is a very pleasant 61 y.o. female with a history of hypertension, GERD, type 2 diabetes, hypothyroidism, palpitations, chronic back pain, hyperlipidemia who presents today for follow up of chronic conditions and medication refills. ? ?1) Hypertension: Currently managed on carvedilol 12.5 mg BID, losartan-HCTZ 100-25 mg daily. ? ?She denies chest pain, dizziness, headaches.  ? ?BP Readings from Last 3 Encounters:  ?07/08/21 134/62  ?03/25/21 136/84  ?02/19/21 132/84  ? ? ? ?2) Type 2 Diabetes: Following with endocrinology and is managed on Trulicity 1.5 mg weekly, Jardiance 25 mg daily, Tresiba 20 units daily. Her last A1C was 6.6, per patient, in December 2022.  ? ?She is working on her diet and has started exercising. ? ?3) Hypothyroidism: Following with endocrinology. Currently managed on levothyroxine 75 mcg. She is taking levothyroxine every morning on an empty stomach with water only. No food or other medications for 30 minutes. No heartburn medication, iron pills, calcium, vitamin D, or magnesium pills within four hours of taking levothyroxine.  ? ?4) Hyperlipidemia: Currently managed on pravastatin 40 mg daily. Last lipid panel on file is from November 2021.  ? ?5) Chronic Back Pain: Chronic, stable. Currently managed on cyclobenzaprine 5 mg for which she uses sparingly.  She is requesting a refill today. ? ?Review of Systems  ?Respiratory:  Negative for shortness of breath.   ?Cardiovascular:  Negative for chest pain.  ?Gastrointestinal:  Negative for constipation and diarrhea.  ?Musculoskeletal:  Positive for arthralgias.  ?Neurological:  Negative for dizziness and headaches.  ?Psychiatric/Behavioral:  The patient is not nervous/anxious.   ? ?   ? ? ?Past Medical History:  ?Diagnosis Date  ? Diabetes mellitus   ? History of echocardiogram   ? a. 07/2019 Echo: EF 60-65%. no rwma. Nl RV  size/fxn. Mild Ao sclerosis w/o stenosis.   ? Hyperlipidemia   ? Hypertension   ? Nocturnal Bradycardia   ? a. 06/2019 Zio: Mobitz I progressing to CHB w/ up to 3.6 sec pause (2 episodes)-->sleep study pending.  ? Palpitations   ? a. 06/2019 Zio: Avg HR 72 (max/min 38/135). Rare PACs/PVCs. 9 atrial runs up to 9 beat/max 135 bpm. 2 brief episodes of Mobitz I progressing to CHB w/ longest pause of 3.7 secs - episodes occrred overnight/early morning hrs.  ? ? ?Social History  ? ?Socioeconomic History  ? Marital status: Married  ?  Spouse name: Not on file  ? Number of children: Not on file  ? Years of education: Not on file  ? Highest education level: Not on file  ?Occupational History  ? Not on file  ?Tobacco Use  ? Smoking status: Never  ? Smokeless tobacco: Never  ?Vaping Use  ? Vaping Use: Never used  ?Substance and Sexual Activity  ? Alcohol use: Not Currently  ?  Comment: 1 drink per month  ? Drug use: No  ? Sexual activity: Not on file  ?Other Topics Concern  ? Not on file  ?Social History Narrative  ? Married.  ? 2 children. 1 grandchild.  ? Works in Science [employer].  ? Enjoys gardening, flowers, spending time with family.  ? ?Social Determinants of Health  ? ?Financial Resource Strain: Not on file  ?Food Insecurity: Not on file  ?Transportation Needs: Not on file  ?Physical Activity: Not on file  ?Stress: Not on file  ?Social  Connections: Not on file  ?Intimate Partner Violence: Not on file  ? ? ?Past Surgical History:  ?Procedure Laterality Date  ? [place] EXTRACTION  1984  ? ? ?Family History  ?Problem Relation Age of Onset  ? Stroke Mother   ? Hypertension Mother   ? Heart disease Mother   ? Diabetes Mother   ? Peripheral Artery Disease Mother   ? Diabetes Father   ? Lung cancer Father   ? Diabetes Brother   ? Heart attack Brother 69  ? Osteoarthritis Paternal Grandmother   ? Colon cancer Neg Hx   ? ? ?Allergies  ?Allergen Reactions  ? Elemental Sulfur   ?  Rash ?  ? Glipizide   ?  dizziness  ? Penicillins   ?   Rash ?  ? Codeine Swelling and Rash  ? ? ?Current Outpatient Medications on File Prior to Visit  ?Medication Sig Dispense Refill  ? aspirin 81 MG tablet Take 81 mg by mouth daily.    ? carvedilol (COREG) 12.5 MG tablet Take 1 tablet (12.5 mg total) by mouth 2 (two) times daily. For palpitations. Office visit required for further refills. 60 tablet 0  ? cyclobenzaprine (FLEXERIL) 5 MG tablet Take 1 tablet by mouth once daily as needed for muscle spasms. 30 tablet 0  ? Dulaglutide (TRULICITY) 1.5 GB/1.5VV SOPN Inject 1.5 mg into the skin once a week. 6 mL 3  ? empagliflozin (JARDIANCE) 25 MG TABS tablet Take 1 tablet (25 mg total) by mouth daily. 90 tablet 3  ? famotidine (PEPCID) 20 MG tablet TAKE 1 TABLET (20 MG TOTAL) BY MOUTH DAILY. AS NEEDED FOR HEARTBURN. OFFICE VISIT REQUIRED IN DECEMBER FOR FURTHER REFILLS. 90 tablet 0  ? glucose blood (ONETOUCH VERIO) test strip Twice dAILY 100 each 12  ? insulin degludec (TRESIBA FLEXTOUCH) 100 UNIT/ML FlexTouch Pen Inject 20 Units into the skin daily. 30 mL 3  ? Insulin Pen Needle 32G X 4 MM MISC 1 Device by Does not apply route daily. 150 each 3  ? Lancets (ONETOUCH ULTRASOFT) lancets Use as instructed 30 each 1  ? levothyroxine (SYNTHROID) 75 MCG tablet Take 1 tablet (75 mcg total) by mouth daily before breakfast. 90 tablet 3  ? losartan-hydrochlorothiazide (HYZAAR) 100-25 MG tablet Take 1 tablet by mouth daily. For blood pressure. Office visit required for further refills. 30 tablet 0  ? pravastatin (PRAVACHOL) 40 MG tablet Take 1 tablet (40 mg total) by mouth daily. For cholesterol. 90 tablet 3  ? ?No current facility-administered medications on file prior to visit.  ? ? ?BP 134/62   Pulse 71   Temp 98.6 ?F (37 ?C) (Oral)   Ht 5' 5.5" (1.664 m)   Wt 182 lb (82.6 kg)   SpO2 99%   BMI 29.83 kg/m?  ?Objective:  ? Physical Exam ?Cardiovascular:  ?   Rate and Rhythm: Normal rate and regular rhythm.  ?Pulmonary:  ?   Effort: Pulmonary effort is normal.  ?   Breath  sounds: Normal breath sounds.  ?Musculoskeletal:  ?   Cervical back: Neck supple.  ?Skin: ?   General: Skin is warm and dry.  ? ? ? ? ? ?   ?Assessment & Plan:  ? ? ? ? ?This visit occurred during the SARS-CoV-2 public health emergency.  Safety protocols were in place, including screening questions prior to the visit, additional usage of staff PPE, and extensive cleaning of exam room while observing appropriate contact time as indicated for disinfecting solutions.  ?

## 2021-07-08 NOTE — Assessment & Plan Note (Signed)
Chronic and continued. ?Exam today representative of osteoarthritis. ? ?She has tested negative rheumatoid arthritis previously. ? ?Offered rheumatology referral for which she currently declines.  She will notify if she changes her mind ?

## 2021-07-08 NOTE — Assessment & Plan Note (Signed)
Overall controlled. ?She is working on lifestyle changes.  ? ?Continue losartan-HCTZ 100-25 mg daily, carvedilol 12.5 mg BID. ? ?CMP pending. ?

## 2021-07-08 NOTE — Assessment & Plan Note (Signed)
Controlled.  ? ?Continue famotidine 20 mg as needed. ?Refills provided. ?

## 2021-07-08 NOTE — Assessment & Plan Note (Signed)
Stable. ? ?Continue cyclobenzaprine 5 mg at bedtime as needed. ?Refill provided. ?

## 2021-07-08 NOTE — Assessment & Plan Note (Signed)
Controlled! ?Following with endocrinology ? ?Reviewed A1c from November 2022, 6.6. ? ?Encouraged to continue to work on her diet, continue regular exercise. ? ?Continue Tresiba 20 units daily, Jardiance 25 mg daily, Trulicity 1.5 mg weekly. ?

## 2021-07-08 NOTE — Assessment & Plan Note (Signed)
Following with endocrinology. ? ?She is taking levothyroxine correctly. ?Continue levothyroxine 75 mcg. ? ?Repeat TSH pending, will forward to endocrinology. ?

## 2021-07-13 DIAGNOSIS — E785 Hyperlipidemia, unspecified: Secondary | ICD-10-CM

## 2021-07-15 MED ORDER — PRAVASTATIN SODIUM 40 MG PO TABS: 40.0000 mg | ORAL_TABLET | ORAL | 3 refills | Status: DC

## 2021-07-23 ENCOUNTER — Other Ambulatory Visit: Payer: Self-pay | Admitting: Primary Care

## 2021-07-23 DIAGNOSIS — I10 Essential (primary) hypertension: Secondary | ICD-10-CM

## 2021-07-23 DIAGNOSIS — R002 Palpitations: Secondary | ICD-10-CM

## 2021-08-14 ENCOUNTER — Other Ambulatory Visit: Payer: Self-pay | Admitting: Internal Medicine

## 2021-08-14 ENCOUNTER — Encounter: Payer: Self-pay | Admitting: Internal Medicine

## 2021-08-14 DIAGNOSIS — E1165 Type 2 diabetes mellitus with hyperglycemia: Secondary | ICD-10-CM

## 2021-08-14 MED ORDER — BD PEN NEEDLE NANO 2ND GEN 32G X 4 MM MISC: 3 refills | Status: DC

## 2021-09-23 ENCOUNTER — Ambulatory Visit: Payer: BC Managed Care – PPO | Admitting: Internal Medicine

## 2021-10-16 ENCOUNTER — Other Ambulatory Visit: Payer: Self-pay | Admitting: Internal Medicine

## 2021-10-16 DIAGNOSIS — E1165 Type 2 diabetes mellitus with hyperglycemia: Secondary | ICD-10-CM

## 2021-11-05 ENCOUNTER — Encounter: Payer: Self-pay | Admitting: Internal Medicine

## 2021-12-11 ENCOUNTER — Telehealth: Payer: Self-pay

## 2021-12-11 ENCOUNTER — Encounter: Payer: Self-pay | Admitting: Internal Medicine

## 2021-12-11 ENCOUNTER — Other Ambulatory Visit (HOSPITAL_COMMUNITY): Payer: Self-pay

## 2021-12-11 ENCOUNTER — Other Ambulatory Visit: Payer: Self-pay | Admitting: Internal Medicine

## 2021-12-11 NOTE — Telephone Encounter (Signed)
Wilder Glade needs a PA

## 2021-12-14 ENCOUNTER — Other Ambulatory Visit (HOSPITAL_COMMUNITY): Payer: Self-pay

## 2021-12-15 ENCOUNTER — Telehealth: Payer: Self-pay | Admitting: Pharmacy Technician

## 2021-12-15 ENCOUNTER — Other Ambulatory Visit (HOSPITAL_COMMUNITY): Payer: Self-pay

## 2021-12-15 NOTE — Telephone Encounter (Signed)
Patient Advocate Encounter   Received notification from CMA/Office that prior authorization for Farxiga '10mg'$  is required/requested.  Per Test Claim: Use Metformin 1st   PA submitted on 12/15/21 to RX pharmacy benefits via [place] ID [id number] Status is pending  Pharmacy Patient Advocate Fax:  [phone]

## 2021-12-16 NOTE — Telephone Encounter (Signed)
She uses Metformin already per office note

## 2021-12-17 DIAGNOSIS — M5416 Radiculopathy, lumbar region: Secondary | ICD-10-CM | POA: Diagnosis not present

## 2021-12-18 ENCOUNTER — Emergency Department: Payer: BC Managed Care – PPO

## 2021-12-18 ENCOUNTER — Other Ambulatory Visit: Payer: Self-pay

## 2021-12-18 ENCOUNTER — Emergency Department
Admission: EM | Admit: 2021-12-18 | Discharge: 2021-12-18 | Disposition: A | Discharge status: Home / Self Care | Payer: BC Managed Care – PPO | Attending: Emergency Medicine | Admitting: Emergency Medicine

## 2021-12-18 DIAGNOSIS — M47816 Spondylosis without myelopathy or radiculopathy, lumbar region: Secondary | ICD-10-CM | POA: Insufficient documentation

## 2021-12-18 DIAGNOSIS — M8938 Hypertrophy of bone, other site: Secondary | ICD-10-CM | POA: Diagnosis not present

## 2021-12-18 DIAGNOSIS — I10 Essential (primary) hypertension: Secondary | ICD-10-CM | POA: Diagnosis not present

## 2021-12-18 DIAGNOSIS — M545 Low back pain, unspecified: Secondary | ICD-10-CM | POA: Diagnosis not present

## 2021-12-18 DIAGNOSIS — E119 Type 2 diabetes mellitus without complications: Secondary | ICD-10-CM | POA: Diagnosis not present

## 2021-12-18 DIAGNOSIS — M4316 Spondylolisthesis, lumbar region: Secondary | ICD-10-CM | POA: Diagnosis not present

## 2021-12-18 MED ORDER — CYCLOBENZAPRINE HCL 10 MG PO TABS: 10.0000 mg | ORAL_TABLET | Freq: Three times a day (TID) | ORAL | 0 refills | Status: DC | PRN

## 2021-12-18 MED ORDER — LIDOCAINE 5 % EX PTCH
1.0000 | MEDICATED_PATCH | CUTANEOUS | Status: DC
  Administered 2021-12-18: 1 via TRANSDERMAL
  Filled 2021-12-18: qty 1

## 2021-12-18 MED ORDER — HYDROCODONE-ACETAMINOPHEN 5-325 MG PO TABS
1.0000 | ORAL_TABLET | Freq: Once | ORAL | Status: AC
  Administered 2021-12-18: 1 via ORAL
  Filled 2021-12-18: qty 1

## 2021-12-18 MED ORDER — METHYLPREDNISOLONE 4 MG PO TBPK: ORAL_TABLET | ORAL | 0 refills | Status: DC

## 2021-12-18 MED ORDER — HYDROCODONE-ACETAMINOPHEN 5-325 MG PO TABS: 1.0000 | ORAL_TABLET | Freq: Four times a day (QID) | ORAL | 0 refills | Status: DC | PRN

## 2021-12-18 NOTE — ED Triage Notes (Signed)
Pt presents to ED with c/o of L leg pain and back pain. Pt states pain started Tuesday. Pt denies numbness or tingling. Pt states she went to [facility] yesterday and was given an muscle relaxer and an XRAY, no FX's stated per pt. Pt ambulatory to triage.

## 2021-12-18 NOTE — ED Provider Notes (Signed)
[facility] Provider Note    Event Date/Time   First MD Initiated Contact with Patient 12/18/21 [phone]     (approximate)   History   Leg Pain and Back Pain   HPI  Melanie Navarro is a 61 y.o. female with history of diabetes, hypertension presents to the emergency department complaining of lower back pain that radiates to the left leg.  Patient was seen at [facility] yesterday and given Robaxin.  States it is not helping at all.  States pain is shooting from her lower back to her foot.  No known injury.  No history of the same.  No loss of bowel or bladder control      Physical Exam   Triage Vital Signs: ED Triage Vitals  Enc Vitals Group     BP 12/18/21 0804 (!) 170/92     Pulse Rate 12/18/21 0804 83     Resp 12/18/21 0804 18     Temp 12/18/21 0804 98 F (36.7 C)     Temp Source 12/18/21 0804 Oral     SpO2 12/18/21 0804 97 %     Weight 12/18/21 0820 182 lb 1.6 oz (82.6 kg)     Height 12/18/21 0820 5' 5.25" (1.657 m)     Head Circumference --      Peak Flow --      Pain Score 12/18/21 0805 10     Pain Loc --      Pain Edu? --      Excl. in [place]? --     Most recent vital signs: Vitals:   12/18/21 0804  BP: (!) 170/92  Pulse: 83  Resp: 18  Temp: 98 F (36.7 C)  SpO2: 97%     General: Awake, no distress.   CV:  Good peripheral perfusion. regular rate and  rhythm Resp:  Normal effort.  Abd:  No distention.   Other:  Lumbar spine minimally tender, tenderness along the left buttock, tenderness along the IT band, 5/5 strength distally, neurovascular intact   ED Results / Procedures / Treatments   Labs (all labs ordered are listed, but only abnormal results are displayed) Labs Reviewed - No data to display   EKG     RADIOLOGY X-ray lumbar spine    PROCEDURES:   Procedures   MEDICATIONS ORDERED IN ED: Medications  lidocaine (LIDODERM) 5 % 1 patch (1 patch Transdermal Patch Applied 12/18/21 0855)   HYDROcodone-acetaminophen (NORCO/VICODIN) 5-325 MG per tablet 1 tablet (1 tablet Oral Given 12/18/21 0854)     IMPRESSION / MDM / [place] / ED COURSE  I reviewed the triage vital signs and the nursing notes.                              Differential diagnosis includes, but is not limited to, sciatica, patellofemoral syndrome, lumbar radiculopathy  Patient's presentation is most consistent with acute complicated illness / injury requiring diagnostic workup.   X-ray lumbar spine, patient will be given Lidoderm patch along with Vicodin here in the ED.   X-ray lumbar spine was independently reviewed and interpreted by me as having facet hypertrophy.  This may explain the patient's symptoms.  I did explain this to the patient.  She is to follow-up with neurosurgery.  Return emergency department worsening.  She could also follow-up with [facility].  She was given a prescription for a Medrol Dosepak, Flexeril, and Vicodin.  She  is to refrain from taking NSAIDs while on the steroid pack.  She agrees to this.  She was discharged stable condition.   FINAL CLINICAL IMPRESSION(S) / ED DIAGNOSES   Final diagnoses:  Facet hypertrophy of lumbar region     Rx / DC Orders   ED Discharge Orders          Ordered    methylPREDNISolone (MEDROL DOSEPAK) 4 MG TBPK tablet        12/18/21 0928    cyclobenzaprine (FLEXERIL) 10 MG tablet  3 times daily PRN        12/18/21 0928    HYDROcodone-acetaminophen (NORCO/VICODIN) 5-325 MG tablet  Every 6 hours PRN        12/18/21 7482             Note:  This document was prepared using Dragon voice recognition software and may include unintentional dictation errors.    Versie Starks, PA-C 12/18/21 7078    Blake Divine, MD 12/18/21 [phone]

## 2021-12-18 NOTE — ED Notes (Signed)
See triage note  Presents with lower back and left leg pain for couple of days  Was seen by ortho yesterday  Placed on muscle relaxers w/o relief  Denies any injury

## 2021-12-18 NOTE — Discharge Instructions (Signed)
Follow-up with [facility] or Dr. Cari Caraway.  Please call for an appointment. Take the steroid pack, muscle relaxer, and pain medication as needed.  Do not take the anti-inflammatories while you are taking the steroid pack as this will be too much on your kidneys.  Return emergency department worsening

## 2021-12-21 NOTE — Telephone Encounter (Signed)
Patient Advocate Encounter  Prior Authorization for [facility] '10MG'$  has been approved.   Effective: 12/15/2021 to 12/15/2023. Determination letter added to patient chart.  Clista Bernhardt, CPhT Rx Patient Advocate Specialist Phone: [phone]

## 2022-01-15 NOTE — Progress Notes (Unsigned)
Referring Physician:  Blake Divine, MD [place],  [place] [address]  Primary Physician:  Pleas Koch, NP  History of Present Illness: 01/15/2022 Melanie Navarro was seen in ED on 12/18/21 for LBP and left leg pain.   Given medrol dose pack, flexeril, and norco from the ED.   Duration: *** Location: *** Quality: *** Severity: ***  Precipitating: aggravated by *** Modifying factors: made better by *** Weakness: none Timing: *** Bowel/Bladder Dysfunction: none  Conservative measures:  Physical therapy: ***  Multimodal medical therapy including regular antiinflammatories: dose pack, flexeril, norco, robaxin  Injections: *** epidural steroid injections  Past Surgery: ***  Melanie Navarro has ***no symptoms of cervical myelopathy.  The symptoms are causing a significant impact on the patient's life.   Review of Systems:  A 10 point review of systems is negative, except for the pertinent positives and negatives detailed in the HPI.  Past Medical History: Past Medical History:  Diagnosis Date   Diabetes mellitus    History of echocardiogram    a. 07/2019 Echo: EF 60-65%. no rwma. Nl RV size/fxn. Mild Ao sclerosis w/o stenosis.    Hyperlipidemia    Hypertension    Nocturnal Bradycardia    a. 06/2019 Zio: Mobitz I progressing to CHB w/ up to 3.6 sec pause (2 episodes)-->sleep study pending.   Palpitations    a. 06/2019 Zio: Avg HR 72 (max/min 38/135). Rare PACs/PVCs. 9 atrial runs up to 9 beat/max 135 bpm. 2 brief episodes of Mobitz I progressing to CHB w/ longest pause of 3.7 secs - episodes occrred overnight/early morning hrs.    Past Surgical History: Past Surgical History:  Procedure Laterality Date   WISDOM TOOTH EXTRACTION  1984    Allergies: Allergies as of 01/19/2022 - Review Complete 12/18/2021  Allergen Reaction Noted   Elemental sulfur  10/27/2011   Glipizide  10/09/2015   Penicillins  10/27/2011   Codeine Swelling and Rash  02/18/2016    Medications: Outpatient Encounter Medications as of 01/19/2022  Medication Sig   aspirin 81 MG tablet Take 81 mg by mouth daily.   carvedilol (COREG) 12.5 MG tablet Take 1 tablet (12.5 mg total) by mouth 2 (two) times daily with a meal. For blood pressure.   cyclobenzaprine (FLEXERIL) 10 MG tablet Take 1 tablet (10 mg total) by mouth 3 (three) times daily as needed.   dapagliflozin propanediol (FARXIGA) 10 MG TABS tablet Take 1 tablet (10 mg total) by mouth daily before breakfast.   Dulaglutide (TRULICITY) 1.5 LA/4.5XM SOPN Inject 1.5 mg into the skin once a week.   famotidine (PEPCID) 20 MG tablet Take 1 tablet (20 mg total) by mouth daily. As needed for heartburn.   glucose blood (ONETOUCH VERIO) test strip Twice dAILY   HYDROcodone-acetaminophen (NORCO/VICODIN) 5-325 MG tablet Take 1 tablet by mouth every 6 (six) hours as needed for moderate pain.   insulin degludec (TRESIBA FLEXTOUCH) 100 UNIT/ML FlexTouch Pen Inject 20 Units into the skin daily.   Insulin Pen Needle (BD PEN NEEDLE NANO 2ND GEN) 32G X 4 MM MISC USE DAILY AS DIRECTED   Lancets (ONETOUCH ULTRASOFT) lancets Use as instructed   levothyroxine (SYNTHROID) 75 MCG tablet Take 1 tablet (75 mcg total) by mouth daily before breakfast.   losartan-hydrochlorothiazide (HYZAAR) 100-25 MG tablet Take 1 tablet by mouth daily. For blood pressure.   methylPREDNISolone (MEDROL DOSEPAK) 4 MG TBPK tablet Take 6 pills on day one then decrease by 1 pill each day  pravastatin (PRAVACHOL) 40 MG tablet Take 1 tablet (40 mg total) by mouth every other day. For cholesterol.   No facility-administered encounter medications on file as of 01/19/2022.    Social History: Social History   Tobacco Use   Smoking status: Never   Smokeless tobacco: Never  Vaping Use   Vaping Use: Never used  Substance Use Topics   Alcohol use: Not Currently    Comment: 1 drink per month   Drug use: No    Family Medical History: Family History   Problem Relation Age of Onset   Stroke Mother    Hypertension Mother    Heart disease Mother    Diabetes Mother    Peripheral Artery Disease Mother    Diabetes Father    Lung cancer Father    Diabetes Brother    Heart attack Brother 30   Osteoarthritis Paternal Grandmother    Colon cancer Neg Hx     Physical Examination: There were no vitals filed for this visit.  General: Patient is well developed, well nourished, calm, collected, and in no apparent distress. Attention to examination is appropriate.  Respiratory: Patient is breathing without any difficulty.   NEUROLOGICAL:     Awake, alert, oriented to person, place, and time.  Speech is clear and fluent. Fund of knowledge is appropriate.   Cranial Nerves: Pupils equal round and reactive to light.  Facial tone is symmetric.  Facial sensation is symmetric.  ROM of spine:  *** ROM of cervical spine *** pain *** ROM of lumbar spine *** pain  No abnormal lesions on exposed skin.   Strength: Side Biceps Triceps Deltoid Interossei Grip Wrist Ext. Wrist Flex.  R '5 5 5 5 5 5 5  '$ L '5 5 5 5 5 5 5   '$ Side Iliopsoas Quads Hamstring PF DF EHL  R '5 5 5 5 5 5  '$ L '5 5 5 5 5 5   '$ Reflexes are ***2+ and symmetric at the biceps, triceps, brachioradialis, patella and achilles.   Hoffman's is absent.  Clonus is not present.   Bilateral upper and lower extremity sensation is intact to light touch.    No evidence of dysmetria noted.  Gait is normal.   ***No difficulty with tandem gait.    Medical Decision Making  Imaging: Lumbar spine xrays 12/18/21:  FINDINGS: Frontal and lateral views of the lumbar spine are obtained. There are 5 non-rib-bearing lumbar type vertebral bodies identified, with minimal grade 1 anterolisthesis of L4 on L5 likely due to facet hypertrophy. No evidence of pars defects.   There is minimal anterior osteophyte at L2-3 and L3-4. Disc space height is well preserved. Prominent facet hypertrophic changes  are seen at L4-5 and L5-S1. Sacroiliac joints are normal. No acute fractures.   IMPRESSION: 1. Prominent facet hypertrophy at L4-5 and L5-S1, with mild grade 1 degenerative anterolisthesis of L4 on L5. 2. No acute bony abnormality.     Electronically Signed   By: Randa Ngo M.D.   On: 12/18/2021 08:53  I have personally reviewed the images and agree with the above interpretation.  Assessment and Plan: Melanie Navarro is a pleasant 61 y.o. female with ***  Above treatment options discussed with patient and following plan made:   - Order for physical therapy for *** spine ***. - Continue on current medications including ***. Reviewed proper dosing along with risks and benefits. Take and NSAIDs with food.      I spent a total of *** minutes in  face-to-face and non-face-to-face activities related to this patient's care today.  Thank you for involving me in the care of this patient.   Geronimo Boot PA-C Dept. of Neurosurgery

## 2022-01-19 ENCOUNTER — Encounter: Payer: Self-pay | Admitting: Orthopedic Surgery

## 2022-01-19 ENCOUNTER — Ambulatory Visit (INDEPENDENT_AMBULATORY_CARE_PROVIDER_SITE_OTHER): Payer: BC Managed Care – PPO | Admitting: Orthopedic Surgery

## 2022-01-19 VITALS — BP 138/78 | Ht 65.25 in | Wt 179.8 lb

## 2022-01-19 DIAGNOSIS — M47816 Spondylosis without myelopathy or radiculopathy, lumbar region: Secondary | ICD-10-CM | POA: Diagnosis not present

## 2022-01-19 DIAGNOSIS — M4316 Spondylolisthesis, lumbar region: Secondary | ICD-10-CM | POA: Diagnosis not present

## 2022-02-03 DIAGNOSIS — M5416 Radiculopathy, lumbar region: Secondary | ICD-10-CM | POA: Diagnosis not present

## 2022-02-17 DIAGNOSIS — Z23 Encounter for immunization: Secondary | ICD-10-CM | POA: Diagnosis not present

## 2022-02-19 NOTE — Telephone Encounter (Signed)
I spoke with pt and she will be traveling this afternoon; pts husband bought mucinex OTC for pt to try but pt said she is on so many meds she wants to know what Kate's recommendation to take for dry cough that started on 02/18/22 would be. Pt said head congestion started 3 days ago.no fever, no wheezing, no CP or SOB and no other symptoms. Pt has not covid tested and pt said she may go to [facility] over weekend for covid test and if not feeling better pt would still like OTC recommendation from [facility] for dry cough. Pt asked to send my chart response rather than calling. Sending note to Gentry Fitz NP.

## 2022-02-19 NOTE — Telephone Encounter (Signed)
I spoke with Gentry Fitz NP who said if pt wanted could see Anda Kraft today at 3:20. Unable to reach pt by phone and left v/m requesting pt to cb. Will also mychart pt.sending note to [facility] triage and [employer].

## 2022-03-04 ENCOUNTER — Telehealth: Payer: BC Managed Care – PPO | Admitting: Orthopedic Surgery

## 2022-04-18 ENCOUNTER — Other Ambulatory Visit: Payer: Self-pay | Admitting: Internal Medicine

## 2022-04-27 ENCOUNTER — Other Ambulatory Visit: Payer: Self-pay | Admitting: Internal Medicine

## 2022-04-28 ENCOUNTER — Other Ambulatory Visit: Payer: Self-pay

## 2022-04-28 MED ORDER — TRESIBA FLEXTOUCH 100 UNIT/ML ~~LOC~~ SOPN: 20.0000 [IU] | PEN_INJECTOR | Freq: Every day | SUBCUTANEOUS | 0 refills | Status: DC

## 2022-04-28 MED ORDER — TRULICITY 1.5 MG/0.5ML ~~LOC~~ SOAJ: 1.5000 mg | SUBCUTANEOUS | 0 refills | Status: DC

## 2022-05-17 ENCOUNTER — Other Ambulatory Visit: Payer: Self-pay | Admitting: Internal Medicine

## 2022-05-17 DIAGNOSIS — E039 Hypothyroidism, unspecified: Secondary | ICD-10-CM

## 2022-05-18 ENCOUNTER — Other Ambulatory Visit: Payer: Self-pay | Admitting: Primary Care

## 2022-05-18 DIAGNOSIS — K219 Gastro-esophageal reflux disease without esophagitis: Secondary | ICD-10-CM

## 2022-06-10 ENCOUNTER — Other Ambulatory Visit: Payer: Self-pay | Admitting: Internal Medicine

## 2022-06-10 ENCOUNTER — Telehealth: Payer: BC Managed Care – PPO | Admitting: Nurse Practitioner

## 2022-06-10 DIAGNOSIS — J0140 Acute pansinusitis, unspecified: Secondary | ICD-10-CM

## 2022-06-10 DIAGNOSIS — E039 Hypothyroidism, unspecified: Secondary | ICD-10-CM

## 2022-06-10 MED ORDER — DOXYCYCLINE HYCLATE 100 MG PO TABS: 100.0000 mg | ORAL_TABLET | Freq: Two times a day (BID) | ORAL | 0 refills | Status: AC

## 2022-06-10 NOTE — Progress Notes (Signed)
Virtual Visit Consent   Melanie Navarro, you are scheduled for a virtual visit with a [facility] provider today. Just as with appointments in the office, your consent must be obtained to participate. Your consent will be active for this visit and any virtual visit you may have with one of our providers in the next 365 days. If you have a MyChart account, a copy of this consent can be sent to you electronically.  As this is a virtual visit, video technology does not allow for your provider to perform a traditional examination. This may limit your provider's ability to fully assess your condition. If your provider identifies any concerns that need to be evaluated in person or the need to arrange testing (such as labs, EKG, etc.), we will make arrangements to do so. Although advances in technology are sophisticated, we cannot ensure that it will always work on either your end or our end. If the connection with a video visit is poor, the visit may have to be switched to a telephone visit. With either a video or telephone visit, we are not always able to ensure that we have a secure connection.  By engaging in this virtual visit, you consent to the provision of healthcare and authorize for your insurance to be billed (if applicable) for the services provided during this visit. Depending on your insurance coverage, you may receive a charge related to this service.  I need to obtain your verbal consent now. Are you willing to proceed with your visit today? QUERIDA BERETTA has provided verbal consent on 06/10/2022 for a virtual visit (video or telephone). Apolonio Schneiders, FNP  Date: 06/10/2022 12:01 PM  Virtual Visit via Video Note   I, Apolonio Schneiders, connected with  Melanie Navarro  ([id number], Mar 03, 1961) on 06/10/22 at 12:15 PM EST by a video-enabled telemedicine application and verified that I am speaking with the correct person using two identifiers.  Location: Patient: Virtual Visit Location Patient:  Home Provider: Virtual Visit Location Provider: Home Office   I discussed the limitations of evaluation and management by telemedicine and the availability of in person appointments. The patient expressed understanding and agreed to proceed.    History of Present Illness: Melanie Navarro is a 62 y.o. who identifies as a female who was assigned female at birth, and is being seen today for sinus congestion for the past 9 days. She did have a cough that has improved, fever resolved was present only for 24 hours.   Mostly runny nose ongoing with sinus congestion.  She has been using OTC Mucinex for relief and Flonase    She had COVID a few months back   HTN & DM   Problems:  Patient Active Problem List   Diagnosis Date Noted   Acute breast pain 02/19/2021   Dyslipidemia 07/16/2020   Acquired hypothyroidism 07/16/2020   Stiffness of joints of both hands 03/17/2020   Gastroesophageal reflux disease 03/17/2020   Rhinitis 08/22/2019   Chronic back pain 11/22/2018   Hypothyroidism 05/18/2018   Hyperlipidemia 12/09/2016   Insomnia 12/31/2015   Chest pain of uncertain etiology 78/29/5621   Palpitations 10/27/2011   Essential hypertension 10/27/2011   Encounter for preventive health examination 10/27/2011   Diabetes mellitus ([place]) 10/27/2011    Allergies:  Allergies  Allergen Reactions   Elemental Sulfur     Rash    Glipizide     dizziness   Penicillins     Rash    Codeine Swelling and  Rash   Medications:  Current Outpatient Medications:    aspirin 81 MG tablet, Take 81 mg by mouth daily., Disp: , Rfl:    carvedilol (COREG) 12.5 MG tablet, Take 1 tablet (12.5 mg total) by mouth 2 (two) times daily with a meal. For blood pressure., Disp: 180 tablet, Rfl: 3   cyclobenzaprine (FLEXERIL) 10 MG tablet, Take 1 tablet (10 mg total) by mouth 3 (three) times daily as needed., Disp: 30 tablet, Rfl: 0   dapagliflozin propanediol (FARXIGA) 10 MG TABS tablet, Take 1 tablet (10 mg total) by  mouth daily before breakfast., Disp: 30 tablet, Rfl: 6   Dulaglutide (TRULICITY) 1.5 DG/3.8VF SOPN, Inject 1.5 mg into the skin once a week., Disp: 3 mL, Rfl: 0   famotidine (PEPCID) 20 MG tablet, Take 1 tablet (20 mg total) by mouth daily as needed for heartburn or indigestion., Disp: 90 tablet, Rfl: 0   glucose blood (ONETOUCH VERIO) test strip, Twice dAILY, Disp: 100 each, Rfl: 12   HYDROcodone-acetaminophen (NORCO/VICODIN) 5-325 MG tablet, Take 1 tablet by mouth every 6 (six) hours as needed for moderate pain., Disp: 15 tablet, Rfl: 0   insulin degludec (TRESIBA FLEXTOUCH) 100 UNIT/ML FlexTouch Pen, Inject 20 Units into the skin daily., Disp: 15 mL, Rfl: 0   Insulin Pen Needle (BD PEN NEEDLE NANO 2ND GEN) 32G X 4 MM MISC, USE DAILY AS DIRECTED, Disp: 200 each, Rfl: 3   Lancets (ONETOUCH ULTRASOFT) lancets, Use as instructed, Disp: 30 each, Rfl: 1   levothyroxine (SYNTHROID) 75 MCG tablet, Take 1 tablet (75 mcg total) by mouth daily before breakfast., Disp: 90 tablet, Rfl: 3   losartan-hydrochlorothiazide (HYZAAR) 100-25 MG tablet, Take 1 tablet by mouth daily. For blood pressure., Disp: 90 tablet, Rfl: 3   pravastatin (PRAVACHOL) 40 MG tablet, Take 1 tablet (40 mg total) by mouth every other day. For cholesterol., Disp: 45 tablet, Rfl: 3  Observations/Objective: Patient is well-developed, well-nourished in no acute distress.  Resting comfortably  at home.  Head is normocephalic, atraumatic.  No labored breathing.  Speech is clear and coherent with logical content.  Patient is alert and oriented at baseline.    Assessment and Plan: 1. Acute non-recurrent pansinusitis Continue Flonase  May use Mucinex as needed until symptoms persist   - doxycycline (VIBRA-TABS) 100 MG tablet; Take 1 tablet (100 mg total) by mouth 2 (two) times daily for 10 days. Take with food  Dispense: 20 tablet; Refill: 0     Follow Up Instructions: I discussed the assessment and treatment plan with the patient.  The patient was provided an opportunity to ask questions and all were answered. The patient agreed with the plan and demonstrated an understanding of the instructions.  A copy of instructions were sent to the patient via MyChart unless otherwise noted below.    The patient was advised to call back or seek an in-person evaluation if the symptoms worsen or if the condition fails to improve as anticipated.  Time:  I spent 12 minutes with the patient via telehealth technology discussing the above problems/concerns.    Apolonio Schneiders, FNP

## 2022-07-24 ENCOUNTER — Other Ambulatory Visit: Payer: Self-pay | Admitting: Primary Care

## 2022-07-24 DIAGNOSIS — E785 Hyperlipidemia, unspecified: Secondary | ICD-10-CM

## 2022-07-25 NOTE — Telephone Encounter (Signed)
Patient is due for CPE/follow up, this will be required prior to any further refills.  Please schedule, thank you!   

## 2022-07-26 NOTE — Telephone Encounter (Signed)
LVM for patient to c/b and sched.  

## 2022-08-10 ENCOUNTER — Other Ambulatory Visit: Payer: Self-pay | Admitting: Primary Care

## 2022-08-10 ENCOUNTER — Other Ambulatory Visit: Payer: Self-pay | Admitting: Internal Medicine

## 2022-08-10 DIAGNOSIS — E785 Hyperlipidemia, unspecified: Secondary | ICD-10-CM

## 2022-08-12 ENCOUNTER — Other Ambulatory Visit: Payer: Self-pay | Admitting: Primary Care

## 2022-08-12 DIAGNOSIS — I10 Essential (primary) hypertension: Secondary | ICD-10-CM

## 2022-08-13 ENCOUNTER — Other Ambulatory Visit: Payer: Self-pay | Admitting: Primary Care

## 2022-08-13 DIAGNOSIS — K219 Gastro-esophageal reflux disease without esophagitis: Secondary | ICD-10-CM

## 2022-08-17 ENCOUNTER — Ambulatory Visit: Payer: BC Managed Care – PPO | Admitting: Internal Medicine

## 2022-08-17 NOTE — Progress Notes (Deleted)
Name: Melanie Navarro  MRN/ DOB: [id number], 1961-04-24   Age/ Sex: 62 y.o., female    PCP: Doreene Nest, NP   Reason for Endocrinology Evaluation: Type 2 Diabetes Mellitus     Date of Initial Endocrinology Visit: 07/16/2020    PATIENT IDENTIFIER: Melanie Navarro is a 62 y.o. female with a past medical history of T2DM, HTN , hypothyroidism and Dyslipidemia. The patient presented for initial endocrinology clinic visit on 07/16/2020 for consultative assistance with her diabetes management.    DIABETIC HISTORY:  Melanie Navarro  was diagnosed with DM many years ago. Glipizide - possible  rash . Intolerant to higher doses of metformin.  Her hemoglobin A1c has ranged from 8.2% in 2016, peaking at 12.8% in 2018.  On her initial visit to our clinic her A1c is 10.8 % she was on Levemir , Metformin and Jardiance, we witched her to [place] and started Trulicit as well as continuing with Metformin and Jardiance    Stop metformin 03/2021 due to abdominal pain which is taking 1 tablet  THYROID HISTORY : Has been diagnosed with hypothyroidism ~ 2020 Has been on Lt-4 since .  She is on Biotin  No local neck symptoms   NO Fh of thyroid disease    SUBJECTIVE:   During the last visit (03/25/2021): A1c 6.6%     Today (07/16/2020): Melanie Navarro is here for a follow up on diabetes management. She has NOT been to our clinic in 17 months.  checks her blood sugars 1 times daily. The patient has  had hypoglycemic episodes since the last clinic visit, pt is symptomatic with these episodes.    Denies nausea, vomiting or diarrhea    HOME ENDOCRINE  REGIMEN: Tresiba  20 units  at night  Farxiga 10 mg daily Trulicity 1.5 mg weekly  Levothyroxine 75 mcg daily     Statin: yes ACE-I/ARB: yes Prior Diabetic Education: no    METER DOWNLOAD SUMMARY: Did not bring    DIABETIC COMPLICATIONS: Microvascular complications:  Minimal eye changes  Denies: CKD, neuropathy  Last eye exam: Completed  2020  Macrovascular complications:   Denies: CAD, PVD, CVA   PAST HISTORY: Past Medical History:  Past Medical History:  Diagnosis Date   Diabetes mellitus    History of echocardiogram    a. 07/2019 Echo: EF 60-65%. no rwma. Nl RV size/fxn. Mild Ao sclerosis w/o stenosis.    Hyperlipidemia    Hypertension    Nocturnal Bradycardia    a. 06/2019 Zio: Mobitz I progressing to CHB w/ up to 3.6 sec pause (2 episodes)-->sleep study pending.   Palpitations    a. 06/2019 Zio: Avg HR 72 (max/min 38/135). Rare PACs/PVCs. 9 atrial runs up to 9 beat/max 135 bpm. 2 brief episodes of Mobitz I progressing to CHB w/ longest pause of 3.7 secs - episodes occrred overnight/early morning hrs.   Past Surgical History:  Past Surgical History:  Procedure Laterality Date   WISDOM TOOTH EXTRACTION  1984    Social History:  reports that she has never smoked. She has never used smokeless tobacco. She reports that she does not currently use alcohol. She reports that she does not use drugs. Family History:  Family History  Problem Relation Age of Onset   Stroke Mother    Hypertension Mother    Heart disease Mother    Diabetes Mother    Peripheral Artery Disease Mother    Diabetes Father    Lung cancer Father    Diabetes  Brother    Heart attack Brother 59   O65steoarthritis Paternal Grandmother    Colon cancer Neg Hx      HOME MEDICATIONS: Allergies as of 08/17/2022       Reactions   Elemental Sulfur    Rash   Glipizide    dizziness   Penicillins    Rash   Codeine Swelling, Rash        Medication List        Accurate as of August 17, 2022  7:29 AM. If you have any questions, ask your nurse or doctor.          aspirin 81 MG tablet Take 81 mg by mouth daily.   BD Pen Needle Nano 2nd Gen 32G X 4 MM Misc Generic drug: Insulin Pen Needle USE DAILY AS DIRECTED   carvedilol 12.5 MG tablet Commonly known as: COREG Take 1 tablet (12.5 mg total) by mouth 2 (two) times daily with a meal.  For blood pressure.   cyclobenzaprine 10 MG tablet Commonly known as: FLEXERIL Take 1 tablet (10 mg total) by mouth 3 (three) times daily as needed.   dapagliflozin propanediol 10 MG Tabs tablet Commonly known as: Farxiga Take 1 tablet (10 mg total) by mouth daily before breakfast.   famotidine 20 MG tablet Commonly known as: PEPCID Take 1 tablet (20 mg total) by mouth daily as needed for heartburn or indigestion.   HYDROcodone-acetaminophen 5-325 MG tablet Commonly known as: NORCO/VICODIN Take 1 tablet by mouth every 6 (six) hours as needed for moderate pain.   levothyroxine 75 MCG tablet Commonly known as: SYNTHROID Take 1 tablet (75 mcg total) by mouth daily before breakfast.   losartan-hydrochlorothiazide 100-25 MG tablet Commonly known as: HYZAAR Take 1 tablet by mouth daily. For blood pressure.   onetouch ultrasoft lancets Use as instructed   OneTouch Verio test strip Generic drug: glucose blood Twice dAILY   pravastatin 40 MG tablet Commonly known as: PRAVACHOL TAKE 1 TABLET (40 MG TOTAL) BY MOUTH EVERY OTHER DAY. FOR CHOLESTEROL.   Evaristo Bury FlexTouch 100 UNIT/ML FlexTouch Pen Generic drug: insulin degludec INJECT 20 UNITS INTO THE SKIN DAILY   Trulicity 1.5 MG/0.5ML Sopn Generic drug: Dulaglutide INJECT 1.5 MG INTO THE SKIN ONCE A WEEK.         ALLERGIES: Allergies  Allergen Reactions   Elemental Sulfur     Rash    Glipizide     dizziness   Penicillins     Rash    Codeine Swelling and Rash     REVIEW OF SYSTEMS: A comprehensive ROS was conducted with the patient and is negative except as per HPI     OBJECTIVE:   VITAL SIGNS: There were no vitals taken for this visit.   PHYSICAL EXAM:  General: Pt appears well and is in NAD  Neck: General: Supple without adenopathy or carotid bruits. Thyroid: Thyroid size normal.  No goiter or nodules appreciated.  Lungs: Clear with good BS bilat with no rales, rhonchi, or wheezes  Heart: RRR    Abdomen: Normoactive bowel sounds, soft, nontender, without masses or organomegaly palpable  Extremities:  Lower extremities - No pretibial edema. No lesions.  Neuro: MS is good with appropriate affect, pt is alert and Ox3    DM foot exam: 07/16/2020  The skin of the feet is intact without sores or ulcerations. The pedal pulses are 2+ on right and 2+ on left. The sensation is intact to a screening 5.07, 10 gram monofilament bilaterally  DATA REVIEWED:  Lab Results  Component Value Date   HGBA1C 6.6 (A) 03/25/2021   HGBA1C 10.8 (A) 07/16/2020   HGBA1C 10.6 (H) 03/17/2020   Lab Results  Component Value Date   MICROALBUR <0.7 07/08/2021   LDLCALC 110 (H) 07/08/2021   CREATININE 0.99 07/08/2021   Lab Results  Component Value Date   MICRALBCREAT 1.7 07/08/2021    Lab Results  Component Value Date   CHOL 209 (H) 07/08/2021   HDL 79.60 07/08/2021   LDLCALC 110 (H) 07/08/2021   TRIG 94.0 07/08/2021   CHOLHDL 3 07/08/2021        ASSESSMENT / PLAN / RECOMMENDATIONS:   1) Type 2 Diabetes Mellitus, Optimally controlled, Without complications - Most recent A1c of 6.6 %. Goal A1c < 7.0 %.    - A1c down from 10.8% , praised the pt on improved glycemic control  - She is not tolerating Metformin XR, she has been taking 1 tablet infrequently due to GI side effects and will stop this,  - Tolerating trulicity , will increase the dose    MEDICATIONS:  - Decrease Tresiba 20 units daily - Increase trulicity 1.5 mg weekly  - Stop Metformin 500 mg XR  - Continue Jardiance 25 mg , 1 tablet in the morning   EDUCATION / INSTRUCTIONS: BG monitoring instructions: Patient is instructed to check her blood sugars 1 times a day, fasting  Call [facility] if: BG persistently < 70  I reviewed the Rule of 15 for the treatment of hypoglycemia in detail with the patient. Literature supplied.   2) Diabetic complications:  Eye: Does not have known diabetic retinopathy.   Neuro/ Feet: Does not have known diabetic peripheral neuropathy. Renal: Patient does not have known baseline CKD. She is on an ACEI/ARB at present.      3) Dyslipidemia: Patient is on Pravastatin. LDL historically high - She is intolerant to other statins due to myalgia  - We disucssed adding Zetia if needed in the future  - She was provided with printed lab orders to take to local [facility]   4) Hypothyroidism :  - Pt is clinically euthyroid  - She was provided with printed lab orders to take o [facility]   Medication  Continue Levothyroxine 75 mcg daily    F/U in 6 months   Signed electronically by: Lyndle Herrlich, MD  [facility] End[facility]  [facility] [address] [place] [place], [place] [address] Phone: [phone] FAX: [phone]   CC: Doreene Nest, NP [address] [place] [address] Phone: [phone]  Fax: [phone]    Return to Endocrinology clinic as below: Future Appointments  Date Time Provider Department Center  08/17/2022 11:10 AM Zyriah Mask, Konrad Dolores, MD [facility] None

## 2022-08-23 ENCOUNTER — Other Ambulatory Visit: Payer: Self-pay | Admitting: Primary Care

## 2022-08-23 DIAGNOSIS — I10 Essential (primary) hypertension: Secondary | ICD-10-CM

## 2022-09-04 ENCOUNTER — Other Ambulatory Visit: Payer: Self-pay | Admitting: Primary Care

## 2022-09-04 DIAGNOSIS — R002 Palpitations: Secondary | ICD-10-CM

## 2022-09-12 ENCOUNTER — Other Ambulatory Visit: Payer: Self-pay | Admitting: Primary Care

## 2022-09-12 DIAGNOSIS — I10 Essential (primary) hypertension: Secondary | ICD-10-CM

## 2022-09-12 NOTE — Telephone Encounter (Signed)
Patient is overdue for follow up, this will be required prior to any further refills.  Please schedule, thank you!

## 2022-09-13 ENCOUNTER — Other Ambulatory Visit: Payer: Self-pay

## 2022-09-13 DIAGNOSIS — E039 Hypothyroidism, unspecified: Secondary | ICD-10-CM

## 2022-09-13 MED ORDER — LEVOTHYROXINE SODIUM 75 MCG PO TABS
75.0000 ug | ORAL_TABLET | Freq: Every day | ORAL | 0 refills | Status: DC
Start: 2022-09-13 — End: 2022-12-20

## 2022-09-13 MED ORDER — LOSARTAN POTASSIUM-HCTZ 100-25 MG PO TABS
1.0000 | ORAL_TABLET | Freq: Every day | ORAL | 0 refills | Status: DC
Start: 2022-09-13 — End: 2022-09-26

## 2022-09-13 NOTE — Telephone Encounter (Signed)
Patient scheduled for f/u appointment, states she is running low on this medication and may need more as she only has 4-5 tablets left.

## 2022-09-13 NOTE — Telephone Encounter (Signed)
Noted. Refill(s) sent to pharmacy.  

## 2022-09-22 ENCOUNTER — Ambulatory Visit: Payer: BC Managed Care – PPO | Admitting: Primary Care

## 2022-09-24 ENCOUNTER — Ambulatory Visit: Payer: BC Managed Care – PPO | Admitting: Primary Care

## 2022-09-24 ENCOUNTER — Encounter: Payer: Self-pay | Admitting: Primary Care

## 2022-09-24 VITALS — BP 138/84 | HR 53 | Temp 97.3°F | Ht 65.25 in | Wt 188.0 lb

## 2022-09-24 DIAGNOSIS — Z23 Encounter for immunization: Secondary | ICD-10-CM

## 2022-09-24 DIAGNOSIS — Z794 Long term (current) use of insulin: Secondary | ICD-10-CM

## 2022-09-24 DIAGNOSIS — Z0000 Encounter for general adult medical examination without abnormal findings: Secondary | ICD-10-CM

## 2022-09-24 DIAGNOSIS — E785 Hyperlipidemia, unspecified: Secondary | ICD-10-CM

## 2022-09-24 DIAGNOSIS — K219 Gastro-esophageal reflux disease without esophagitis: Secondary | ICD-10-CM | POA: Diagnosis not present

## 2022-09-24 DIAGNOSIS — M25642 Stiffness of left hand, not elsewhere classified: Secondary | ICD-10-CM

## 2022-09-24 DIAGNOSIS — M25641 Stiffness of right hand, not elsewhere classified: Secondary | ICD-10-CM

## 2022-09-24 DIAGNOSIS — E1165 Type 2 diabetes mellitus with hyperglycemia: Secondary | ICD-10-CM

## 2022-09-24 DIAGNOSIS — Z1211 Encounter for screening for malignant neoplasm of colon: Secondary | ICD-10-CM

## 2022-09-24 DIAGNOSIS — Z1231 Encounter for screening mammogram for malignant neoplasm of breast: Secondary | ICD-10-CM

## 2022-09-24 DIAGNOSIS — I10 Essential (primary) hypertension: Secondary | ICD-10-CM | POA: Diagnosis not present

## 2022-09-24 DIAGNOSIS — E039 Hypothyroidism, unspecified: Secondary | ICD-10-CM

## 2022-09-24 LAB — LIPID PANEL
Cholesterol: 195 mg/dL (ref 0–200)
HDL: 74.7 mg/dL (ref >39.00)
LDL Cholesterol: 99 mg/dL (ref 0–99)
NonHDL: 119.88
Total CHOL/HDL Ratio: 3
Triglycerides: 103 mg/dL (ref 0.0–149.0)
VLDL: 20.6 mg/dL (ref 0.0–40.0)

## 2022-09-24 LAB — COMPREHENSIVE METABOLIC PANEL
ALT: 16 U/L (ref 0–35)
AST: 15 U/L (ref 0–37)
Albumin: 4.4 g/dL (ref 3.5–5.2)
Alkaline Phosphatase: 92 U/L (ref 39–117)
BUN: 26 mg/dL — ABNORMAL HIGH (ref 6–23)
CO2: 27 mEq/L (ref 19–32)
Calcium: 9.4 mg/dL (ref 8.4–10.5)
Chloride: 100 mEq/L (ref 96–112)
Creatinine, Ser: 1.02 mg/dL (ref 0.40–1.20)
GFR: 58.76 mL/min — ABNORMAL LOW (ref >60.00)
Glucose, Bld: 240 mg/dL — ABNORMAL HIGH (ref 70–99)
Potassium: 4.2 mEq/L (ref 3.5–5.1)
Sodium: 138 mEq/L (ref 135–145)
Total Bilirubin: 0.8 mg/dL (ref 0.2–1.2)
Total Protein: 7.3 g/dL (ref 6.0–8.3)

## 2022-09-24 LAB — TSH: TSH: 4.52 u[IU]/mL (ref 0.35–5.50)

## 2022-09-24 LAB — VITAMIN B12: Vitamin B-12: >1500 pg/mL — ABNORMAL HIGH (ref 211–911)

## 2022-09-24 MED ORDER — TRULICITY 0.75 MG/0.5ML ~~LOC~~ SOAJ
0.7500 mg | SUBCUTANEOUS | 0 refills | Status: DC
Start: 2022-09-24 — End: 2022-12-30

## 2022-09-24 MED ORDER — FREESTYLE LIBRE 3 SENSOR MISC
1 refills | Status: DC
Start: 2022-09-24 — End: 2022-12-30

## 2022-09-24 NOTE — Assessment & Plan Note (Signed)
Repeat lipid panel pending.  Intolerant to statins including pravastatin, rosuvastatin, atorvastatin.  Will have her hold pravastatin to see if this helps with her joints.  If it does we need to consider Zetia versus cardiology referral for other lipid-lowering agents.  Add lipoprotein a today.

## 2022-09-24 NOTE — Assessment & Plan Note (Signed)
Chronic and continued.  Resume meloxicam 15 mg as needed.

## 2022-09-24 NOTE — Patient Instructions (Addendum)
Stop by the lab prior to leaving today. I will notify you of your results once received.   [facility] your [facility].   Resume Trulicity at 0.75 mg weekly for diabetes.  You will either be contacted via phone regarding your referral to GI, or you may receive a letter on your MyChart portal from our referral team with instructions for scheduling an appointment. Please let us know if you have not been contacted by anyone within two weeks.  Schedule a nurse visit to complete your second shingles vaccine in 2 to 6 months.  It was a pleasure to see you today!

## 2022-09-24 NOTE — Assessment & Plan Note (Signed)
First Shingrix vaccine provided today. Pap smear overdue, she declines today despite recommendations. Mammogram due, orders placed. Colonoscopy overdue, referral placed to GI.  Discussed the importance of a healthy diet and regular exercise in order for weight loss, and to reduce the risk of further co-morbidity.  Exam stable. Labs pending.  Follow up in 1 year for repeat physical.

## 2022-09-24 NOTE — Assessment & Plan Note (Addendum)
Overall controlled.  Continue losartan-HCTZ 100-25 mg daily, carvedilol 12.5 mg twice daily CMP pending

## 2022-09-24 NOTE — Progress Notes (Signed)
Subjective:    Patient ID: Melanie Navarro, female    DOB: 1961-01-19, 62 y.o.   MRN: [id number]  HPI  Melanie Navarro is a very pleasant 62 y.o. female with a history of hypertension, type 2 diabetes, hypothyroidism, hyperlipidemia, insomnia who presents today for complete physical and follow up of chronic conditions.  She is under a lot of stress with work, feels like she retaining fluid to her feet, notices puffiness to her eyes, fatigue. She is sedentary at work most of her day.   Immunizations: -Tetanus: Completed within 10 years -Shingles: Never completed -Pneumonia: Completed in 2016  Diet: Fair diet.  Exercise: No regular exercise.  Eye exam: Completed >1 year ago  Dental exam: Completed > 1 year ago  Pap Smear: >5 years ago,  Mammogram: February 2023  Colonoscopy: Completed in 2017, due in 2020, agrees to complete   BP Readings from Last 3 Encounters:  09/24/22 138/84  01/19/22 138/78  12/18/21 (!) 170/92       Review of Systems  Constitutional:  Negative for unexpected weight change.  HENT:  Negative for rhinorrhea.   Respiratory:  Negative for shortness of breath.   Cardiovascular:  Positive for leg swelling. Negative for chest pain.  Gastrointestinal:  Negative for constipation and diarrhea.  Genitourinary:  Negative for difficulty urinating.  Musculoskeletal:  Positive for arthralgias.  Skin:  Negative for rash.  Allergic/Immunologic: Negative for environmental allergies.  Neurological:  Negative for dizziness and headaches.  Psychiatric/Behavioral:  The patient is not nervous/anxious.          Past Medical History:  Diagnosis Date   Diabetes mellitus    History of echocardiogram    a. 07/2019 Echo: EF 60-65%. no rwma. Nl RV size/fxn. Mild Ao sclerosis w/o stenosis.    Hyperlipidemia    Hypertension    Nocturnal Bradycardia    a. 06/2019 Zio: Mobitz I progressing to CHB w/ up to 3.6 sec pause (2 episodes)-->sleep study pending.   Palpitations     a. 06/2019 Zio: Avg HR 72 (max/min 38/135). Rare PACs/PVCs. 9 atrial runs up to 9 beat/max 135 bpm. 2 brief episodes of Mobitz I progressing to CHB w/ longest pause of 3.7 secs - episodes occrred overnight/early morning hrs.    Social History   Socioeconomic History   Marital status: Married    Spouse name: Not on file   Number of children: Not on file   Years of education: Not on file   Highest education level: 12th grade  Occupational History   Not on file  Tobacco Use   Smoking status: Never   Smokeless tobacco: Never  Vaping Use   Vaping Use: Never used  Substance and Sexual Activity   Alcohol use: Not Currently    Comment: 1 drink per month   Drug use: No   Sexual activity: Not on file  Other Topics Concern   Not on file  Social History Narrative   Married.   2 children. 1 grandchild.   Works in Photographer.   Enjoys gardening, flowers, spending time with family.   Social Determinants of Health   Financial Resource Strain: Low Risk  (09/23/2022)   Overall Financial Resource Strain (CARDIA)    Difficulty of Paying Living Expenses: Not very hard  Food Insecurity: No Food Insecurity (09/23/2022)   Hunger Vital Sign    Worried About Running Out of Food in the Last Year: Never true    Ran Out of Food in the Last Year: Never  true  Transportation Needs: No Transportation Needs (09/23/2022)   PRAPARE - Administrator, Civil Service (Medical): No    Lack of Transportation (Non-Medical): No  Physical Activity: Insufficiently Active (09/23/2022)   Exercise Vital Sign    Days of Exercise per Week: 1 day    Minutes of Exercise per Session: 10 min  Stress: Stress Concern Present (09/23/2022)   [employer] of Occupational Health - Occupational Stress Questionnaire    Feeling of Stress : To some extent  Social Connections: Socially Integrated (09/23/2022)   Social Connection and Isolation Panel [NHANES]    Frequency of Communication with Friends and Family: Twice a  week    Frequency of Social Gatherings with Friends and Family: Twice a week    Attends Religious Services: 1 to 4 times per year    Active Member of [employer] or Organizations: Yes    Attends Engineer, structural: 1 to 4 times per year    Marital Status: Married  Catering manager Violence: Not on file    Past Surgical History:  Procedure Laterality Date   WISDOM TOOTH EXTRACTION  1984    Family History  Problem Relation Age of Onset   Stroke Mother    Hypertension Mother    Heart disease Mother    Diabetes Mother    Peripheral Artery Disease Mother    Diabetes Father    Lung cancer Father    Diabetes Brother    Heart attack Brother 36   Osteoarthritis Paternal Grandmother    Colon cancer Neg Hx     Allergies  Allergen Reactions   Elemental Sulfur     Rash    Glipizide     dizziness   Penicillins     Rash    Codeine Swelling and Rash    Current Outpatient Medications on File Prior to Visit  Medication Sig Dispense Refill   aspirin 81 MG tablet Take 81 mg by mouth daily.     carvedilol (COREG) 12.5 MG tablet Take 1 tablet (12.5 mg total) by mouth 2 (two) times daily with a meal. For blood pressure. 180 tablet 3   cyclobenzaprine (FLEXERIL) 10 MG tablet Take 1 tablet (10 mg total) by mouth 3 (three) times daily as needed. 30 tablet 0   dapagliflozin propanediol (FARXIGA) 10 MG TABS tablet Take 1 tablet (10 mg total) by mouth daily before breakfast. 30 tablet 6   glucose blood (ONETOUCH VERIO) test strip Twice dAILY 100 each 12   insulin degludec (TRESIBA FLEXTOUCH) 100 UNIT/ML FlexTouch Pen INJECT 20 UNITS INTO THE SKIN DAILY 15 mL 0   Insulin Pen Needle (BD PEN NEEDLE NANO 2ND GEN) 32G X 4 MM MISC USE DAILY AS DIRECTED 200 each 3   Lancets (ONETOUCH ULTRASOFT) lancets Use as instructed 30 each 1   levothyroxine (SYNTHROID) 75 MCG tablet Take 1 tablet (75 mcg total) by mouth daily before breakfast. 90 tablet 0   losartan-hydrochlorothiazide (HYZAAR) 100-25 MG  tablet Take 1 tablet by mouth daily. For blood pressure. 30 tablet 0   pravastatin (PRAVACHOL) 40 MG tablet TAKE 1 TABLET (40 MG TOTAL) BY MOUTH EVERY OTHER DAY. FOR CHOLESTEROL. 15 tablet 0   famotidine (PEPCID) 20 MG tablet Take 1 tablet (20 mg total) by mouth daily as needed for heartburn or indigestion. 90 tablet 0   No current facility-administered medications on file prior to visit.    BP 138/84   Pulse (!) 53   Temp (!) 97.3 F (36.3 C) (  Temporal)   Ht 5' 5.25" (1.657 m)   Wt 188 lb (85.3 kg)   SpO2 99%   BMI 31.05 kg/m  Objective:   Physical Exam HENT:     Right Ear: Tympanic membrane and ear canal normal.     Left Ear: Tympanic membrane and ear canal normal.     Nose: Nose normal.  Eyes:     Conjunctiva/sclera: Conjunctivae normal.     Pupils: Pupils are equal, round, and reactive to light.  Neck:     Thyroid: No thyromegaly.  Cardiovascular:     Rate and Rhythm: Normal rate and regular rhythm.     Heart sounds: No murmur heard.    Comments: Mild swelling to bilateral dorsal feet and ankles.  No pitting. Pulmonary:     Effort: Pulmonary effort is normal.     Breath sounds: Normal breath sounds. No rales.  Abdominal:     General: Bowel sounds are normal.     Palpations: Abdomen is soft.     Tenderness: There is no abdominal tenderness.  Musculoskeletal:        General: Normal range of motion.     Cervical back: Neck supple.     Comments: Heberden's nodes noted to bilateral hands with slight disfigurement of several fingers.  Lymphadenopathy:     Cervical: No cervical adenopathy.  Skin:    General: Skin is warm and dry.     Findings: No rash.  Neurological:     Mental Status: She is alert and oriented to person, place, and time.     Cranial Nerves: No cranial nerve deficit.     Deep Tendon Reflexes: Reflexes are normal and symmetric.  Psychiatric:        Mood and Affect: Mood normal.           Assessment & Plan:  Encounter for preventive health  examination Assessment & Plan: First Shingrix vaccine provided today. Pap smear overdue, she declines today despite recommendations. Mammogram due, orders placed. Colonoscopy overdue, referral placed to GI.  Discussed the importance of a healthy diet and regular exercise in order for weight loss, and to reduce the risk of further co-morbidity.  Exam stable. Labs pending.  Follow up in 1 year for repeat physical.    Essential hypertension Assessment & Plan: Overall controlled.  Continue losartan-HCTZ 100-25 mg daily, carvedilol 12.5 mg twice daily CMP pending  Orders: -     Comprehensive metabolic panel  Gastroesophageal reflux disease, unspecified whether esophagitis present Assessment & Plan: Controlled.  Continue famotidine 20 mg daily PRN.    Type 2 diabetes mellitus with hyperglycemia, with long-term current use of insulin [facility]) Assessment & Plan: Following with endocrinology.   Continue Farxiga 10 mg daily, Tresiba 20 units daily. Resume Trulicity at 0.75 mg weekly.  Follow-up with endocrinology as scheduled.  Orders: -     FreeStyle Libre 3 Sensor; Place 1 sensor on the skin every 14 days. Use to check glucose continuously  Dispense: 6 each; Refill: 1 -     Trulicity; Inject 0.75 mg into the skin once a week. for diabetes.  Dispense: 6 mL; Refill: 0  Acquired hypothyroidism Assessment & Plan: She is taking levothyroxine correctly. Continue levothyroxine 75 mcg daily.  Repeat TSH pending.  Orders: -     Vitamin B12 -     TSH  Hyperlipidemia, unspecified hyperlipidemia type Assessment & Plan: Repeat lipid panel pending.  Intolerant to statins including pravastatin, rosuvastatin, atorvastatin.  Will have her hold pravastatin to see  if this helps with her joints.  If it does we need to consider Zetia versus cardiology referral for other lipid-lowering agents.  Add lipoprotein a today.  Orders: -     Lipoprotein A (LPA) -     Lipid  panel  Stiffness of joints of both hands Assessment & Plan: Chronic and continued.  Resume meloxicam 15 mg as needed.   Screening mammogram for breast cancer -     3D Screening Mammogram, Left and Right; Future  Screening for colon cancer -     Ambulatory referral to Gastroenterology        Doreene Nest, NP

## 2022-09-24 NOTE — Assessment & Plan Note (Addendum)
Controlled.  Continue famotidine 20 mg daily PRN.

## 2022-09-24 NOTE — Assessment & Plan Note (Signed)
She is taking levothyroxine correctly.  Continue levothyroxine 75 mcg daily. Repeat TSH pending. 

## 2022-09-24 NOTE — Assessment & Plan Note (Signed)
Following with endocrinology.   Continue Farxiga 10 mg daily, Tresiba 20 units daily. Resume Trulicity at 0.75 mg weekly.  Follow-up with endocrinology as scheduled.

## 2022-09-26 ENCOUNTER — Other Ambulatory Visit: Payer: Self-pay | Admitting: Internal Medicine

## 2022-09-26 ENCOUNTER — Other Ambulatory Visit: Payer: Self-pay | Admitting: Primary Care

## 2022-09-26 DIAGNOSIS — R002 Palpitations: Secondary | ICD-10-CM

## 2022-09-26 DIAGNOSIS — K219 Gastro-esophageal reflux disease without esophagitis: Secondary | ICD-10-CM

## 2022-09-26 DIAGNOSIS — I10 Essential (primary) hypertension: Secondary | ICD-10-CM

## 2022-09-29 LAB — LIPOPROTEIN A (LPA): Lipoprotein (a): 171 nmol/L — ABNORMAL HIGH (ref <75)

## 2022-09-30 DIAGNOSIS — E785 Hyperlipidemia, unspecified: Secondary | ICD-10-CM

## 2022-10-04 ENCOUNTER — Other Ambulatory Visit: Payer: Self-pay | Admitting: Internal Medicine

## 2022-10-04 DIAGNOSIS — E039 Hypothyroidism, unspecified: Secondary | ICD-10-CM

## 2022-10-07 MED ORDER — NEXLIZET 180-10 MG PO TABS
1.0000 | ORAL_TABLET | Freq: Every day | ORAL | 3 refills | Status: DC
Start: 2022-10-07 — End: 2022-10-18

## 2022-10-07 NOTE — Telephone Encounter (Signed)
Can we check on the prior authorization for the Trulicity?

## 2022-10-08 ENCOUNTER — Other Ambulatory Visit: Payer: Self-pay | Admitting: Primary Care

## 2022-10-08 ENCOUNTER — Other Ambulatory Visit (HOSPITAL_COMMUNITY): Payer: Self-pay

## 2022-10-08 ENCOUNTER — Telehealth: Payer: Self-pay

## 2022-10-08 DIAGNOSIS — E785 Hyperlipidemia, unspecified: Secondary | ICD-10-CM

## 2022-10-08 NOTE — Telephone Encounter (Signed)
Pharmacy Patient Advocate Encounter   Received notification from [facility] that prior authorization for Trulicity 0.75MG /0.5ML pen-injectors is required/requested.   PA submitted to [employer] via CoverMyMeds Key or [facility]) confirmation # [id number] Status is pending

## 2022-10-08 NOTE — Telephone Encounter (Signed)
A Prior Authorization was initiated for this patients NEXLIZET through CoverMyMeds.   Key: Drue Stager

## 2022-10-10 NOTE — Telephone Encounter (Signed)
Can we submit a PA for the Nexlizet medication  She has statin intolerance to 3+ statin medications, history of diabetes, hypertension, and hyperlipidemia which place her at high risk for heart attack/stroke.

## 2022-10-11 NOTE — Telephone Encounter (Signed)
Prior Auth for patients medication NEXLIZET denied by [employer] via CoverMyMeds.   Reason:  We may be able to approve this drug when you have tried a certain other drugs first (if you have had a trial of ezetimibe therapy and were unable to meet lowdensity lipoprotein cholesterol [LDL-C] goal). We do not see that you have tried this other drug first, it didn't work for you or caused you harm. If you have tried this other drug, we may need more information (if you have certain test results; if you have had a certain response to treatment; if your liver is working well).  CoverMyMeds KeyDrue Stager PA Case ID: [id number]

## 2022-10-11 NOTE — Telephone Encounter (Signed)
Pharmacy Patient Advocate Encounter  Received notification from [employer] that the request for prior authorization for Trulicity 0.75MG /0.5ML pen-injectors has been denied due to no recent lab results to confirm the diagnosis of type 2 diabetes mellitus - HbA1C greater than or equal to 6.5 percent; fasting plasma glucose greater than or equal to 126 milligrams per deciliters [mg/dL] [after fasting for at least 8 hours]; two hour plasma glucose greater than or equal to 200 mg/dL as part of an oral glucose tolerance test [75 grams of oral glucose after fasting for at least 8 hours]; or symptoms of hyperglycemia [including polyuria, polydipsia, polyphagia] and a random plasma glucose greater than or equal to 200 mg/dL.      Please be advised we currently do not have a Pharmacist to review denials, therefore you will need to process appeals accordingly as needed. Thanks for your support at this time.   You may call [phone] or fax [phone], to appeal.

## 2022-10-11 NOTE — Telephone Encounter (Signed)
Please call patient:  Let her know that her endocrinologist needs to refill her Trulicity. Insurance would not accept from [place] as we do not have an updated A1C on file.   Also, tell her that I sent a my chart message regarding the cholesterol medication.

## 2022-10-12 ENCOUNTER — Other Ambulatory Visit (HOSPITAL_COMMUNITY): Payer: Self-pay

## 2022-10-12 NOTE — Telephone Encounter (Signed)
This PA was denied in separate encounter. Please sign off as PA team is unable to resolve Rx Requests. Thanks

## 2022-10-12 NOTE — Telephone Encounter (Signed)
Unable to reach patient. Left voicemail to return call to our office.   

## 2022-10-13 NOTE — Telephone Encounter (Signed)
Patient returned call, she would like a call back.

## 2022-10-13 NOTE — Telephone Encounter (Signed)
Called patient and reviewed all information. Patient verbalized understanding. Will call if any further questions.  

## 2022-10-16 NOTE — Telephone Encounter (Signed)
Please call patient:  She has not responded to my message via MyChart that was sent on 10/11/2022.  It is attached to this encounter.

## 2022-10-18 MED ORDER — EZETIMIBE 10 MG PO TABS
10.0000 mg | ORAL_TABLET | Freq: Every day | ORAL | 0 refills | Status: DC
Start: 2022-10-18 — End: 2023-07-21

## 2022-10-18 NOTE — Telephone Encounter (Signed)
Called patient and reviewed all information. Patient verbalized understanding.  Patient is agreeable to trying Zetia Will call if any further questions.

## 2022-10-18 NOTE — Addendum Note (Signed)
Addended by: Doreene Nest on: 10/18/2022 02:49 PM   Modules accepted: Orders

## 2022-11-02 ENCOUNTER — Other Ambulatory Visit: Payer: Self-pay | Admitting: Internal Medicine

## 2022-11-02 DIAGNOSIS — E1165 Type 2 diabetes mellitus with hyperglycemia: Secondary | ICD-10-CM

## 2022-11-03 ENCOUNTER — Other Ambulatory Visit: Payer: Self-pay | Admitting: Internal Medicine

## 2022-11-03 DIAGNOSIS — E1165 Type 2 diabetes mellitus with hyperglycemia: Secondary | ICD-10-CM

## 2022-11-05 ENCOUNTER — Other Ambulatory Visit: Payer: Self-pay

## 2022-11-05 DIAGNOSIS — E1165 Type 2 diabetes mellitus with hyperglycemia: Secondary | ICD-10-CM

## 2022-11-05 MED ORDER — DAPAGLIFLOZIN PROPANEDIOL 10 MG PO TABS
10.0000 mg | ORAL_TABLET | Freq: Every day | ORAL | 0 refills | Status: DC
Start: 2022-11-05 — End: 2022-12-20

## 2022-11-12 ENCOUNTER — Other Ambulatory Visit: Payer: Self-pay | Admitting: Internal Medicine

## 2022-11-24 DIAGNOSIS — E113512 Type 2 diabetes mellitus with proliferative diabetic retinopathy with macular edema, left eye: Secondary | ICD-10-CM | POA: Diagnosis not present

## 2022-11-24 DIAGNOSIS — H3342 Traction detachment of retina, left eye: Secondary | ICD-10-CM | POA: Diagnosis not present

## 2022-12-08 DIAGNOSIS — E113512 Type 2 diabetes mellitus with proliferative diabetic retinopathy with macular edema, left eye: Secondary | ICD-10-CM | POA: Diagnosis not present

## 2022-12-11 IMAGING — MG DIGITAL DIAGNOSTIC BILAT W/ TOMO W/ CAD
8 series · 8 of 24 positions shown · non-contrast
Comparison: None

CLINICAL DATA: Diffuse medial left breast pain

EXAM:
DIGITAL DIAGNOSTIC BILATERAL MAMMOGRAM WITH TOMOSYNTHESIS AND CAD
TECHNIQUE: Bilateral digital diagnostic mammography and breast tomosynthesis
was performed. The images were evaluated with computer-aided
detection.

[R MLO synth-2D]
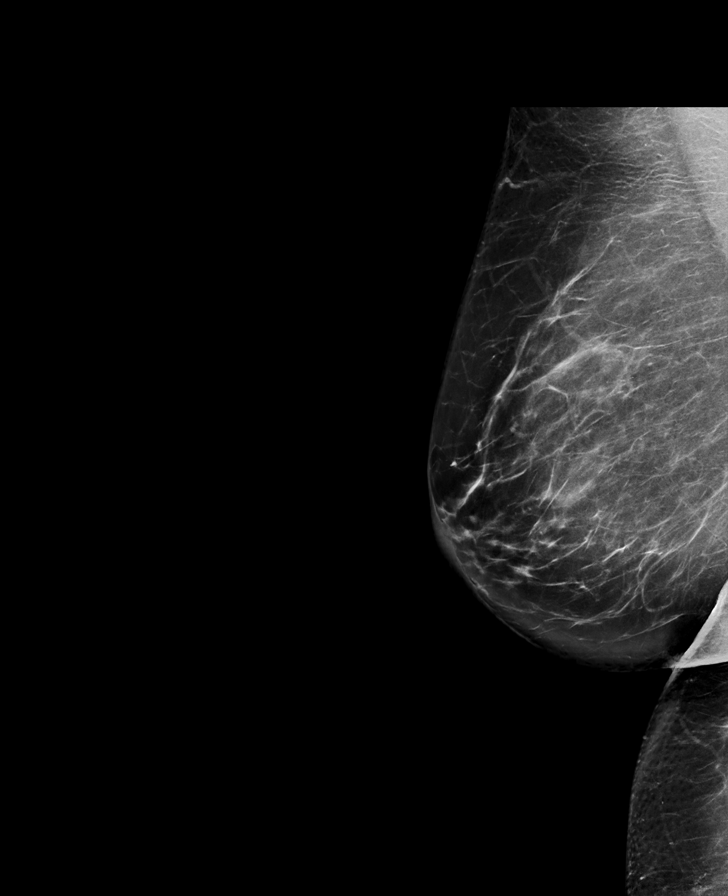

[L MLO synth-2D]
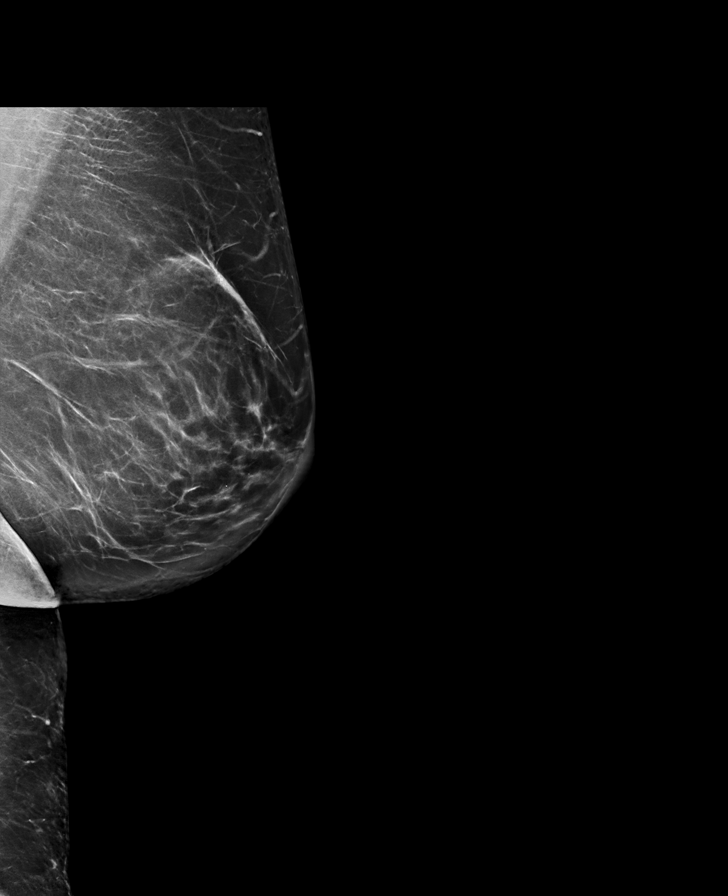

[L CC synth-2D]
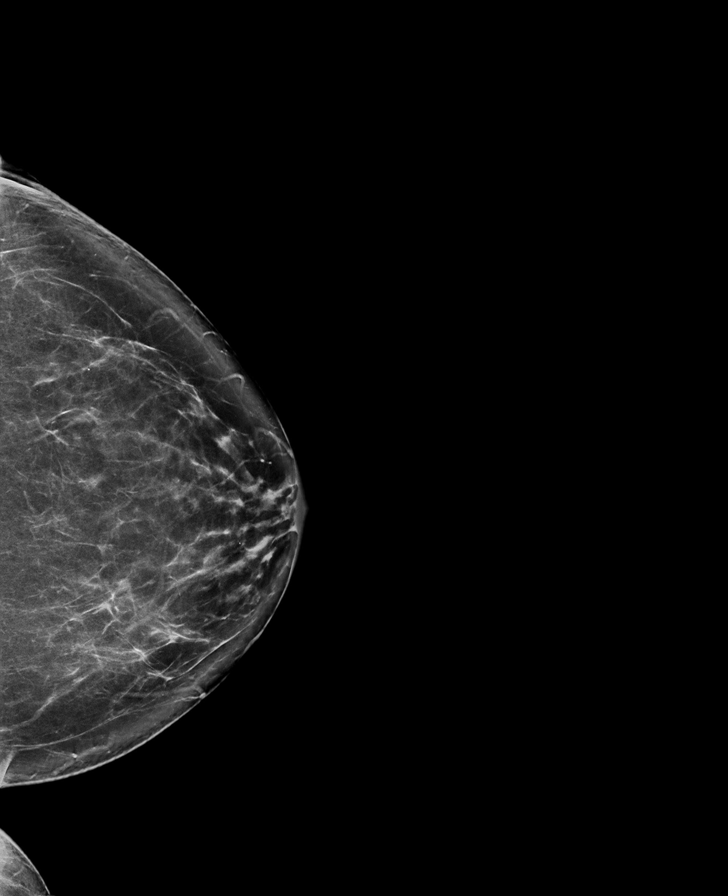

[R CC synth-2D]
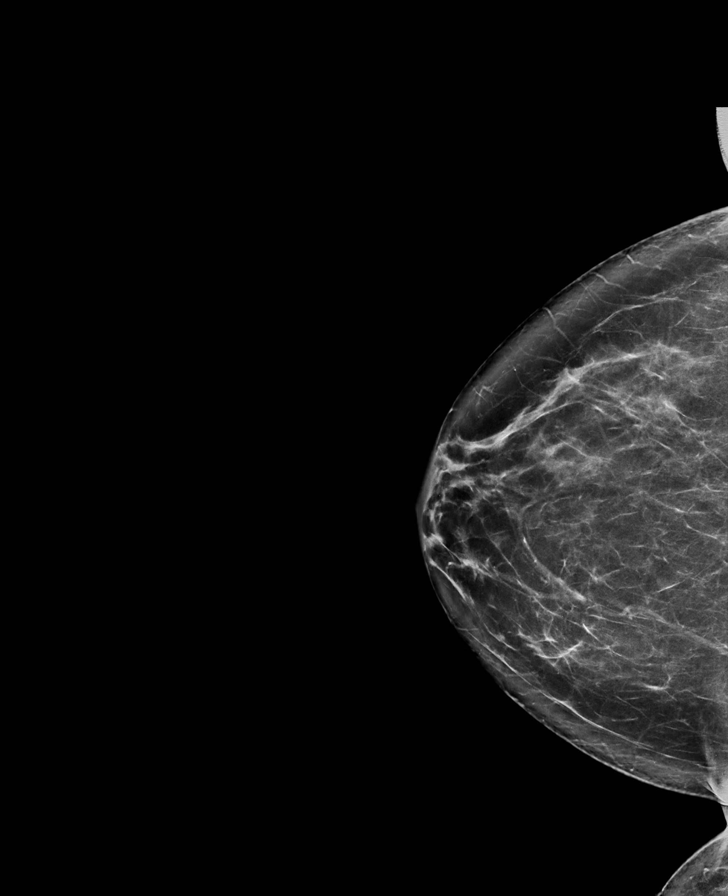

[R CC tomo · tomo slice 39/78.0]
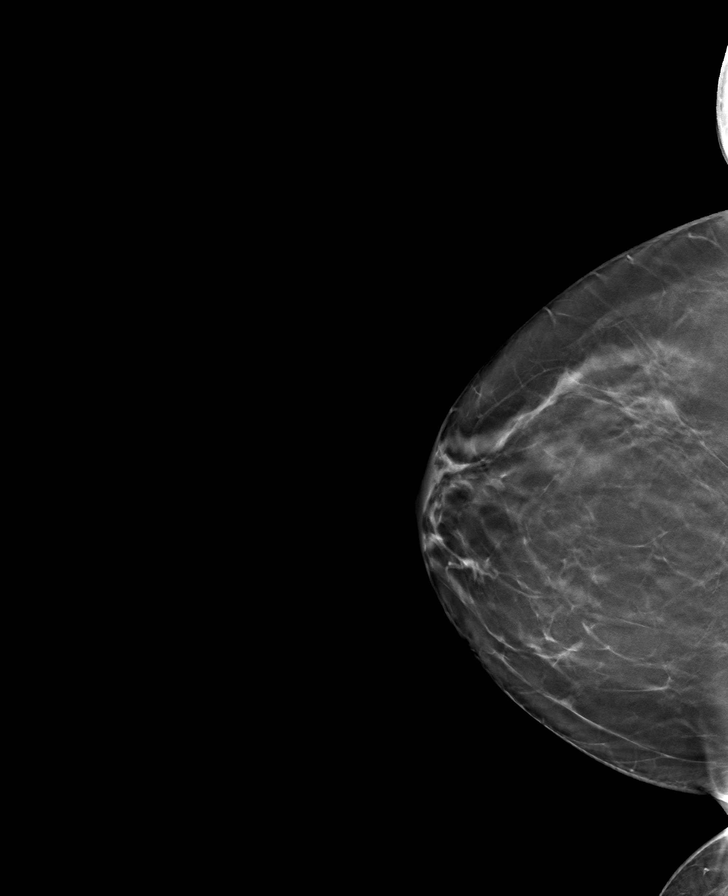

[L MLO tomo · tomo slice 45/89.0]
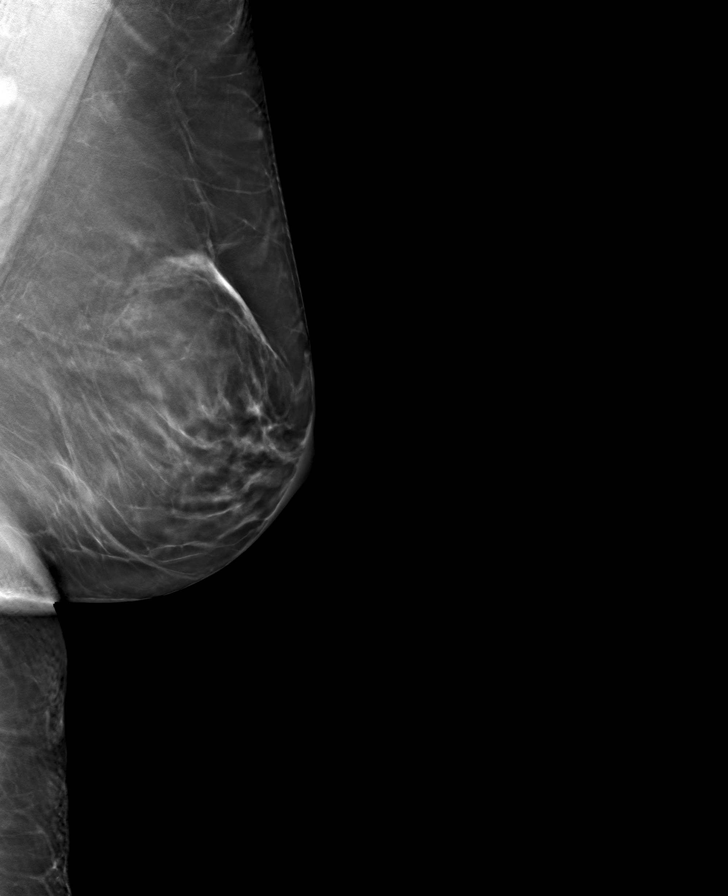

[R MLO tomo · tomo slice 46/91.0]
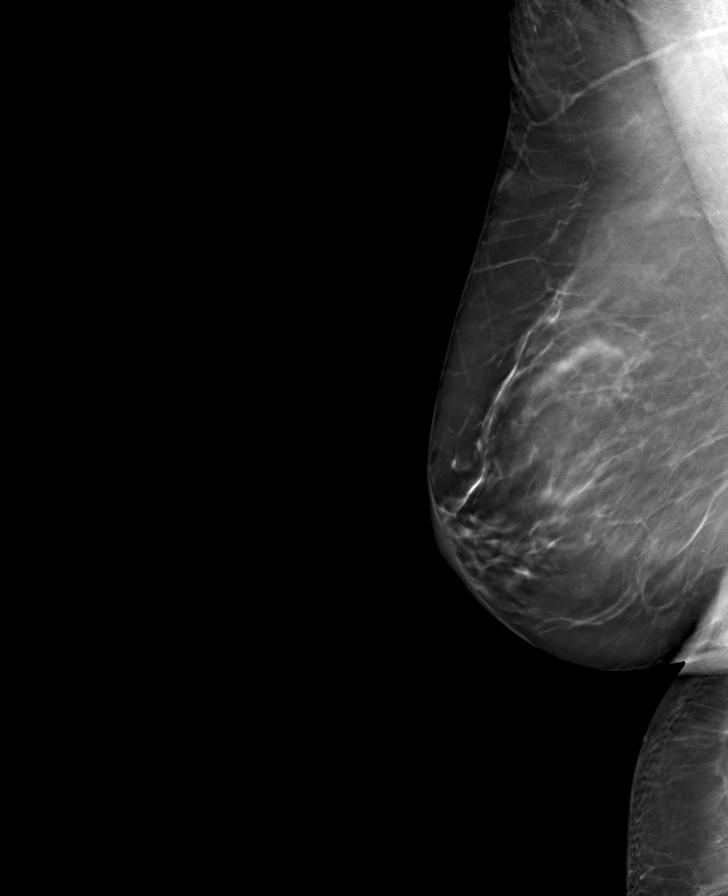

[L CC tomo · tomo slice 42/83.0]
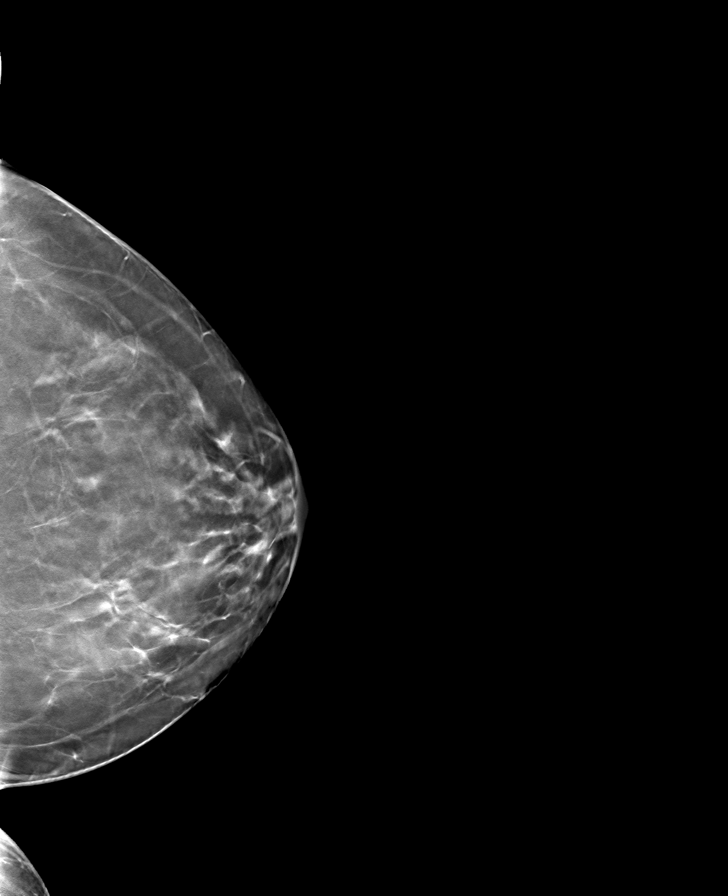

[8 of 24 positions shown; findings below may reference images not displayed]

ACR Breast Density Category b: There are scattered areas of
fibroglandular density.
FINDINGS: Cc and MLO views of bilateral breasts are submitted. No suspicious
abnormalities bilaterally.
IMPRESSION: Negative.

RECOMMENDATION:
Routine screening mammogram in 1 year. Also recommend management
clinical basis for left breast pain.

I have discussed the findings and recommendations with the patient.
If applicable, a reminder letter will be sent to the patient
regarding the next appointment.

BI-RADS CATEGORY  1: Negative.

## 2022-12-13 DIAGNOSIS — H25812 Combined forms of age-related cataract, left eye: Secondary | ICD-10-CM | POA: Diagnosis not present

## 2022-12-20 ENCOUNTER — Other Ambulatory Visit: Payer: Self-pay | Admitting: Internal Medicine

## 2022-12-20 DIAGNOSIS — E039 Hypothyroidism, unspecified: Secondary | ICD-10-CM

## 2022-12-20 DIAGNOSIS — E1165 Type 2 diabetes mellitus with hyperglycemia: Secondary | ICD-10-CM

## 2022-12-28 ENCOUNTER — Other Ambulatory Visit: Payer: Self-pay | Admitting: Internal Medicine

## 2022-12-28 DIAGNOSIS — E039 Hypothyroidism, unspecified: Secondary | ICD-10-CM

## 2022-12-29 NOTE — Progress Notes (Signed)
Name: Melanie Navarro  MRN/ DOB: [id number], 04-12-1961   Age/ Sex: 62 y.o., female    PCP: Doreene Nest, NP   Reason for Endocrinology Evaluation: Type 2 Diabetes Mellitus     Date of Initial Endocrinology Visit: 07/16/2020    PATIENT IDENTIFIER: Melanie Navarro is a 62 y.o. female with a past medical history of T2DM, HTN , hypothyroidism and Dyslipidemia. The patient presented for initial endocrinology clinic visit on 07/16/2020 for consultative assistance with her diabetes management.    DIABETIC HISTORY:  Melanie Navarro  was diagnosed with DM many years ago. Glipizide - possible  rash . Intolerant to higher doses of metformin.  Her hemoglobin A1c has ranged from 8.2% in 2016, peaking at 12.8% in 2018.  On her initial visit to our clinic her A1c is 10.8 % she was on Levemir , Metformin and Jardiance, we witched her to [place] and started Trulicit as well as continuing with Metformin and Jardiance   THYROID HISTORY : Has been diagnosed with hypothyroidism ~ 2020 Has been on Lt-4 since .  She is on Biotin  No local neck symptoms   NO Fh of thyroid disease    SUBJECTIVE:   During the last visit (03/25/2021): A1c 6.6%     Today (07/16/2020): Melanie Navarro is here for a follow up on diabetes management. She has NOT been to our clinic in 21 months.She  checks her blood sugars  multiple times daily, through freestyle libre 3.    Has been out of Trulicity since the winter  Denies nausea or vomiting  Denies constipation or diarrhea  Scheduled for cataract in next weeks      HOME ENDOCRINE  REGIMEN: Tresiba  20 units  at night  Farxiga 10 mg daliy  Trulicity 0.75 mg weekly  Levothyroxine 75 mcg daily     Statin: yes ACE-I/ARB: yes Prior Diabetic Education: no   CONTINUOUS GLUCOSE MONITORING RECORD INTERPRETATION    Dates of Recording: 8/16 - 12/30/2022  Sensor description: Freestyle libre  Results statistics:   CGM use % of time 96  Average and SD 168/28.1  Time in  range      65%  % Time Above 180 30  % Time above 250 5  % Time Below target 0   Glycemic patterns summary: BG's trend down to optimal levels overnight and fluctuate during the day  Hyperglycemic episodes postprandial  Hypoglycemic episodes occurred N/A  Overnight periods: Optimal    DIABETIC COMPLICATIONS: Microvascular complications:  Left eye DR ? Denies: CKD, neuropathy  Last eye exam: Completed 11/2022  Macrovascular complications:   Denies: CAD, PVD, CVA   PAST HISTORY: Past Medical History:  Past Medical History:  Diagnosis Date   Diabetes mellitus    History of echocardiogram    a. 07/2019 Echo: EF 60-65%. no rwma. Nl RV size/fxn. Mild Ao sclerosis w/o stenosis.    Hyperlipidemia    Hypertension    Nocturnal Bradycardia    a. 06/2019 Zio: Mobitz I progressing to CHB w/ up to 3.6 sec pause (2 episodes)-->sleep study pending.   Palpitations    a. 06/2019 Zio: Avg HR 72 (max/min 38/135). Rare PACs/PVCs. 9 atrial runs up to 9 beat/max 135 bpm. 2 brief episodes of Mobitz I progressing to CHB w/ longest pause of 3.7 secs - episodes occrred overnight/early morning hrs.   Past Surgical History:  Past Surgical History:  Procedure Laterality Date   WISDOM TOOTH EXTRACTION  1984    Social History:  reports that she has never smoked. She has never used smokeless tobacco. She reports that she does not currently use alcohol. She reports that she does not use drugs. Family History:  Family History  Problem Relation Age of Onset   Stroke Mother    Hypertension Mother    Heart disease Mother    Diabetes Mother    Peripheral Artery Disease Mother    Diabetes Father    Lung cancer Father    Diabetes Brother    Heart attack Brother 8   Osteoarthritis Paternal Grandmother    Colon cancer Neg Hx      HOME MEDICATIONS: Allergies as of 12/30/2022       Reactions   Elemental Sulfur    Rash   Glipizide    dizziness   Penicillins    Rash   Codeine Swelling, Rash         Medication List        Accurate as of December 30, 2022 10:57 AM. If you have any questions, ask your nurse or doctor.          STOP taking these medications    pravastatin 40 MG tablet Commonly known as: PRAVACHOL Stopped by: Johnney Ou Onesha Krebbs       TAKE these medications    aspirin 81 MG tablet Take 81 mg by mouth daily.   BD Pen Needle Nano 2nd Gen 32G X 4 MM Misc Generic drug: Insulin Pen Needle USE DAILY AS DIRECTED   carvedilol 12.5 MG tablet Commonly known as: COREG TAKE 1 TABLET (12.5 MG TOTAL) BY MOUTH 2 (TWO) TIMES DAILY WITH A MEAL. FOR BLOOD PRESSURE.   cyclobenzaprine 10 MG tablet Commonly known as: FLEXERIL Take 1 tablet (10 mg total) by mouth 3 (three) times daily as needed.   ezetimibe 10 MG tablet Commonly known as: Zetia Take 1 tablet (10 mg total) by mouth daily. for cholesterol.   famotidine 20 MG tablet Commonly known as: PEPCID Take 1 tablet (20 mg total) by mouth daily as needed for heartburn or indigestion.   Farxiga 10 MG Tabs tablet Generic drug: dapagliflozin propanediol TAKE 1 TABLET BY MOUTH DAILY BEFORE BREAKFAST.   FreeStyle Libre 3 Sensor Misc Place 1 sensor on the skin every 14 days. Use to check glucose continuously   levothyroxine 75 MCG tablet Commonly known as: SYNTHROID TAKE 1 TABLET BY MOUTH DAILY BEFORE BREAKFAST.   losartan-hydrochlorothiazide 100-25 MG tablet Commonly known as: HYZAAR TAKE 1 TABLET BY MOUTH DAILY FOR BLOOD PRESSURE   onetouch ultrasoft lancets Use as instructed   OneTouch Verio test strip Generic drug: glucose blood Twice dAILY   Tresiba FlexTouch 100 UNIT/ML FlexTouch Pen Generic drug: insulin degludec INJECT 20 UNITS INTO THE SKIN DAILY   Trulicity 0.75 MG/0.5ML Sopn Generic drug: Dulaglutide Inject 0.75 mg into the skin once a week. for diabetes.         ALLERGIES: Allergies  Allergen Reactions   Elemental Sulfur     Rash    Glipizide     dizziness   Penicillins      Rash    Codeine Swelling and Rash     REVIEW OF SYSTEMS: A comprehensive ROS was conducted with the patient and is negative except as per HPI     OBJECTIVE:   VITAL SIGNS: BP 122/74 (BP Location: Left Arm, Patient Position: Sitting, Cuff Size: Large)   Pulse 66   Ht 5' 5.52" (1.664 m)   Wt 184 lb (83.5 kg)   SpO2  97%   BMI 30.14 kg/m    PHYSICAL EXAM:  General: Pt appears well and is in NAD  Neck: General: Supple without adenopathy or carotid bruits. Thyroid: Thyroid size normal.  No goiter or nodules appreciated.  Lungs: Clear with good BS bilat with no rales, rhonchi, or wheezes  Heart: RRR   Abdomen: Normoactive bowel sounds, soft, nontender  Extremities:  Lower extremities - No pretibial edema. No lesions.  Neuro: MS is good with appropriate affect, pt is alert and Ox3    DM foot exam: 12/30/2022  The skin of the feet is intact without sores or ulcerations. The pedal pulses are 2+ on right and 2+ on left. The sensation is intact to a screening 5.07, 10 gram monofilament bilaterally   DATA REVIEWED:  Lab Results  Component Value Date   HGBA1C 8.4 (A) 12/30/2022   HGBA1C 6.6 (A) 03/25/2021   HGBA1C 10.8 (A) 07/16/2020    Latest Reference Range & Units 09/24/22 11:45  Sodium 135 - 145 mEq/L 138  Potassium 3.5 - 5.1 mEq/L 4.2  Chloride 96 - 112 mEq/L 100  CO2 19 - 32 mEq/L 27  Glucose 70 - 99 mg/dL 161 (H)  BUN 6 - 23 mg/dL 26 (H)  Creatinine 0.96 - 1.20 mg/dL 0.45  Calcium 8.4 - 40.9 mg/dL 9.4  Alkaline Phosphatase 39 - 117 U/L 92  Albumin 3.5 - 5.2 g/dL 4.4  AST 0 - 37 U/L 15  ALT 0 - 35 U/L 16  Total Protein 6.0 - 8.3 g/dL 7.3  Total Bilirubin 0.2 - 1.2 mg/dL 0.8  GFR >81.19 mL/min 58.76 (L)    Latest Reference Range & Units 09/24/22 11:45  Total CHOL/HDL Ratio  3  Cholesterol 0 - 200 mg/dL 147  HDL Cholesterol >82.95 mg/dL 62.13  LDL (calc) 0 - 99 mg/dL 99  NonHDL  086.57  Triglycerides 0.0 - 149.0 mg/dL 846.9  VLDL 0.0 - 62.9 mg/dL 52.8     Latest Reference Range & Units 09/24/22 11:45  TSH 0.35 - 5.50 uIU/mL 4.52    ASSESSMENT / PLAN / RECOMMENDATIONS:   1) Type 2 Diabetes Mellitus, Poorly  controlled, Without complications - Most recent A1c of 8.4 %. Goal A1c < 7.0 %.     -Patient has not been to our office in 21 months, in the interim her A1c has increased from 6.6% to 8.4% -She has not been on Trulicity since winter 2023, no intolerance issues but due to insurance issues, today we discussed starting Mounjaro, caution against GI side effects, she did well on Trulicity - Intolerant to Metformin XR -I will decrease insulin preemptively to prevent hypoglycemia -Patient may contact our office to increase Mounjaro in the next 1-2 months if no side effects to increase the dose  MEDICATIONS:  - Decrease Tresiba 18 units daily - Continue Farxiga  10 mg , 1 tablet in the morning  -Start Mounjaro 2.5 mg weekly  EDUCATION / INSTRUCTIONS: BG monitoring instructions: Patient is instructed to check her blood sugars 1 times a day, fasting  Call [facility] if: BG persistently < 70  I reviewed the Rule of 15 for the treatment of hypoglycemia in detail with the patient. Literature supplied.   2) Diabetic complications:  Eye: Does not have known diabetic retinopathy.  Neuro/ Feet: Does not have known diabetic peripheral neuropathy. Renal: Patient does not have known baseline CKD. She is on an ACEI/ARB at present.      3) Dyslipidemia:   -LDL has trended down  to 99 mg/DL 05/6107 - She is intolerant to other statins due to myalgia  -She is to be on pravastatin, but now she is on Zetia    4) Hypothyroidism :  -TSH normal range   Medication  Continue Levothyroxine 75 mcg daily    F/U in 4 months   Signed electronically by: Lyndle Herrlich, MD  [facility] End[facility]  [facility] [address] [place] [place], [place] [address] Phone: [phone] FAX: [phone]    CC: Doreene Nest, NP [address] [place] [address] Phone: [phone]  Fax: [phone]    Return to Endocrinology clinic as below: No future appointments.

## 2022-12-30 ENCOUNTER — Other Ambulatory Visit: Payer: Self-pay | Admitting: Internal Medicine

## 2022-12-30 ENCOUNTER — Other Ambulatory Visit: Payer: Self-pay

## 2022-12-30 ENCOUNTER — Encounter: Payer: Self-pay | Admitting: Internal Medicine

## 2022-12-30 ENCOUNTER — Ambulatory Visit: Payer: BC Managed Care – PPO | Admitting: Internal Medicine

## 2022-12-30 VITALS — BP 122/74 | HR 66 | Ht 65.52 in | Wt 184.0 lb

## 2022-12-30 DIAGNOSIS — E1165 Type 2 diabetes mellitus with hyperglycemia: Secondary | ICD-10-CM

## 2022-12-30 DIAGNOSIS — Z794 Long term (current) use of insulin: Secondary | ICD-10-CM | POA: Diagnosis not present

## 2022-12-30 DIAGNOSIS — Z7984 Long term (current) use of oral hypoglycemic drugs: Secondary | ICD-10-CM

## 2022-12-30 DIAGNOSIS — E039 Hypothyroidism, unspecified: Secondary | ICD-10-CM

## 2022-12-30 LAB — POCT GLYCOSYLATED HEMOGLOBIN (HGB A1C): Hemoglobin A1C: 8.4 % — AB (ref 4.0–5.6)

## 2022-12-30 MED ORDER — FREESTYLE LIBRE 3 SENSOR MISC
2 refills | Status: DC
Start: 2022-12-30 — End: 2024-02-22

## 2022-12-30 MED ORDER — DAPAGLIFLOZIN PROPANEDIOL 10 MG PO TABS
10.0000 mg | ORAL_TABLET | Freq: Every day | ORAL | 3 refills | Status: DC
Start: 2022-12-30 — End: 2023-07-21

## 2022-12-30 MED ORDER — TRESIBA FLEXTOUCH 100 UNIT/ML ~~LOC~~ SOPN: 18.0000 [IU] | PEN_INJECTOR | Freq: Every day | SUBCUTANEOUS | 3 refills | Status: DC

## 2022-12-30 MED ORDER — TIRZEPATIDE 2.5 MG/0.5ML ~~LOC~~ SOAJ
2.5000 mg | SUBCUTANEOUS | 3 refills | Status: DC
Start: 2022-12-30 — End: 2023-07-21
  Filled 2022-12-30 – 2023-01-14 (×2): qty 2, 28d supply, fill #0
  Filled 2023-02-22: qty 2, 28d supply, fill #1
  Filled 2023-03-28: qty 2, 28d supply, fill #2
  Filled 2023-05-10: qty 2, 28d supply, fill #3
  Filled 2023-06-08: qty 2, 28d supply, fill #4
  Filled 2023-07-01 – 2023-07-04 (×3): qty 2, 28d supply, fill #5

## 2022-12-30 MED ORDER — BD PEN NEEDLE NANO 2ND GEN 32G X 4 MM MISC
1.0000 | Freq: Every day | 2 refills | Status: DC
Start: 2022-12-30 — End: 2024-02-22

## 2022-12-30 MED ORDER — TIRZEPATIDE 2.5 MG/0.5ML ~~LOC~~ SOAJ: 2.5000 mg | SUBCUTANEOUS | 3 refills | Status: DC

## 2022-12-30 MED ORDER — LEVOTHYROXINE SODIUM 75 MCG PO TABS
75.0000 ug | ORAL_TABLET | Freq: Every day | ORAL | 2 refills | Status: DC
Start: 2022-12-30 — End: 2023-07-22

## 2022-12-30 NOTE — Patient Instructions (Signed)
-   Start Mounjaro 2.5 mg weekly  - Decrease Tresiba 18  units daily - Continue Farxiga 10 mg , 1 tablet in the morning     HOW TO TREAT LOW BLOOD SUGARS (Blood sugar LESS THAN 70 MG/DL) Please follow the RULE OF 15 for the treatment of hypoglycemia treatment (when your (blood sugars are less than 70 mg/dL)   STEP 1: Take 15 grams of carbohydrates when your blood sugar is low, which includes:  3-4 GLUCOSE TABS  OR 3-4 OZ OF JUICE OR REGULAR SODA OR ONE TUBE OF GLUCOSE GEL    STEP 2: RECHECK blood sugar in 15 MINUTES STEP 3: If your blood sugar is still low at the 15 minute recheck --> then, go back to STEP 1 and treat AGAIN with another 15 grams of carbohydrates

## 2022-12-31 ENCOUNTER — Other Ambulatory Visit: Payer: Self-pay

## 2023-01-07 DIAGNOSIS — E113512 Type 2 diabetes mellitus with proliferative diabetic retinopathy with macular edema, left eye: Secondary | ICD-10-CM | POA: Diagnosis not present

## 2023-01-12 DIAGNOSIS — H25812 Combined forms of age-related cataract, left eye: Secondary | ICD-10-CM | POA: Diagnosis not present

## 2023-01-13 ENCOUNTER — Telehealth: Payer: Self-pay

## 2023-01-13 ENCOUNTER — Other Ambulatory Visit (HOSPITAL_COMMUNITY): Payer: Self-pay

## 2023-01-13 NOTE — Telephone Encounter (Signed)
Pharmacy Patient Advocate Encounter   Received notification from CoverMyMeds that prior authorization for [facility] is required/requested.   Per test claim: PA required; PA submitted to [employer] via CoverMyMeds Key/confirmation #/[id number] Status is pending

## 2023-01-14 ENCOUNTER — Other Ambulatory Visit: Payer: Self-pay

## 2023-01-17 NOTE — Telephone Encounter (Signed)
Pharmacy Patient Advocate Encounter  Received notification from [facility] that Prior Authorization for Melanie Navarro has been APPROVED through 01/12/2024   PA #/Case ID/Reference #: [id number]

## 2023-01-20 DIAGNOSIS — U071 COVID-19: Secondary | ICD-10-CM | POA: Diagnosis not present

## 2023-01-20 DIAGNOSIS — Z6830 Body mass index (BMI) 30.0-30.9, adult: Secondary | ICD-10-CM | POA: Diagnosis not present

## 2023-01-20 DIAGNOSIS — R0981 Nasal congestion: Secondary | ICD-10-CM | POA: Diagnosis not present

## 2023-02-04 DIAGNOSIS — E113512 Type 2 diabetes mellitus with proliferative diabetic retinopathy with macular edema, left eye: Secondary | ICD-10-CM | POA: Diagnosis not present

## 2023-02-09 DIAGNOSIS — H25811 Combined forms of age-related cataract, right eye: Secondary | ICD-10-CM | POA: Diagnosis not present

## 2023-02-22 ENCOUNTER — Other Ambulatory Visit: Payer: Self-pay

## 2023-02-23 ENCOUNTER — Other Ambulatory Visit: Payer: Self-pay

## 2023-03-09 DIAGNOSIS — E113512 Type 2 diabetes mellitus with proliferative diabetic retinopathy with macular edema, left eye: Secondary | ICD-10-CM | POA: Diagnosis not present

## 2023-03-09 LAB — HM DIABETES EYE EXAM

## 2023-03-16 ENCOUNTER — Other Ambulatory Visit: Payer: Self-pay | Admitting: Internal Medicine

## 2023-03-28 ENCOUNTER — Other Ambulatory Visit: Payer: Self-pay

## 2023-03-29 DIAGNOSIS — H4312 Vitreous hemorrhage, left eye: Secondary | ICD-10-CM | POA: Diagnosis not present

## 2023-04-06 DIAGNOSIS — Z09 Encounter for follow-up examination after completed treatment for conditions other than malignant neoplasm: Secondary | ICD-10-CM | POA: Diagnosis not present

## 2023-05-06 DIAGNOSIS — E113512 Type 2 diabetes mellitus with proliferative diabetic retinopathy with macular edema, left eye: Secondary | ICD-10-CM | POA: Diagnosis not present

## 2023-05-10 ENCOUNTER — Other Ambulatory Visit: Payer: Self-pay

## 2023-05-23 ENCOUNTER — Ambulatory Visit: Payer: BC Managed Care – PPO | Admitting: Internal Medicine

## 2023-06-08 ENCOUNTER — Other Ambulatory Visit: Payer: Self-pay

## 2023-07-01 ENCOUNTER — Other Ambulatory Visit: Payer: Self-pay

## 2023-07-21 ENCOUNTER — Encounter: Payer: Self-pay | Admitting: Internal Medicine

## 2023-07-21 ENCOUNTER — Other Ambulatory Visit: Payer: Self-pay

## 2023-07-21 ENCOUNTER — Ambulatory Visit: Payer: BC Managed Care – PPO | Admitting: Internal Medicine

## 2023-07-21 VITALS — BP 124/70 | HR 68 | Ht 65.52 in | Wt 178.0 lb

## 2023-07-21 DIAGNOSIS — E039 Hypothyroidism, unspecified: Secondary | ICD-10-CM | POA: Diagnosis not present

## 2023-07-21 DIAGNOSIS — E785 Hyperlipidemia, unspecified: Secondary | ICD-10-CM

## 2023-07-21 DIAGNOSIS — E1165 Type 2 diabetes mellitus with hyperglycemia: Secondary | ICD-10-CM

## 2023-07-21 DIAGNOSIS — Z794 Long term (current) use of insulin: Secondary | ICD-10-CM

## 2023-07-21 DIAGNOSIS — Z7985 Long-term (current) use of injectable non-insulin antidiabetic drugs: Secondary | ICD-10-CM | POA: Diagnosis not present

## 2023-07-21 DIAGNOSIS — Z7984 Long term (current) use of oral hypoglycemic drugs: Secondary | ICD-10-CM

## 2023-07-21 LAB — POCT GLYCOSYLATED HEMOGLOBIN (HGB A1C): Hemoglobin A1C: 6 % — AB (ref 4.0–5.6)

## 2023-07-21 MED ORDER — DAPAGLIFLOZIN PROPANEDIOL 10 MG PO TABS: 10.0000 mg | ORAL_TABLET | Freq: Every day | ORAL | 3 refills | Status: DC

## 2023-07-21 MED ORDER — TIRZEPATIDE 5 MG/0.5ML ~~LOC~~ SOAJ
5.0000 mg | SUBCUTANEOUS | 3 refills | Status: DC
Start: 2023-07-21 — End: 2024-02-22
  Filled 2023-07-21: qty 6, 84d supply, fill #0
  Filled 2023-10-19: qty 2, 28d supply, fill #0
  Filled 2023-10-24 – 2023-10-31 (×2): qty 6, 84d supply, fill #0
  Filled 2023-11-07: qty 2, 28d supply, fill #0
  Filled 2023-12-05: qty 2, 28d supply, fill #1
  Filled 2024-01-10: qty 2, 28d supply, fill #2
  Filled 2024-02-08: qty 2, 28d supply, fill #3

## 2023-07-21 MED ORDER — TRESIBA FLEXTOUCH 100 UNIT/ML ~~LOC~~ SOPN: 10.0000 [IU] | PEN_INJECTOR | Freq: Every day | SUBCUTANEOUS | Status: DC

## 2023-07-21 MED ORDER — TIRZEPATIDE 5 MG/0.5ML ~~LOC~~ SOAJ: 5.0000 mg | SUBCUTANEOUS | 3 refills | Status: DC

## 2023-07-21 NOTE — Patient Instructions (Signed)
-   Increase  Mounjaro 5 mg weekly  - Decrease Tresiba 10  units daily ( If fasting sugars are less than 100 , STOP Tresiba)  - Continue Farxiga 10 mg , 1 tablet in the morning     HOW TO TREAT LOW BLOOD SUGARS (Blood sugar LESS THAN 70 MG/DL) Please follow the RULE OF 15 for the treatment of hypoglycemia treatment (when your (blood sugars are less than 70 mg/dL)   STEP 1: Take 15 grams of carbohydrates when your blood sugar is low, which includes:  3-4 GLUCOSE TABS  OR 3-4 OZ OF JUICE OR REGULAR SODA OR ONE TUBE OF GLUCOSE GEL    STEP 2: RECHECK blood sugar in 15 MINUTES STEP 3: If your blood sugar is still low at the 15 minute recheck --> then, go back to STEP 1 and treat AGAIN with another 15 grams of carbohydrates

## 2023-07-21 NOTE — Progress Notes (Unsigned)
 Name: Melanie Navarro  MRN/ DOB: [id number], 1960/11/26   Age/ Sex: 63 y.o., female    PCP: Doreene Nest, NP   Reason for Endocrinology Evaluation: Type 2 Diabetes Mellitus     Date of Initial Endocrinology Visit: 07/16/2020    PATIENT IDENTIFIER: Melanie Navarro is a 63 y.o. female with a past medical history of T2DM, HTN , hypothyroidism and Dyslipidemia. The patient presented for initial endocrinology clinic visit on 07/16/2020 for consultative assistance with her diabetes management.    DIABETIC HISTORY:  Melanie Navarro  was diagnosed with DM many years ago. Glipizide - possible  rash . Intolerant to higher doses of metformin.  Her hemoglobin A1c has ranged from 8.2% in 2016, peaking at 12.8% in 2018.  On her initial visit to our clinic her A1c is 10.8 % she was on Levemir , Metformin and Jardiance, we witched her to [place] and started Trulicity as well as continuing with Metformin and Jardiance   She was lost to follow-up in 2023 and when she returned she had not been on Trulicity and I started her on Mounjaro 12/2022   THYROID HISTORY : Has been diagnosed with hypothyroidism ~ 2020 Has been on Lt-4 since .  She is on Biotin  No local neck symptoms   NO Fh of thyroid disease    SUBJECTIVE:   During the last visit (12/30/2022): A1c 8.4%     Today (07/16/2020): [name] is here for a follow up on diabetes management. She has NOT been to our clinic in 7 months.She  checks her blood sugars  multiple times daily, through freestyle libre 3.  The patient has been noted with hypoglycemia.   Denies  vomiting  but has a few episodes of nausea due to over eating  Denies constipation but also had occasional diarrhea     HOME ENDOCRINE  REGIMEN: Tresiba  18 units  at night  Farxiga 10 mg daliy  Mounjaro 2.5 mg weekly  Levothyroxine 75 mcg daily     Statin: yes ACE-I/ARB: yes Prior Diabetic Education: no   CONTINUOUS GLUCOSE MONITORING RECORD INTERPRETATION    Dates of  Recording: 3/7-3/20/2025  Sensor description: [employer]  Results statistics:   CGM use % of time 96  Average and SD 128/28.8  Time in range  90 %  % Time Above 180 10  % Time above 250 0  % Time Below target 0   Glycemic patterns summary: BG's trend down overnight and fluctuate during the day  Hyperglycemic episodes postprandial  Hypoglycemic episodes occurred overnight  Overnight periods: Trends down    DIABETIC COMPLICATIONS: Microvascular complications:  Left eye DR ? Denies: CKD, neuropathy  Last eye exam: Completed 11/2022  Macrovascular complications:   Denies: CAD, PVD, CVA   PAST HISTORY: Past Medical History:  Past Medical History:  Diagnosis Date   Diabetes mellitus    History of echocardiogram    a. 07/2019 Echo: EF 60-65%. no rwma. Nl RV size/fxn. Mild Ao sclerosis w/o stenosis.    Hyperlipidemia    Hypertension    Nocturnal Bradycardia    a. 06/2019 Zio: Mobitz I progressing to CHB w/ up to 3.6 sec pause (2 episodes)-->sleep study pending.   Palpitations    a. 06/2019 Zio: Avg HR 72 (max/min 38/135). Rare PACs/PVCs. 9 atrial runs up to 9 beat/max 135 bpm. 2 brief episodes of Mobitz I progressing to CHB w/ longest pause of 3.7 secs - episodes occrred overnight/early morning hrs.   Past  Surgical History:  Past Surgical History:  Procedure Laterality Date   WISDOM TOOTH EXTRACTION  1984    Social History:  reports that she has never smoked. She has never used smokeless tobacco. She reports that she does not currently use alcohol. She reports that she does not use drugs. Family History:  Family History  Problem Relation Age of Onset   Stroke Mother    Hypertension Mother    Heart disease Mother    Diabetes Mother    Peripheral Artery Disease Mother    Diabetes Father    Lung cancer Father    Diabetes Brother    Heart attack Brother 41   Osteoarthritis Paternal Grandmother    Colon cancer Neg Hx      HOME MEDICATIONS: Allergies as of  07/21/2023       Reactions   Elemental Sulfur    Rash   Glipizide    dizziness   Penicillins    Rash   Codeine Swelling, Rash        Medication List        Accurate as of July 21, 2023  8:35 AM. If you have any questions, ask your nurse or doctor.          aspirin 81 MG tablet Take 81 mg by mouth daily.   BD Pen Needle Nano 2nd Gen 32G X 4 MM Misc Generic drug: Insulin Pen Needle 1 Device by Other route daily in the afternoon. USE DAILY AS DIRECTED   carvedilol 12.5 MG tablet Commonly known as: COREG TAKE 1 TABLET (12.5 MG TOTAL) BY MOUTH 2 (TWO) TIMES DAILY WITH A MEAL. FOR BLOOD PRESSURE.   cyclobenzaprine 10 MG tablet Commonly known as: FLEXERIL Take 1 tablet (10 mg total) by mouth 3 (three) times daily as needed.   dapagliflozin propanediol 10 MG Tabs tablet Commonly known as: Farxiga Take 1 tablet (10 mg total) by mouth daily before breakfast.   ezetimibe 10 MG tablet Commonly known as: Zetia Take 1 tablet (10 mg total) by mouth daily. for cholesterol.   famotidine 20 MG tablet Commonly known as: PEPCID Take 1 tablet (20 mg total) by mouth daily as needed for heartburn or indigestion.   FreeStyle Libre 3 Sensor Misc Place 1 sensor on the skin every 14 days. Use to check glucose continuously   levothyroxine 75 MCG tablet Commonly known as: SYNTHROID Take 1 tablet (75 mcg total) by mouth daily before breakfast.   losartan-hydrochlorothiazide 100-25 MG tablet Commonly known as: HYZAAR TAKE 1 TABLET BY MOUTH DAILY FOR BLOOD PRESSURE   Mounjaro 2.5 MG/0.5ML Pen Generic drug: tirzepatide Inject 2.5 mg into the skin once a week.   onetouch ultrasoft lancets Use as instructed   OneTouch Verio test strip Generic drug: glucose blood Twice dAILY   rosuvastatin 10 MG tablet Commonly known as: CRESTOR Take by mouth.   Evaristo Bury FlexTouch 100 UNIT/ML FlexTouch Pen Generic drug: insulin degludec Inject 18 Units into the skin daily.          ALLERGIES: Allergies  Allergen Reactions   Elemental Sulfur     Rash    Glipizide     dizziness   Penicillins     Rash    Codeine Swelling and Rash     REVIEW OF SYSTEMS: A comprehensive ROS was conducted with the patient and is negative except as per HPI     OBJECTIVE:   VITAL SIGNS: BP 124/70 (BP Location: Left Arm, Patient Position: Sitting, Cuff Size: Small)  Pulse 68   Ht 5' 5.52" (1.664 m)   Wt 178 lb (80.7 kg)   SpO2 99%   BMI 29.15 kg/m    PHYSICAL EXAM:  General: Pt appears well and is in NAD  Neck: General: Supple without adenopathy or carotid bruits. Thyroid: Thyroid size normal.  No goiter or nodules appreciated.  Lungs: Clear with good BS bilat   Heart: RRR   Abdomen: Soft, nontender  Extremities:  Lower extremities - No pretibial edema.   Neuro: MS is good with appropriate affect, pt is alert and Ox3    DM foot exam: 12/30/2022  The skin of the feet is intact without sores or ulcerations. The pedal pulses are 2+ on right and 2+ on left. The sensation is intact to a screening 5.07, 10 gram monofilament bilaterally   DATA REVIEWED:  Lab Results  Component Value Date   HGBA1C 6.0 (A) 07/21/2023   HGBA1C 8.4 (A) 12/30/2022   HGBA1C 6.6 (A) 03/25/2021    Latest Reference Range & Units 09/24/22 11:45  Sodium 135 - 145 mEq/L 138  Potassium 3.5 - 5.1 mEq/L 4.2  Chloride 96 - 112 mEq/L 100  CO2 19 - 32 mEq/L 27  Glucose 70 - 99 mg/dL 161 (H)  BUN 6 - 23 mg/dL 26 (H)  Creatinine 0.96 - 1.20 mg/dL 0.45  Calcium 8.4 - 40.9 mg/dL 9.4  Alkaline Phosphatase 39 - 117 U/L 92  Albumin 3.5 - 5.2 g/dL 4.4  AST 0 - 37 U/L 15  ALT 0 - 35 U/L 16  Total Protein 6.0 - 8.3 g/dL 7.3  Total Bilirubin 0.2 - 1.2 mg/dL 0.8  GFR >81.19 mL/min 58.76 (L)    Latest Reference Range & Units 09/24/22 11:45  Total CHOL/HDL Ratio  3  Cholesterol 0 - 200 mg/dL 147  HDL Cholesterol >82.95 mg/dL 62.13  LDL (calc) 0 - 99 mg/dL 99  NonHDL  086.57  Triglycerides  0.0 - 149.0 mg/dL 846.9  VLDL 0.0 - 62.9 mg/dL 52.8    Latest Reference Range & Units 09/24/22 11:45  TSH 0.35 - 5.50 uIU/mL 4.52    ASSESSMENT / PLAN / RECOMMENDATIONS:   1) Type 2 Diabetes Mellitus,Optimally controlled, Without complications - Most recent A1c of 6.0 %. Goal A1c < 7.0 %.     -A1c has decreased from 8.4% to 6.0% -We switch Trulicity to [facility] 2024 due to insurance issues - Intolerant to Metformin XR -I will decrease insulin due to hypoglycemia, I have asked her to discontinue insulin should her fasting BG's remain below 100 Mg/DL  MEDICATIONS:  - Decrease Tresiba 10 units daily - Continue Farxiga  10 mg , 1 tablet in the morning  -Increase Mounjaro 5 mg weekly  EDUCATION / INSTRUCTIONS: BG monitoring instructions: Patient is instructed to check her blood sugars 1 times a day, fasting  Call [facility] clinic if: BG persistently < 70  I reviewed the Rule of 15 for the treatment of hypoglycemia in detail with the patient. Literature supplied.   2) Diabetic complications:  Eye: Does not have known diabetic retinopathy.  Neuro/ Feet: Does not have known diabetic peripheral neuropathy. Renal: Patient does not have known baseline CKD. She is on an ACEI/ARB at present.      3) Dyslipidemia:   -LDL has trended down to 99 mg/DL 08/1322 - She is intolerant to pravastatin due to myalgia , I have asked her to start OTC vitamin D -She is not on Zetia as it has been cost prohibitive -We  discussed cardiovascular benefits of statin therapy  4) Hypothyroidism :  -TSH ***   Medication  Continue Levothyroxine 75 mcg daily    F/U in 6 months   Signed electronically by: Lyndle Herrlich, MD  [facility] Endocrinology  [facility] [address] [place] [place], [place] [address] Phone: [phone] FAX: [phone]   CC: Doreene Nest, NP [address] [place] [address] Phone: [phone]  Fax:  [phone]    Return to Endocrinology clinic as below: No future appointments.

## 2023-07-22 ENCOUNTER — Encounter: Payer: Self-pay | Admitting: Internal Medicine

## 2023-07-22 ENCOUNTER — Other Ambulatory Visit: Payer: Self-pay

## 2023-07-22 MED ORDER — ROSUVASTATIN CALCIUM 5 MG PO TABS: 5.0000 mg | ORAL_TABLET | Freq: Every day | ORAL | 3 refills | Status: DC

## 2023-07-22 MED ORDER — LEVOTHYROXINE SODIUM 75 MCG PO TABS
75.0000 ug | ORAL_TABLET | Freq: Every day | ORAL | 3 refills | Status: DC
Start: 2023-07-22 — End: 2024-02-22

## 2023-07-22 MED ORDER — VITAMIN D3 50 MCG (2000 UT) PO CAPS: 2000.0000 [IU] | ORAL_CAPSULE | Freq: Every day | ORAL | 3 refills | Status: AC

## 2023-07-23 LAB — VITAMIN D 25 HYDROXY (VIT D DEFICIENCY, FRACTURES): Vit D, 25-Hydroxy: 27 ng/mL — ABNORMAL LOW (ref 30–100)

## 2023-07-23 LAB — BASIC METABOLIC PANEL WITH GFR
BUN/Creatinine Ratio: 20 (calc) (ref 6–22)
BUN: 22 mg/dL (ref 7–25)
CO2: 31 mmol/L (ref 20–32)
Calcium: 9.7 mg/dL (ref 8.6–10.4)
Chloride: 103 mmol/L (ref 98–110)
Creat: 1.1 mg/dL — ABNORMAL HIGH (ref 0.50–1.05)
Glucose, Bld: 71 mg/dL (ref 65–99)
Potassium: 4.4 mmol/L (ref 3.5–5.3)
Sodium: 142 mmol/L (ref 135–146)

## 2023-07-23 LAB — LIPID PANEL
Cholesterol: 216 mg/dL — ABNORMAL HIGH (ref <200)
HDL: 75 mg/dL (ref >=50)
LDL Cholesterol (Calc): 122 mg/dL — ABNORMAL HIGH
Non-HDL Cholesterol (Calc): 141 mg/dL — ABNORMAL HIGH (ref <130)
Total CHOL/HDL Ratio: 2.9 (calc) (ref <5.0)
Triglycerides: 93 mg/dL (ref <150)

## 2023-07-23 LAB — MICROALBUMIN / CREATININE URINE RATIO
Creatinine, Urine: 59 mg/dL (ref 20–275)
Microalb Creat Ratio: 3 mg/g{creat} (ref <30)
Microalb, Ur: 0.2 mg/dL

## 2023-07-23 LAB — TEST AUTHORIZATION

## 2023-07-23 LAB — BUN/CREATININE RATIO
BUN/Creatinine Ratio: 20 (calc) (ref 6–22)
BUN: 22 mg/dL (ref 7–25)
Creat: 1.1 mg/dL — ABNORMAL HIGH (ref 0.50–1.05)
eGFR: 56 mL/min/{1.73_m2} — ABNORMAL LOW (ref >=60)

## 2023-07-23 LAB — TSH: TSH: 2.76 m[IU]/L (ref 0.40–4.50)

## 2023-09-03 ENCOUNTER — Other Ambulatory Visit: Payer: Self-pay | Admitting: Primary Care

## 2023-09-03 DIAGNOSIS — I10 Essential (primary) hypertension: Secondary | ICD-10-CM

## 2023-09-03 DIAGNOSIS — R002 Palpitations: Secondary | ICD-10-CM

## 2023-09-04 NOTE — Telephone Encounter (Signed)
Patient is due for CPE/follow up in late May, this will be required prior to any further refills.  Please schedule, thank you!   

## 2023-09-05 NOTE — Telephone Encounter (Signed)
 Lvmtcb, sent mychart message

## 2023-09-06 NOTE — Telephone Encounter (Signed)
 Lvmtcb

## 2023-09-07 NOTE — Telephone Encounter (Signed)
 Lvmtcb

## 2023-09-27 ENCOUNTER — Other Ambulatory Visit: Payer: Self-pay | Admitting: Primary Care

## 2023-09-27 DIAGNOSIS — K219 Gastro-esophageal reflux disease without esophagitis: Secondary | ICD-10-CM

## 2023-09-27 NOTE — Telephone Encounter (Signed)
 Patient is due for CPE/follow up, this will be required prior to any further refills.  Please schedule, thank you!

## 2023-09-27 NOTE — Telephone Encounter (Signed)
 lvm for pt to call office to schedule appt.

## 2023-09-28 NOTE — Telephone Encounter (Signed)
 Noted

## 2023-09-28 NOTE — Telephone Encounter (Signed)
 Spoke to pt, pt states she recently retired on 08/31/23 & has to currently wait until her husband's insurance to began, which should be beginning of June. Pt scheduled cpe for 10/25/23

## 2023-10-06 ENCOUNTER — Telehealth: Payer: Self-pay | Admitting: Primary Care

## 2023-10-06 ENCOUNTER — Other Ambulatory Visit: Payer: Self-pay | Admitting: Primary Care

## 2023-10-06 DIAGNOSIS — R002 Palpitations: Secondary | ICD-10-CM

## 2023-10-06 MED ORDER — CARVEDILOL 12.5 MG PO TABS: 12.5000 mg | ORAL_TABLET | Freq: Two times a day (BID) | ORAL | 0 refills | Status: DC

## 2023-10-06 NOTE — Telephone Encounter (Signed)
 Copied from CRM [id number]. Topic: Clinical - Medication Refill >> Oct 06, 2023 10:08 AM Minus Amel G wrote: Medication: carvedilol  (COREG ) 12.5 MG tablet  Has the patient contacted their pharmacy? Yes (Agent: If no, request that the patient contact the pharmacy for the refill. If patient does not wish to contact the pharmacy document the reason why and proceed with request.) (Agent: If yes, when and what did the pharmacy advise?)  This is the patient's preferred pharmacy:  [id number] [place], [place] - [address] [place] [address] Phone: [phone] Fax: [phone]   Is this the correct pharmacy for this prescription? Yes If no, delete pharmacy and type the correct one.   Has the prescription been filled recently? No  Is the patient out of the medication? No after today 2 left  Has the patient been seen for an appointment in the last year OR does the patient have an upcoming appointment? Yes  Can we respond through MyChart? No  Agent: Please be advised that Rx refills may take up to 3 business days. We ask that you follow-up with your pharmacy.

## 2023-10-06 NOTE — Telephone Encounter (Signed)
 Please notify patient that I will refill the prescription for 1 more month but we will need to see her in the office before further refills can be prescribed.  I see that she is scheduled for 10/25/2023.  She will need to keep this appointment.

## 2023-10-06 NOTE — Telephone Encounter (Unsigned)
 Copied from CRM [id number]. Topic: Clinical - Medication Refill >> Oct 06, 2023 10:08 AM Minus Amel G wrote: Medication: carvedilol  (COREG ) 12.5 MG tablet  Has the patient contacted their pharmacy? Yes (Agent: If no, request that the patient contact the pharmacy for the refill. If patient does not wish to contact the pharmacy document the reason why and proceed with request.) (Agent: If yes, when and what did the pharmacy advise?)  This is the patient's preferred pharmacy:  [id number] [place], [place] - [address] [place] [address] Phone: [phone] Fax: [phone]   Is this the correct pharmacy for this prescription? Yes If no, delete pharmacy and type the correct one.   Has the prescription been filled recently? No  Is the patient out of the medication? No after today 2 left  Has the patient been seen for an appointment in the last year OR does the patient have an upcoming appointment? Yes  Can we respond through MyChart? No  Agent: Please be advised that Rx refills may take up to 3 business days. We ask that you follow-up with your pharmacy.

## 2023-10-06 NOTE — Telephone Encounter (Signed)
 Unable to reach patient. Left voicemail to return call to our office.

## 2023-10-07 NOTE — Telephone Encounter (Signed)
 Unable to reach patient. Left voicemail to return call to our office.

## 2023-10-10 NOTE — Telephone Encounter (Signed)
 Left detailed message per dpr advising of [employer].

## 2023-10-19 ENCOUNTER — Other Ambulatory Visit: Payer: Self-pay

## 2023-10-24 ENCOUNTER — Other Ambulatory Visit: Payer: Self-pay

## 2023-10-24 ENCOUNTER — Telehealth: Payer: Self-pay

## 2023-10-24 NOTE — Telephone Encounter (Signed)
 Attempted to contact patient to get new insurance information, so PA can be processed on the Mounjaro . There was know answer and vm is full.

## 2023-10-25 ENCOUNTER — Ambulatory Visit: Payer: Self-pay | Admitting: Primary Care

## 2023-10-25 ENCOUNTER — Encounter: Payer: Self-pay | Admitting: Primary Care

## 2023-10-25 ENCOUNTER — Ambulatory Visit (INDEPENDENT_AMBULATORY_CARE_PROVIDER_SITE_OTHER): Payer: Self-pay | Admitting: Primary Care

## 2023-10-25 VITALS — BP 124/68 | HR 65 | Temp 97.8°F | Ht 65.52 in | Wt 173.0 lb

## 2023-10-25 DIAGNOSIS — I10 Essential (primary) hypertension: Secondary | ICD-10-CM

## 2023-10-25 DIAGNOSIS — K219 Gastro-esophageal reflux disease without esophagitis: Secondary | ICD-10-CM | POA: Diagnosis not present

## 2023-10-25 DIAGNOSIS — Z1231 Encounter for screening mammogram for malignant neoplasm of breast: Secondary | ICD-10-CM

## 2023-10-25 DIAGNOSIS — Z23 Encounter for immunization: Secondary | ICD-10-CM

## 2023-10-25 DIAGNOSIS — G8929 Other chronic pain: Secondary | ICD-10-CM

## 2023-10-25 DIAGNOSIS — E785 Hyperlipidemia, unspecified: Secondary | ICD-10-CM

## 2023-10-25 DIAGNOSIS — E1165 Type 2 diabetes mellitus with hyperglycemia: Secondary | ICD-10-CM

## 2023-10-25 DIAGNOSIS — M545 Low back pain, unspecified: Secondary | ICD-10-CM

## 2023-10-25 DIAGNOSIS — Z0000 Encounter for general adult medical examination without abnormal findings: Secondary | ICD-10-CM | POA: Diagnosis not present

## 2023-10-25 DIAGNOSIS — E039 Hypothyroidism, unspecified: Secondary | ICD-10-CM

## 2023-10-25 DIAGNOSIS — Z794 Long term (current) use of insulin: Secondary | ICD-10-CM

## 2023-10-25 DIAGNOSIS — Z1211 Encounter for screening for malignant neoplasm of colon: Secondary | ICD-10-CM

## 2023-10-25 LAB — HEPATIC FUNCTION PANEL
ALT: 15 U/L (ref 0–35)
AST: 16 U/L (ref 0–37)
Albumin: 4.2 g/dL (ref 3.5–5.2)
Alkaline Phosphatase: 64 U/L (ref 39–117)
Bilirubin, Direct: 0.1 mg/dL (ref 0.0–0.3)
Total Bilirubin: 0.6 mg/dL (ref 0.2–1.2)
Total Protein: 6.7 g/dL (ref 6.0–8.3)

## 2023-10-25 LAB — LIPID PANEL
Cholesterol: 168 mg/dL (ref 0–200)
HDL: 67.7 mg/dL (ref >39.00)
LDL Cholesterol: 79 mg/dL (ref 0–99)
NonHDL: 100.48
Total CHOL/HDL Ratio: 2
Triglycerides: 109 mg/dL (ref 0.0–149.0)
VLDL: 21.8 mg/dL (ref 0.0–40.0)

## 2023-10-25 MED ORDER — CYCLOBENZAPRINE HCL 10 MG PO TABS: 10.0000 mg | ORAL_TABLET | Freq: Three times a day (TID) | ORAL | 0 refills | Status: AC | PRN

## 2023-10-25 NOTE — Progress Notes (Signed)
 Subjective:    Patient ID: Melanie Navarro, female    DOB: [birth year], 63 y.o.   MRN: [id number]  HPI  Melanie Navarro is a very pleasant 63 y.o. female who presents today for complete physical and follow up of chronic conditions.  Immunizations: -Tetanus: Completed in 2023  -Shingles: Completed Shingrix  vaccine x 1 dose -Pneumonia: Completed last in 2016  Diet: Fair diet.  Exercise: Regular exercise.  Eye exam: Completes annually  Dental exam: Completes semi-annually    Pap Smear: Completed >5 years ago, declines  Mammogram: Completed in February 2023  Colonoscopy: Completed in 2017, due.  Did not complete last year referred.   BP Readings from Last 3 Encounters:  10/25/23 124/68  07/21/23 124/70  12/30/22 122/74      Review of Systems  Constitutional:  Negative for unexpected weight change.  HENT:  Negative for rhinorrhea.   Respiratory:  Negative for cough and shortness of breath.   Cardiovascular:  Negative for chest pain.  Gastrointestinal:  Negative for constipation and diarrhea.  Genitourinary:  Negative for difficulty urinating.  Musculoskeletal:  Positive for arthralgias.  Skin:  Negative for rash.  Allergic/Immunologic: Negative for environmental allergies.  Neurological:  Negative for dizziness, numbness and headaches.  Psychiatric/Behavioral:  The patient is not nervous/anxious.          Past Medical History:  Diagnosis Date   Diabetes mellitus    History of echocardiogram    a. 07/2019 Echo: EF 60-65%. no rwma. Nl RV size/fxn. Mild Ao sclerosis w/o stenosis.    Hyperlipidemia    Hypertension    Nocturnal Bradycardia    a. 06/2019 Zio: Mobitz I progressing to CHB w/ up to 3.6 sec pause (2 episodes)-->sleep study pending.   Palpitations    a. 06/2019 Zio: Avg HR 72 (max/min 38/135). Rare PACs/PVCs. 9 atrial runs up to 9 beat/max 135 bpm. 2 brief episodes of Mobitz I progressing to CHB w/ longest pause of 3.7 secs - episodes occrred overnight/early  morning hrs.    Social History   Socioeconomic History   Marital status: Married    Spouse name: Not on file   Number of children: Not on file   Years of education: Not on file   Highest education level: 12th grade  Occupational History   Not on file  Tobacco Use   Smoking status: Never   Smokeless tobacco: Never  Vaping Use   Vaping status: Never Used  Substance and Sexual Activity   Alcohol use: Not Currently    Comment: 1 drink per month   Drug use: No   Sexual activity: Not on file  Other Topics Concern   Not on file  Social History Narrative   Married.   2 children. 1 grandchild.   Works in Photographer.   Enjoys gardening, flowers, spending time with family.   Social Drivers of Corporate investment banker Strain: Low Risk  (09/23/2022)   Overall Financial Resource Strain (CARDIA)    Difficulty of Paying Living Expenses: Not very hard  Food Insecurity: No Food Insecurity (09/23/2022)   Hunger Vital Sign    Worried About Running Out of Food in the Last Year: Never true    Ran Out of Food in the Last Year: Never true  Transportation Needs: No Transportation Needs (09/23/2022)   PRAPARE - Administrator, Civil Service (Medical): No    Lack of Transportation (Non-Medical): No  Physical Activity: Insufficiently Active (09/23/2022)   Exercise Vital Sign  Days of Exercise per Week: 1 day    Minutes of Exercise per Session: 10 min  Stress: Stress Concern Present (09/23/2022)   [employer] of Occupational Health - Occupational Stress Questionnaire    Feeling of Stress : To some extent  Social Connections: Socially Integrated (09/23/2022)   Social Connection and Isolation Panel    Frequency of Communication with Friends and Family: Twice a week    Frequency of Social Gatherings with Friends and Family: Twice a week    Attends Religious Services: 1 to 4 times per year    Active Member of [employer] or Organizations: Yes    Attends Banker Meetings: 1  to 4 times per year    Marital Status: Married  Catering manager Violence: Unknown (08/07/2021)   Received from [facility]   HITS    Physically Hurt: Not on file    Insult or Talk Down To: Not on file    Threaten Physical Harm: Not on file    Scream or Curse: Not on file    Past Surgical History:  Procedure Laterality Date   WISDOM TOOTH EXTRACTION  1984    Family History  Problem Relation Age of Onset   Stroke Mother    Hypertension Mother    Heart disease Mother    Diabetes Mother    Peripheral Artery Disease Mother    Diabetes Father    Lung cancer Father    Diabetes Brother    Heart attack Brother 69   Osteoarthritis Paternal Grandmother    Colon cancer Neg Hx     Allergies  Allergen Reactions   Elemental Sulfur     Rash    Glipizide      dizziness   Penicillins     Rash    Codeine Swelling and Rash    Current Outpatient Medications on File Prior to Visit  Medication Sig Dispense Refill   aspirin 81 MG tablet Take 81 mg by mouth daily.     carvedilol  (COREG ) 12.5 MG tablet Take 1 tablet (12.5 mg total) by mouth 2 (two) times daily with a meal. For blood pressure. 60 tablet 0   Cholecalciferol (VITAMIN D3) 50 MCG (2000 UT) capsule Take 1 capsule (2,000 Units total) by mouth daily. 90 capsule 3   Continuous Glucose Sensor (FREESTYLE LIBRE 3 SENSOR) MISC Place 1 sensor on the skin every 14 days. Use to check glucose continuously 6 each 2   dapagliflozin  propanediol (FARXIGA ) 10 MG TABS tablet Take 1 tablet (10 mg total) by mouth daily before breakfast. 90 tablet 3   famotidine  (PEPCID ) 20 MG tablet TAKE 1 TABLET (20 MG TOTAL) BY MOUTH DAILY AS NEEDED FOR HEARTBURN OR INDIGESTION. 30 tablet 0   glucose blood (ONETOUCH VERIO) test strip Twice dAILY 100 each 12   insulin  degludec (TRESIBA  FLEXTOUCH) 100 UNIT/ML FlexTouch Pen Inject 10 Units into the skin daily.     Insulin  Pen Needle (BD PEN NEEDLE NANO 2ND GEN) 32G X 4 MM MISC 1 Device by Other route daily in the  afternoon. USE DAILY AS DIRECTED 100 each 2   Lancets (ONETOUCH ULTRASOFT) lancets Use as instructed 30 each 1   levothyroxine  (SYNTHROID ) 75 MCG tablet Take 1 tablet (75 mcg total) by mouth daily before breakfast. 90 tablet 3   losartan -hydrochlorothiazide (HYZAAR) 100-25 MG tablet TAKE 1 TABLET BY MOUTH EVERY DAY FOR BLOOD PRESSURE 30 tablet 0   rosuvastatin  (CRESTOR ) 5 MG tablet Take 1 tablet (5 mg total) by mouth daily. 90  tablet 3   tirzepatide  (MOUNJARO ) 5 MG/0.5ML Pen Inject 5 mg into the skin once a week. 6 mL 3   No current facility-administered medications on file prior to visit.    BP 124/68   Pulse 65   Temp 97.8 F (36.6 C) (Temporal)   Ht 5' 5.52 (1.664 m)   Wt 173 lb (78.5 kg)   SpO2 99%   BMI 28.33 kg/m  Objective:   Physical Exam HENT:     Right Ear: Tympanic membrane and ear canal normal.     Left Ear: Tympanic membrane and ear canal normal.   Eyes:     Pupils: Pupils are equal, round, and reactive to light.    Cardiovascular:     Rate and Rhythm: Normal rate and regular rhythm.  Pulmonary:     Effort: Pulmonary effort is normal.     Breath sounds: Normal breath sounds.  Abdominal:     General: Bowel sounds are normal.     Palpations: Abdomen is soft.     Tenderness: There is no abdominal tenderness.   Musculoskeletal:        General: Normal range of motion.     Cervical back: Neck supple.   Skin:    General: Skin is warm and dry.   Neurological:     Mental Status: She is alert and oriented to person, place, and time.     Cranial Nerves: No cranial nerve deficit.     Deep Tendon Reflexes:     Reflex Scores:      Patellar reflexes are 2+ on the right side and 2+ on the left side.  Psychiatric:        Mood and Affect: Mood normal.           Assessment & Plan:  Encounter for preventive health examination Assessment & Plan: Second Shingrix  vaccine provided today. Pap smear overdue, she continues to decline despite  recommendations. Mammogram due, orders placed. Colonoscopy due, referral placed to GI.  Discussed the importance of a healthy diet and regular exercise in order for weight loss, and to reduce the risk of further co-morbidity.  Exam stable. Labs pending.  Follow up in 1 year for repeat physical.    Essential hypertension Assessment & Plan: Controlled.  Continue carvedilol  12.5 mg BID, losartan -hydrochlorothiazide 100-25 mcg daily. BMP reviewed from March 2025   Gastroesophageal reflux disease, unspecified whether esophagitis present Assessment & Plan: Stable.  Continue famotidine  20 mg daily.   Hyperlipidemia, unspecified hyperlipidemia type Assessment & Plan: Continue rosuvastatin  5 mg daily. Repeat lipid panel today  Orders: -     Lipid panel -     Hepatic function panel  Chronic bilateral low back pain without sciatica Assessment & Plan: Stable.  Continue cyclobenzaprine  10 mg as needed. Refill provided today.  Orders: -     Cyclobenzaprine  HCl; Take 1 tablet (10 mg total) by mouth 3 (three) times daily as needed for muscle spasms.  Dispense: 30 tablet; Refill: 0  Screening for colon cancer -     Ambulatory referral to Gastroenterology  Screening mammogram for breast cancer -     3D Screening Mammogram, Left and Right; Future  Type 2 diabetes mellitus with hyperglycemia, with long-term current use of insulin  (HCC) Assessment & Plan: Improved and controlled with A1C of 6.0!!! Follow with endocrinology.  Continue Mounjaro  5 mg weekly, Farxiga  10 mg daily, Tresiba  10 units as instructed. She will schedule her eye exam. Urine microalbumin up-to-date.     Acquired hypothyroidism  Assessment & Plan: Controlled.  Reviewed TSH from March 2025.  Per endocrinology. Continue levothyroxine  75 mcg daily.         Parrish Bonn K Endya Austin, NP

## 2023-10-25 NOTE — Assessment & Plan Note (Signed)
 Improved and controlled with A1C of 6.0!!! Follow with endocrinology.  Continue Mounjaro  5 mg weekly, Farxiga  10 mg daily, Tresiba  10 units as instructed. She will schedule her eye exam. Urine microalbumin up-to-date.

## 2023-10-25 NOTE — Assessment & Plan Note (Signed)
 Second Shingrix  vaccine provided today. Pap smear overdue, she continues to decline despite recommendations. Mammogram due, orders placed. Colonoscopy due, referral placed to GI.  Discussed the importance of a healthy diet and regular exercise in order for weight loss, and to reduce the risk of further co-morbidity.  Exam stable. Labs pending.  Follow up in 1 year for repeat physical.

## 2023-10-25 NOTE — Assessment & Plan Note (Signed)
 Continue rosuvastatin  5 mg daily. Repeat lipid panel today

## 2023-10-25 NOTE — Patient Instructions (Addendum)
 Stop by the lab prior to leaving today. I will notify you of your results once received.   [facility].   You will receive a phone call regarding the colonoscopy.  It was a pleasure to see you today!

## 2023-10-25 NOTE — Assessment & Plan Note (Signed)
 Controlled.  Reviewed TSH from March 2025.  Per endocrinology. Continue levothyroxine  75 mcg daily.

## 2023-10-25 NOTE — Assessment & Plan Note (Signed)
Stable. Continue famotidine 20mg daily

## 2023-10-25 NOTE — Assessment & Plan Note (Signed)
 Controlled.  Continue carvedilol  12.5 mg BID, losartan -hydrochlorothiazide 100-25 mcg daily. BMP reviewed from March 2025

## 2023-10-25 NOTE — Assessment & Plan Note (Signed)
 Stable.  Continue cyclobenzaprine  10 mg as needed. Refill provided today.

## 2023-10-26 ENCOUNTER — Other Ambulatory Visit (HOSPITAL_COMMUNITY): Payer: Self-pay

## 2023-10-28 ENCOUNTER — Other Ambulatory Visit: Payer: Self-pay | Admitting: Primary Care

## 2023-10-28 DIAGNOSIS — R002 Palpitations: Secondary | ICD-10-CM

## 2023-10-28 DIAGNOSIS — K219 Gastro-esophageal reflux disease without esophagitis: Secondary | ICD-10-CM

## 2023-10-28 DIAGNOSIS — I10 Essential (primary) hypertension: Secondary | ICD-10-CM

## 2023-10-31 ENCOUNTER — Other Ambulatory Visit: Payer: Self-pay

## 2023-11-02 ENCOUNTER — Other Ambulatory Visit (HOSPITAL_COMMUNITY): Payer: Self-pay

## 2023-11-02 NOTE — Telephone Encounter (Signed)
 Message was sent with patient new information for the Mounjaro  PA on 10/26/23. Please process PA asap.

## 2023-11-03 ENCOUNTER — Other Ambulatory Visit (HOSPITAL_COMMUNITY): Payer: Self-pay

## 2023-11-03 ENCOUNTER — Telehealth: Payer: Self-pay

## 2023-11-03 NOTE — Telephone Encounter (Signed)
 Pharmacy Patient Advocate Encounter   Received notification from Pt Calls Messages that prior authorization for Mounjaro  5mg  is required/requested.   Insurance verification completed.   The patient is insured through [facility] .   Per test claim: PA required; PA submitted to above mentioned insurance via Prompt PA Key/confirmation #/EOC [id number] Status is pending

## 2023-11-07 ENCOUNTER — Other Ambulatory Visit: Payer: Self-pay

## 2023-11-07 NOTE — Telephone Encounter (Signed)
 Pharmacy Patient Advocate Encounter  Received notification from [employer] that Prior Authorization for Mounjaro  5mg   has been APPROVED from 11/03/23 to 7/226

## 2023-11-08 ENCOUNTER — Other Ambulatory Visit: Payer: Self-pay

## 2023-12-05 ENCOUNTER — Other Ambulatory Visit: Payer: Self-pay

## 2023-12-28 LAB — HM DIABETES EYE EXAM

## 2024-01-04 ENCOUNTER — Ambulatory Visit: Payer: Self-pay

## 2024-01-04 ENCOUNTER — Other Ambulatory Visit (HOSPITAL_COMMUNITY): Payer: Self-pay

## 2024-01-04 NOTE — Telephone Encounter (Signed)
 Noted

## 2024-01-04 NOTE — Telephone Encounter (Signed)
 FYI Only or Action Required?: FYI only for provider.  Patient was last seen in primary care on 10/25/2023 by Gretta Comer POUR, NP.  Called Nurse Triage reporting Dysuria.  Symptoms began 1-2 weeks ago.  Interventions attempted: Rest, hydration, or home remedies.  Symptoms are: gradually worsening.  Triage Disposition: See Physician Within 24 Hours  Patient/caregiver understands and will follow disposition?: Yes        Copied from CRM #[id number]. Topic: Clinical - Red Word Triage >> Jan 04, 2024 10:42 AM Viola F wrote: Patient believes she has UTI - she's having burning/frequent urination and back pain         Reason for Disposition  Age > 50 years  Answer Assessment - Initial Assessment Questions 1. SEVERITY: How bad is the pain?  (e.g., Scale 1-10; mild, moderate, or severe)     Mild  3. PATTERN: Is pain present every time you urinate or just sometimes?      Intermittent  4. ONSET: When did the painful urination start?      1-2 weeks  5. FEVER: Do you have a fever? If Yes, ask: What is your temperature, how was it measured, and when did it start?     No 6. PAST UTI: Have you had a urine infection before? If Yes, ask: When was the last time? and What happened that time?      Yes, treated with antibiotics  7. CAUSE: What do you think is causing the painful urination?  (e.g., UTI, scratch, Herpes sore)     UTI 8. OTHER SYMPTOMS: Do you have any other symptoms? (e.g., blood in urine, flank pain, genital sores, urgency, vaginal discharge)     Increased frequency, lower back pain  Protocols used: Urination Pain - Female-A-AH

## 2024-01-05 ENCOUNTER — Ambulatory Visit (INDEPENDENT_AMBULATORY_CARE_PROVIDER_SITE_OTHER): Payer: Self-pay | Admitting: Family Medicine

## 2024-01-05 ENCOUNTER — Encounter: Payer: Self-pay | Admitting: Family Medicine

## 2024-01-05 VITALS — BP 122/70 | HR 73 | Temp 97.7°F | Ht 65.2 in | Wt 170.4 lb

## 2024-01-05 DIAGNOSIS — R3589 Other polyuria: Secondary | ICD-10-CM

## 2024-01-05 LAB — POCT URINALYSIS DIPSTICK
Bilirubin, UA: NEGATIVE
Blood, UA: NEGATIVE
Glucose, UA: POSITIVE — AB
Ketones, UA: NEGATIVE
Leukocytes, UA: NEGATIVE
Nitrite, UA: NEGATIVE
Protein, UA: NEGATIVE
Spec Grav, UA: <=1.005 — AB (ref 1.010–1.025)
Urobilinogen, UA: 0.2 U/dL
pH, UA: 7.5 (ref 5.0–8.0)

## 2024-01-05 NOTE — Assessment & Plan Note (Signed)
 Acute, recent presumed UTI treated via e-visit with Macrodantin x 5 days.  Patient states symptoms improved but did not resolve completely.  Now on the last few days she has had recurrence of symptoms although has been significantly better today.  Urinalysis unremarkable.  Will send urine for culture to make sure not partially treated UTI given very dilute sample.  Also recommended patient to push hydration especially water, consider cranberry tablets and avoid acidic foods/bladder irritation triggers.  Return and ER precautions provided.

## 2024-01-05 NOTE — Progress Notes (Signed)
 Patient ID: Melanie Navarro, female    DOB: [birth year], 63 y.o.   MRN: [id number]  This visit was conducted in person.  BP 122/70   Pulse 73   Temp 97.7 F (36.5 C) (Oral)   Ht 5' 5.2 (1.656 m)   Wt 170 lb 6 oz (77.3 kg)   SpO2 99%   BMI 28.18 kg/m    CC:  Chief Complaint  Patient presents with   Urinary Frequency    Pt C/o Urinary Frequency.  Started 12/30/23 Pt was given NITROFURANTOIN MONO-MCR 100 MG    Subjective:   HPI: Melanie Navarro is a 63 y.o. female presenting on 01/05/2024 for Urinary Frequency (Pt C/o Urinary Frequency.  Started 12/30/23 Pt was given NITROFURANTOIN MONO-MCR 100 MG)  Urinary Frequency  This is a new problem. The current episode started in the past 7 days. The problem occurs every urination. The problem has been waxing and waning. The quality of the pain is described as burning (mild in AM, some better now.). There has been no fever. She is Not sexually active. There is No history of pyelonephritis. Associated symptoms include frequency and urgency. Pertinent negatives include no chills, discharge, flank pain, hematuria, hesitancy, nausea, sweats or vomiting. Associated symptoms comments: Low back pressure, urine cloudy. She has tried antibiotics (nitrofuantoin) for the symptoms. The treatment provided mild relief. There is no history of catheterization, kidney stones, recurrent UTIs, a single kidney, urinary stasis or a urological procedure.      Treated  with nitrofurantoin x 5 days via Evisit  1 months ago... symptoms did not resolve.    Well controlled DM. Mounjaro . Also on Farxiga . Followe dby ENDO. Lab Results  Component Value Date   HGBA1C 6.0 (A) 07/21/2023    Relevant past medical, surgical, family and social history reviewed and updated as indicated. Interim medical history since our last visit reviewed. Allergies and medications reviewed and updated. Outpatient Medications Prior to Visit  Medication Sig Dispense Refill   aspirin 81 MG  tablet Take 81 mg by mouth daily.     carvedilol  (COREG ) 12.5 MG tablet TAKE 1 TABLET (12.5 MG TOTAL) BY MOUTH 2 (TWO) TIMES DAILY WITH A MEAL. FOR BLOOD PRESSURE. 180 tablet 3   Cholecalciferol (VITAMIN D3) 50 MCG (2000 UT) capsule Take 1 capsule (2,000 Units total) by mouth daily. 90 capsule 3   Continuous Glucose Sensor (FREESTYLE LIBRE 3 SENSOR) MISC Place 1 sensor on the skin every 14 days. Use to check glucose continuously 6 each 2   cyclobenzaprine  (FLEXERIL ) 10 MG tablet Take 1 tablet (10 mg total) by mouth 3 (three) times daily as needed for muscle spasms. 30 tablet 0   dapagliflozin  propanediol (FARXIGA ) 10 MG TABS tablet Take 1 tablet (10 mg total) by mouth daily before breakfast. 90 tablet 3   famotidine  (PEPCID ) 20 MG tablet TAKE 1 TABLET (20 MG TOTAL) BY MOUTH DAILY AS NEEDED FOR HEARTBURN OR INDIGESTION. 90 tablet 3   glucose blood (ONETOUCH VERIO) test strip Twice dAILY 100 each 12   insulin  degludec (TRESIBA  FLEXTOUCH) 100 UNIT/ML FlexTouch Pen Inject 10 Units into the skin daily.     Insulin  Pen Needle (BD PEN NEEDLE NANO 2ND GEN) 32G X 4 MM MISC 1 Device by Other route daily in the afternoon. USE DAILY AS DIRECTED 100 each 2   Lancets (ONETOUCH ULTRASOFT) lancets Use as instructed 30 each 1   levothyroxine  (SYNTHROID ) 75 MCG tablet Take 1 tablet (75 mcg total) by mouth daily  before breakfast. 90 tablet 3   losartan -hydrochlorothiazide (HYZAAR) 100-25 MG tablet TAKE 1 TABLET BY MOUTH EVERY DAY FOR BLOOD PRESSURE 90 tablet 3   nitrofurantoin, macrocrystal-monohydrate, (MACROBID) 100 MG capsule Take 100 mg by mouth 2 (two) times daily.     rosuvastatin  (CRESTOR ) 5 MG tablet Take 1 tablet (5 mg total) by mouth daily. 90 tablet 3   tirzepatide  (MOUNJARO ) 5 MG/0.5ML Pen Inject 5 mg into the skin once a week. 6 mL 3   No facility-administered medications prior to visit.     Per HPI unless specifically indicated in ROS section below Review of Systems  Constitutional:  Negative for  chills.  Gastrointestinal:  Negative for nausea and vomiting.  Genitourinary:  Positive for frequency and urgency. Negative for flank pain, hematuria and hesitancy.   Objective:  BP 122/70   Pulse 73   Temp 97.7 F (36.5 C) (Oral)   Ht 5' 5.2 (1.656 m)   Wt 170 lb 6 oz (77.3 kg)   SpO2 99%   BMI 28.18 kg/m   Wt Readings from Last 3 Encounters:  01/05/24 170 lb 6 oz (77.3 kg)  10/25/23 173 lb (78.5 kg)  07/21/23 178 lb (80.7 kg)      Physical Exam Constitutional:      General: She is not in acute distress.    Appearance: Normal appearance. She is well-developed. She is not ill-appearing or toxic-appearing.  HENT:     Head: Normocephalic.     Right Ear: Hearing, tympanic membrane, ear canal and external ear normal. Tympanic membrane is not erythematous, retracted or bulging.     Left Ear: Hearing, tympanic membrane, ear canal and external ear normal. Tympanic membrane is not erythematous, retracted or bulging.     Nose: No mucosal edema or rhinorrhea.     Right Sinus: No maxillary sinus tenderness or frontal sinus tenderness.     Left Sinus: No maxillary sinus tenderness or frontal sinus tenderness.     Mouth/Throat:     Pharynx: Uvula midline.  Eyes:     General: Lids are normal. Lids are everted, no foreign bodies appreciated.     Conjunctiva/sclera: Conjunctivae normal.     Pupils: Pupils are equal, round, and reactive to light.  Neck:     Thyroid : No thyroid  mass or thyromegaly.     Vascular: No carotid bruit.     Trachea: Trachea normal.  Cardiovascular:     Rate and Rhythm: Normal rate and regular rhythm.     Pulses: Normal pulses.     Heart sounds: Normal heart sounds, S1 normal and S2 normal. No murmur heard.    No friction rub. No gallop.  Pulmonary:     Effort: Pulmonary effort is normal. No tachypnea or respiratory distress.     Breath sounds: Normal breath sounds. No decreased breath sounds, wheezing, rhonchi or rales.  Abdominal:     General: Bowel sounds  are normal.     Palpations: Abdomen is soft.     Tenderness: There is no abdominal tenderness.  Musculoskeletal:     Cervical back: Normal range of motion and neck supple.  Skin:    General: Skin is warm and dry.     Findings: No rash.  Neurological:     Mental Status: She is alert.  Psychiatric:        Mood and Affect: Mood is not anxious or depressed.        Speech: Speech normal.        Behavior: Behavior  normal. Behavior is cooperative.        Thought Content: Thought content normal.        Judgment: Judgment normal.       Results for orders placed or performed in visit on 01/05/24  POCT urinalysis dipstick   Collection Time: 01/05/24  2:28 PM  Result Value Ref Range   Color, UA yellow    Clarity, UA clear    Glucose, UA Positive (A) Negative   Bilirubin, UA neg    Ketones, UA neg    Spec Grav, UA <=1.005 (A) 1.010 - 1.025   Blood, UA neg    pH, UA 7.5 5.0 - 8.0   Protein, UA Negative Negative   Urobilinogen, UA 0.2 0.2 or 1.0 E.U./dL   Nitrite, UA neg    Leukocytes, UA Negative Negative   Appearance     Odor      Assessment and Plan  Polyuria Assessment & Plan: Acute, recent presumed UTI treated via e-visit with Macrodantin x 5 days.  Patient states symptoms improved but did not resolve completely.  Now on the last few days she has had recurrence of symptoms although has been significantly better today.  Urinalysis unremarkable.  Will send urine for culture to make sure not partially treated UTI given very dilute sample.  Also recommended patient to push hydration especially water, consider cranberry tablets and avoid acidic foods/bladder irritation triggers.  Return and ER precautions provided.  Orders: -     POCT urinalysis dipstick    No follow-ups on file.   Greig Ring, MD

## 2024-01-06 LAB — URINE CULTURE
MICRO NUMBER:: 16923167
Result:: NO GROWTH
SPECIMEN QUALITY:: ADEQUATE

## 2024-01-09 ENCOUNTER — Ambulatory Visit: Payer: Self-pay | Admitting: Family Medicine

## 2024-01-10 ENCOUNTER — Other Ambulatory Visit: Payer: Self-pay

## 2024-01-23 ENCOUNTER — Ambulatory Visit: Admitting: Internal Medicine

## 2024-02-08 ENCOUNTER — Other Ambulatory Visit: Payer: Self-pay

## 2024-02-22 ENCOUNTER — Ambulatory Visit (INDEPENDENT_AMBULATORY_CARE_PROVIDER_SITE_OTHER): Admitting: Internal Medicine

## 2024-02-22 ENCOUNTER — Encounter: Payer: Self-pay | Admitting: Internal Medicine

## 2024-02-22 VITALS — BP 110/70 | HR 69 | Ht 65.2 in | Wt 162.0 lb

## 2024-02-22 DIAGNOSIS — E039 Hypothyroidism, unspecified: Secondary | ICD-10-CM

## 2024-02-22 DIAGNOSIS — E785 Hyperlipidemia, unspecified: Secondary | ICD-10-CM | POA: Diagnosis not present

## 2024-02-22 DIAGNOSIS — Z794 Long term (current) use of insulin: Secondary | ICD-10-CM

## 2024-02-22 DIAGNOSIS — E119 Type 2 diabetes mellitus without complications: Secondary | ICD-10-CM

## 2024-02-22 LAB — POCT GLYCOSYLATED HEMOGLOBIN (HGB A1C): Hemoglobin A1C: 6.4 % — AB (ref 4.0–5.6)

## 2024-02-22 MED ORDER — TRESIBA FLEXTOUCH 100 UNIT/ML ~~LOC~~ SOPN: 8.0000 [IU] | PEN_INJECTOR | Freq: Every day | SUBCUTANEOUS | Status: AC

## 2024-02-22 MED ORDER — FREESTYLE LIBRE 3 PLUS SENSOR MISC: 1.0000 | 3 refills | Status: AC

## 2024-02-22 MED ORDER — BD PEN NEEDLE NANO 2ND GEN 32G X 4 MM MISC
1.0000 | Freq: Every day | 2 refills | Status: AC
Start: 2024-02-22 — End: ?

## 2024-02-22 MED ORDER — LEVOTHYROXINE SODIUM 75 MCG PO TABS: 75.0000 ug | ORAL_TABLET | Freq: Every day | ORAL | 3 refills | Status: AC

## 2024-02-22 MED ORDER — TIRZEPATIDE 5 MG/0.5ML ~~LOC~~ SOAJ: 5.0000 mg | SUBCUTANEOUS | 3 refills | Status: DC

## 2024-02-22 MED ORDER — ROSUVASTATIN CALCIUM 5 MG PO TABS: 5.0000 mg | ORAL_TABLET | Freq: Every day | ORAL | 3 refills | Status: DC

## 2024-02-22 MED ORDER — DAPAGLIFLOZIN PROPANEDIOL 10 MG PO TABS: 10.0000 mg | ORAL_TABLET | Freq: Every day | ORAL | 3 refills | Status: AC

## 2024-02-22 NOTE — Progress Notes (Signed)
 Name: Melanie Navarro  MRN/ DOB: [id number], 03/19/1961   Age/ Sex: 63 y.o., female    PCP: Gretta Comer POUR, NP   Reason for Endocrinology Evaluation: Type 2 Diabetes Mellitus     Date of Initial Endocrinology Visit: 07/16/2020    PATIENT IDENTIFIER: Melanie Navarro is a 63 y.o. female with a past medical history of T2DM, HTN , hypothyroidism and Dyslipidemia. The patient presented for initial endocrinology clinic visit on 07/16/2020 for consultative assistance with her diabetes management.    DIABETIC HISTORY:  Ms. Struthers  was diagnosed with DM many years ago. Glipizide  - possible  rash . Intolerant to higher doses of metformin .  Her hemoglobin A1c has ranged from 8.2% in 2016, peaking at 12.8% in 2018.  On her initial visit to our clinic her A1c is 10.8 % she was on Levemir  , Metformin  and Jardiance , we witched her to Tresiba  and started Trulicity  as well as continuing with Metformin  and Jardiance    She was lost to follow-up in 2023 and when she returned she had not been on Trulicity  and I started her on Mounjaro  12/2022   THYROID  HISTORY : Has been diagnosed with hypothyroidism ~ 2020 Has been on Lt-4 since .  She is on Biotin  No local neck symptoms   NO Fh of thyroid  disease    SUBJECTIVE:   During the last visit (07/21/2023): A1c 6.0%     Today (07/16/2020): [name] is here for a follow up on diabetes management..She  checks her blood sugars  multiple times daily, through freestyle libre 3.  The patient has been noted with hypoglycemia.  Patient has been noted with weight loss She has retired , and now has been exercising more  Has noted mild constipation with diarrhea  She also noted epigastric discomfort when eating large meals No nausea or vomiting     HOME ENDOCRINE  REGIMEN: Tresiba   10 units  at night  Farxiga  10 mg daliy  Mounjaro  5 mg weekly  Levothyroxine  75 mcg daily  Rosuvastatin  5 mg daily   Statin: yes ACE-I/ARB: yes Prior Diabetic Education: no    CONTINUOUS GLUCOSE MONITORING RECORD INTERPRETATION    Dates of Recording: 10/9-10/22/2025  Sensor description: Freestyle libre  Results statistics:   CGM use % of time 87  Average and SD 134/26.9  Time in range 88 %  % Time Above 180 11  % Time above 250 0  % Time Below target 1   Glycemic patterns summary: BGs are optimal overnight and most of the day Hyperglycemic episodes postprandial  Hypoglycemic episodes occurred overnight  Overnight periods: Trends down    DIABETIC COMPLICATIONS: Microvascular complications:  Left eye DR ? Denies: CKD, neuropathy  Last eye exam: Completed 12/28/2023  Macrovascular complications:   Denies: CAD, PVD, CVA   PAST HISTORY: Past Medical History:  Past Medical History:  Diagnosis Date   Diabetes mellitus    History of echocardiogram    a. 07/2019 Echo: EF 60-65%. no rwma. Nl RV size/fxn. Mild Ao sclerosis w/o stenosis.    Hyperlipidemia    Hypertension    Nocturnal Bradycardia    a. 06/2019 Zio: Mobitz I progressing to CHB w/ up to 3.6 sec pause (2 episodes)-->sleep study pending.   Palpitations    a. 06/2019 Zio: Avg HR 72 (max/min 38/135). Rare PACs/PVCs. 9 atrial runs up to 9 beat/max 135 bpm. 2 brief episodes of Mobitz I progressing to CHB w/ longest pause of 3.7 secs - episodes occrred overnight/early  morning hrs.   Past Surgical History:  Past Surgical History:  Procedure Laterality Date   WISDOM TOOTH EXTRACTION  1984    Social History:  reports that she has never smoked. She has never used smokeless tobacco. She reports that she does not currently use alcohol. She reports that she does not use drugs. Family History:  Family History  Problem Relation Age of Onset   Stroke Mother    Hypertension Mother    Heart disease Mother    Diabetes Mother    Peripheral Artery Disease Mother    Diabetes Father    Lung cancer Father    Diabetes Brother    Heart attack Brother 3   Osteoarthritis Paternal Grandmother     Colon cancer Neg Hx      HOME MEDICATIONS: Allergies as of 02/22/2024       Reactions   Elemental Sulfur    Rash   Glipizide     dizziness   Penicillins    Rash   Codeine Swelling, Rash        Medication List        Accurate as of February 22, 2024  9:32 AM. If you have any questions, ask your nurse or doctor.          aspirin 81 MG tablet Take 81 mg by mouth daily.   BD Pen Needle Nano 2nd Gen 32G X 4 MM Misc Generic drug: Insulin  Pen Needle 1 Device by Other route daily in the afternoon. USE DAILY AS DIRECTED   carvedilol  12.5 MG tablet Commonly known as: COREG  TAKE 1 TABLET (12.5 MG TOTAL) BY MOUTH 2 (TWO) TIMES DAILY WITH A MEAL. FOR BLOOD PRESSURE.   cyclobenzaprine  10 MG tablet Commonly known as: FLEXERIL  Take 1 tablet (10 mg total) by mouth 3 (three) times daily as needed for muscle spasms.   dapagliflozin  propanediol 10 MG Tabs tablet Commonly known as: Farxiga  Take 1 tablet (10 mg total) by mouth daily before breakfast.   famotidine  20 MG tablet Commonly known as: PEPCID  TAKE 1 TABLET (20 MG TOTAL) BY MOUTH DAILY AS NEEDED FOR HEARTBURN OR INDIGESTION.   FreeStyle Libre 3 Sensor Misc Place 1 sensor on the skin every 14 days. Use to check glucose continuously   levothyroxine  75 MCG tablet Commonly known as: SYNTHROID  Take 1 tablet (75 mcg total) by mouth daily before breakfast.   losartan -hydrochlorothiazide 100-25 MG tablet Commonly known as: HYZAAR TAKE 1 TABLET BY MOUTH EVERY DAY FOR BLOOD PRESSURE   Mounjaro  5 MG/0.5ML Pen Generic drug: tirzepatide  Inject 5 mg into the skin once a week.   nitrofurantoin (macrocrystal-monohydrate) 100 MG capsule Commonly known as: MACROBID Take 100 mg by mouth 2 (two) times daily.   onetouch ultrasoft lancets Use as instructed   OneTouch Verio test strip Generic drug: glucose blood Twice dAILY   rosuvastatin  5 MG tablet Commonly known as: Crestor  Take 1 tablet (5 mg total) by mouth daily.    Tresiba  FlexTouch 100 UNIT/ML FlexTouch Pen Generic drug: insulin  degludec Inject 10 Units into the skin daily.   Vitamin D3 50 MCG (2000 UT) capsule Take 1 capsule (2,000 Units total) by mouth daily.         ALLERGIES: Allergies  Allergen Reactions   Elemental Sulfur     Rash    Glipizide      dizziness   Penicillins     Rash    Codeine Swelling and Rash     REVIEW OF SYSTEMS: A comprehensive ROS was conducted with  the patient and is negative except as per HPI     OBJECTIVE:   VITAL SIGNS: BP 110/70 (BP Location: Left Arm, Patient Position: Sitting, Cuff Size: Normal)   Pulse 69   Ht 5' 5.2 (1.656 m)   Wt 162 lb (73.5 kg)   SpO2 99%   BMI 26.79 kg/m    PHYSICAL EXAM:  General: Pt appears well and is in NAD  Neck: Thyroid : Thyroid  size normal.  No goiter or nodules appreciated.  Lungs: Clear with good BS bilat   Heart: RRR   Abdomen: Soft, nontender  Extremities:  Lower extremities - No pretibial edema.   Neuro: MS is good with appropriate affect, pt is alert and Ox3    DM foot exam: 02/22/2024  The skin of the feet is intact without sores or ulcerations. The pedal pulses are 2+ on right and 2+ on left. The sensation is intact to a screening 5.07, 10 gram monofilament bilaterally   DATA REVIEWED:  Lab Results  Component Value Date   HGBA1C 6.0 (A) 07/21/2023   HGBA1C 8.4 (A) 12/30/2022   HGBA1C 6.6 (A) 03/25/2021    Latest Reference Range & Units 10/25/23 11:30  Alkaline Phosphatase 39 - 117 U/L 64  Albumin 3.5 - 5.2 g/dL 4.2  AST 0 - 37 U/L 16  ALT 0 - 35 U/L 15  Total Protein 6.0 - 8.3 g/dL 6.7  Bilirubin, Direct 0.0 - 0.3 mg/dL 0.1  Total Bilirubin 0.2 - 1.2 mg/dL 0.6  Total CHOL/HDL Ratio  2  Cholesterol 0 - 200 mg/dL 831  HDL Cholesterol >60.99 mg/dL 32.29  LDL (calc) 0 - 99 mg/dL 79  NonHDL  899.51  Triglycerides 0.0 - 149.0 mg/dL 890.9  VLDL 0.0 - 59.9 mg/dL 78.1      Latest Reference Range & Units 07/21/23 08:57  TSH 0.40  - 4.50 mIU/L 2.76    Latest Reference Range & Units 07/21/23 08:57  Microalb, Ur mg/dL 0.2  MICROALB/CREAT RATIO <30 mg/g creat 3  Creatinine, Urine 20 - 275 mg/dL 59     ASSESSMENT / PLAN / RECOMMENDATIONS:   1) Type 2 Diabetes Mellitus,Optimally controlled, Without complications - Most recent A1c of 6.4%. Goal A1c < 7.0 %.     -A1c remains optimal -We switch Trulicity  to Mounjaro  2024 due to insurance issues - Intolerant to Metformin  XR -Patient would like to remain on current dose of Mounjaro  -I did recommend discontinuing Tresiba , but she is not comfortable with this at this time, she will decrease to 8 units, patient advised that if she were to have any more hypoglycemic episodes on Tresiba  8 units she should discontinue Tresiba . - Patient admits to snacking more since retirement, patient is trying to do intermittent fasting by skipping breakfast, but unfortunately she continues to snack.  I did advise the patient that I would rather her eat breakfast but discontinue snacking   MEDICATIONS:  - Decrease Tresiba  8 units daily - Continue Farxiga   10 mg , 1 tablet in the morning  - Continue Mounjaro  5 mg weekly  EDUCATION / INSTRUCTIONS: BG monitoring instructions: Patient is instructed to check her blood sugars 1 times a day, fasting  Call [facility] if: BG persistently < 70  I reviewed the Rule of 15 for the treatment of hypoglycemia in detail with the patient. Literature supplied.   2) Diabetic complications:  Eye: Does not have known diabetic retinopathy.  Neuro/ Feet: Does not have known diabetic peripheral neuropathy. Renal: Patient does not have  known baseline CKD. She is on an ACEI/ARB at present.      3) Dyslipidemia:   -LDL has trended down to 99 mg/DL 08/7973 - She is intolerant to pravastatin  due to myalgia  -She is not on Zetia  as it has been cost prohibitive - I started her on rosuvastatin  in March, 2025 with an LDL of 122 MGs/DL and  normal triglycerides at 93 MGs/DL -Repeat lipid panel in June, 2025 showed an LDL of 79 MGs/DL and triglycerides of 890 MGs/DL - No change    Medication Continue rosuvastatin  5 mg at bedtime  4) Hypothyroidism :  -Patient is clinically euthyroid - TSH has been normal, no change   Medication  Continue Levothyroxine  75 mcg daily     F/U in 6 months   Signed electronically by: Stefano Redgie Butts, MD  [facility] End[facility]  [facility] [address] [place] [place], [place] [address] Phone: [phone] FAX: [phone]   CC: Gretta Comer POUR, NP [address] [place] [address] Phone: [phone]  Fax: [phone]    Return to Endocrinology clinic as below: No future appointments.

## 2024-02-22 NOTE — Patient Instructions (Signed)
-   Continue Mounjaro  5 mg weekly  - Decrease Tresiba  8  units daily  - Continue Farxiga  10 mg , 1 tablet in the morning     HOW TO TREAT LOW BLOOD SUGARS (Blood sugar LESS THAN 70 MG/DL) Please follow the RULE OF 15 for the treatment of hypoglycemia treatment (when your (blood sugars are less than 70 mg/dL)   STEP 1: Take 15 grams of carbohydrates when your blood sugar is low, which includes:  3-4 GLUCOSE TABS  OR 3-4 OZ OF JUICE OR REGULAR SODA OR ONE TUBE OF GLUCOSE GEL    STEP 2: RECHECK blood sugar in 15 MINUTES STEP 3: If your blood sugar is still low at the 15 minute recheck --> then, go back to STEP 1 and treat AGAIN with another 15 grams of carbohydrates

## 2024-02-24 ENCOUNTER — Ambulatory Visit: Admitting: Internal Medicine

## 2024-03-06 ENCOUNTER — Other Ambulatory Visit: Payer: Self-pay | Admitting: Family Medicine

## 2024-03-08 ENCOUNTER — Other Ambulatory Visit: Payer: Self-pay

## 2024-03-08 ENCOUNTER — Other Ambulatory Visit: Payer: Self-pay | Admitting: Internal Medicine

## 2024-03-09 ENCOUNTER — Other Ambulatory Visit: Payer: Self-pay

## 2024-03-09 DIAGNOSIS — Z794 Long term (current) use of insulin: Secondary | ICD-10-CM

## 2024-03-09 MED ORDER — TIRZEPATIDE 5 MG/0.5ML ~~LOC~~ SOAJ
5.0000 mg | SUBCUTANEOUS | 3 refills | Status: DC
  Filled 2024-03-09 (×2): qty 2, 28d supply, fill #0

## 2024-03-13 ENCOUNTER — Other Ambulatory Visit: Payer: Self-pay

## 2024-03-13 MED ORDER — ROSUVASTATIN CALCIUM 5 MG PO TABS: 5.0000 mg | ORAL_TABLET | Freq: Every day | ORAL | Status: AC

## 2024-03-13 MED ORDER — TIRZEPATIDE 2.5 MG/0.5ML ~~LOC~~ SOAJ
2.5000 mg | SUBCUTANEOUS | 3 refills | Status: AC
  Filled 2024-03-13 – 2024-04-03 (×2): qty 2, 28d supply, fill #0
  Filled 2024-05-01: qty 2, 28d supply, fill #1

## 2024-03-13 NOTE — Addendum Note (Signed)
 Addended by: SAM DONELL PARAS on: 03/13/2024 10:35 AM   Modules accepted: Orders

## 2024-03-23 ENCOUNTER — Other Ambulatory Visit: Payer: Self-pay

## 2024-04-03 ENCOUNTER — Other Ambulatory Visit: Payer: Self-pay

## 2024-04-04 ENCOUNTER — Other Ambulatory Visit: Payer: Self-pay

## 2024-05-01 ENCOUNTER — Other Ambulatory Visit: Payer: Self-pay

## 2024-06-06 ENCOUNTER — Ambulatory Visit: Payer: Self-pay | Admitting: Primary Care

## 2024-06-06 ENCOUNTER — Ambulatory Visit: Admitting: Primary Care

## 2024-06-06 ENCOUNTER — Encounter: Payer: Self-pay | Admitting: Primary Care

## 2024-06-06 VITALS — BP 116/78 | HR 55 | Temp 97.9°F | Ht 65.2 in | Wt 158.4 lb

## 2024-06-06 DIAGNOSIS — R1011 Right upper quadrant pain: Secondary | ICD-10-CM | POA: Insufficient documentation

## 2024-06-06 LAB — COMPREHENSIVE METABOLIC PANEL WITH GFR
ALT: 17 U/L (ref 3–35)
AST: 18 U/L (ref 5–37)
Albumin: 4.2 g/dL (ref 3.5–5.2)
Alkaline Phosphatase: 56 U/L (ref 39–117)
BUN: 16 mg/dL (ref 6–23)
CO2: 29 meq/L (ref 19–32)
Calcium: 9.5 mg/dL (ref 8.4–10.5)
Chloride: 102 meq/L (ref 96–112)
Creatinine, Ser: 1.02 mg/dL (ref 0.40–1.20)
GFR: 58.07 mL/min — ABNORMAL LOW
Glucose, Bld: 145 mg/dL — ABNORMAL HIGH (ref 70–99)
Potassium: 3.4 meq/L — ABNORMAL LOW (ref 3.5–5.1)
Sodium: 140 meq/L (ref 135–145)
Total Bilirubin: 0.7 mg/dL (ref 0.2–1.2)
Total Protein: 6.8 g/dL (ref 6.0–8.3)

## 2024-06-06 NOTE — Progress Notes (Signed)
 "  Subjective:    Patient ID: Melanie Navarro, female    DOB: November 05, 1960, 64 y.o.   MRN: [id number]  Melanie Navarro is a very pleasant 64 y.o. female with a history of hypertension, GERD, type 2 diabetes, hypothyroidism, chronic back pain who presents today to discuss stool changes.   She messaged us  via MyChart on 06/01/24 for reports of stool changes from diarrhea to loose mushy stools with intermittent pale yellow color. She was asked to come in for evaluation.  Symptoms are intermittent, occurring for about 1 month. She's also noticed RU abdominal pain which occurs soon after eating in the evening with radiation to the right flank. She discussed symptoms with her endocrinologist who reduced her dose of Mounjaro  to 2.5 mg from 5 mg. Since the dose reduction she's not noticed improvement in symptoms. She has increased fiber in her diet and taking Citrucel.   She denies fevers, chills, rectal bleeding. She's been taking Imodium which helps. She has a colonoscopy scheduled for 06/19/24. She discontinued her Mounjaro  2 weeks ago and symptoms haven't changed. She feels that her symptoms have progressed.   She has a family history of gall bladder disease in both her mother and brother. Her brother had his gallbladder removed less than 2 years ago.   Wt Readings from Last 3 Encounters:  06/06/24 158 lb 6 oz (71.8 kg)  02/22/24 162 lb (73.5 kg)  01/05/24 170 lb 6 oz (77.3 kg)      Review of Systems  Constitutional:  Negative for chills, fever and unexpected weight change.  Gastrointestinal:  Positive for diarrhea. Negative for abdominal pain and rectal pain.         Past Medical History:  Diagnosis Date   Diabetes mellitus    History of echocardiogram    a. 07/2019 Echo: EF 60-65%. no rwma. Nl RV size/fxn. Mild Ao sclerosis w/o stenosis.    Hyperlipidemia    Hypertension    Nocturnal Bradycardia    a. 06/2019 Zio: Mobitz I progressing to CHB w/ up to 3.6 sec pause (2 episodes)-->sleep  study pending.   Palpitations    a. 06/2019 Zio: Avg HR 72 (max/min 38/135). Rare PACs/PVCs. 9 atrial runs up to 9 beat/max 135 bpm. 2 brief episodes of Mobitz I progressing to CHB w/ longest pause of 3.7 secs - episodes occrred overnight/early morning hrs.    Social History   Socioeconomic History   Marital status: Married    Spouse name: Not on file   Number of children: Not on file   Years of education: Not on file   Highest education level: 12th grade  Occupational History   Not on file  Tobacco Use   Smoking status: Never   Smokeless tobacco: Never  Vaping Use   Vaping status: Never Used  Substance and Sexual Activity   Alcohol use: Not Currently    Comment: 1 drink per month   Drug use: No   Sexual activity: Not on file  Other Topics Concern   Not on file  Social History Narrative   Married.   2 children. 1 grandchild.   Works in photographer.   Enjoys gardening, flowers, spending time with family.   Social Drivers of Health   Tobacco Use: Low Risk (06/06/2024)   Patient History    Smoking Tobacco Use: Never    Smokeless Tobacco Use: Never    Passive Exposure: Not on file  Financial Resource Strain: Low Risk (09/23/2022)   Overall Financial Resource Strain (  CARDIA)    Difficulty of Paying Living Expenses: Not very hard  Food Insecurity: No Food Insecurity (09/23/2022)   Hunger Vital Sign    Worried About Running Out of Food in the Last Year: Never true    Ran Out of Food in the Last Year: Never true  Transportation Needs: No Transportation Needs (09/23/2022)   PRAPARE - Administrator, Civil Service (Medical): No    Lack of Transportation (Non-Medical): No  Physical Activity: Insufficiently Active (09/23/2022)   Exercise Vital Sign    Days of Exercise per Week: 1 day    Minutes of Exercise per Session: 10 min  Stress: Stress Concern Present (09/23/2022)   [employer] of Occupational Health - Occupational Stress Questionnaire    Feeling of Stress : To  some extent  Social Connections: Socially Integrated (09/23/2022)   Social Connection and Isolation Panel    Frequency of Communication with Friends and Family: Twice a week    Frequency of Social Gatherings with Friends and Family: Twice a week    Attends Religious Services: 1 to 4 times per year    Active Member of [employer] or Organizations: Yes    Attends Banker Meetings: 1 to 4 times per year    Marital Status: Married  Catering Manager Violence: Unknown (08/07/2021)   Received from [facility]   HITS    Physically Hurt: Not on file    Insult or Talk Down To: Not on file    Threaten Physical Harm: Not on file    Scream or Curse: Not on file  Depression (PHQ2-9): Low Risk (06/06/2024)   Depression (PHQ2-9)    PHQ-2 Score: 0  Alcohol Screen: Low Risk (09/23/2022)   Alcohol Screen    Last Alcohol Screening Score (AUDIT): 2  Housing: Unknown (01/11/2024)   Received from [facility]   Epic    Unable to Pay for Housing in the Last Year: Not on file    Number of Times Moved in the Last Year: Not on file    At any time in the past 12 months, were you homeless or living in a shelter (including now)?: No  Utilities: Not on file  Health Literacy: Not on file    Past Surgical History:  Procedure Laterality Date   WISDOM TOOTH EXTRACTION  1984    Family History  Problem Relation Age of Onset   Stroke Mother    Hypertension Mother    Heart disease Mother    Diabetes Mother    Peripheral Artery Disease Mother    Diabetes Father    Lung cancer Father    Diabetes Brother    Heart attack Brother 59   Osteoarthritis Paternal Grandmother    Colon cancer Neg Hx     Allergies[1]  Medications Ordered Prior to Encounter[2]  BP 116/78   Pulse (!) 55   Temp 97.9 F (36.6 C) (Oral)   Ht 5' 5.2 (1.656 m)   Wt 158 lb 6 oz (71.8 kg)   SpO2 100%   BMI 26.19 kg/m  Objective:   Physical Exam Cardiovascular:     Rate and Rhythm: Normal rate and regular  rhythm.  Pulmonary:     Effort: Pulmonary effort is normal.     Breath sounds: Normal breath sounds.  Abdominal:     General: Bowel sounds are normal.     Palpations: Abdomen is soft.     Tenderness: There is no abdominal tenderness. There is no guarding.  Negative signs include Murphy's sign.  Musculoskeletal:     Cervical back: Neck supple.  Skin:    General: Skin is warm and dry.  Neurological:     Mental Status: She is alert and oriented to person, place, and time.  Psychiatric:        Mood and Affect: Mood normal.     Physical Exam        Assessment & Plan:  RUQ abdominal pain Assessment & Plan: Symptoms suggestive of cholelithiasis. No acute cholecystitis today No alarm signs on exam.  RUQ abdominal US  ordered and pending.  CMP pending.  Discussed to start a BRAT diet and avoid fatty/greasy foods.  Discussed to use Imodium sparingly if needed.  Avoid Imodium 1 week before colonoscopy  Orders: -     US  ABDOMEN LIMITED RUQ (LIVER/GB); Future -     Comprehensive metabolic panel with GFR    Assessment and Plan Assessment & Plan         Comer MARLA Gaskins, NP       [1]  Allergies Allergen Reactions   Elemental Sulfur     Rash    Glipizide      dizziness   Penicillins     Rash    Codeine Swelling and Rash  [2]  Current Outpatient Medications on File Prior to Visit  Medication Sig Dispense Refill   aspirin 81 MG tablet Take 81 mg by mouth daily.     carvedilol  (COREG ) 12.5 MG tablet TAKE 1 TABLET (12.5 MG TOTAL) BY MOUTH 2 (TWO) TIMES DAILY WITH A MEAL. FOR BLOOD PRESSURE. 180 tablet 3   Cholecalciferol (VITAMIN D3) 50 MCG (2000 UT) capsule Take 1 capsule (2,000 Units total) by mouth daily. 90 capsule 3   Continuous Glucose Sensor (FREESTYLE LIBRE 3 PLUS SENSOR) MISC 1 Device by Other route every 14 (fourteen) days. Change sensor every 15 days. 6 each 3   cyclobenzaprine  (FLEXERIL ) 10 MG tablet Take 1 tablet (10 mg total) by mouth 3 (three)  times daily as needed for muscle spasms. 30 tablet 0   dapagliflozin  propanediol (FARXIGA ) 10 MG TABS tablet Take 1 tablet (10 mg total) by mouth daily before breakfast. 90 tablet 3   famotidine  (PEPCID ) 20 MG tablet TAKE 1 TABLET (20 MG TOTAL) BY MOUTH DAILY AS NEEDED FOR HEARTBURN OR INDIGESTION. 90 tablet 3   glucose blood (ONETOUCH VERIO) test strip Twice dAILY 100 each 12   insulin  degludec (TRESIBA  FLEXTOUCH) 100 UNIT/ML FlexTouch Pen Inject 8 Units into the skin daily.     Insulin  Pen Needle (BD PEN NEEDLE NANO 2ND GEN) 32G X 4 MM MISC 1 Device by Other route daily in the afternoon. USE DAILY AS DIRECTED 100 each 2   Lancets (ONETOUCH ULTRASOFT) lancets Use as instructed 30 each 1   levothyroxine  (SYNTHROID ) 75 MCG tablet Take 1 tablet (75 mcg total) by mouth daily before breakfast. 90 tablet 3   losartan -hydrochlorothiazide (HYZAAR) 100-25 MG tablet TAKE 1 TABLET BY MOUTH EVERY DAY FOR BLOOD PRESSURE 90 tablet 3   nitrofurantoin, macrocrystal-monohydrate, (MACROBID) 100 MG capsule Take 100 mg by mouth 2 (two) times daily.     rosuvastatin  (CRESTOR ) 5 MG tablet Take 1 tablet (5 mg total) by mouth daily.     tirzepatide  (MOUNJARO ) 2.5 MG/0.5ML Pen Inject 2.5 mg into the skin once a week. 6 mL 3   No current facility-administered medications on file prior to visit.   "

## 2024-06-06 NOTE — Patient Instructions (Signed)
 Stop by the lab prior to leaving today. I will notify you of your results once received.   You will receive a phone call regarding the ultrasound of your abdomen.  Avoid greasy, fatty, spicy foods.  Focus on a bland diet.  It was a pleasure to see you today!

## 2024-06-06 NOTE — Assessment & Plan Note (Addendum)
 Symptoms suggestive of cholelithiasis. No acute cholecystitis today No alarm signs on exam.  RUQ abdominal US  ordered and pending.  CMP pending.  Discussed to start a BRAT diet and avoid fatty/greasy foods.  Discussed to use Imodium sparingly if needed.  Avoid Imodium 1 week before colonoscopy

## 2024-06-06 NOTE — Addendum Note (Signed)
 Addended by: Fontaine Kossman K on: 06/06/2024 11:11 AM   Modules accepted: Level of Service

## 2024-06-11 ENCOUNTER — Ambulatory Visit

## 2024-08-22 ENCOUNTER — Ambulatory Visit: Admitting: Internal Medicine
